# Patient Record
Sex: Female | Born: 1960 | Race: White | Hispanic: No | Marital: Married | Smoking: Never smoker
Health system: Southern US, Community
[De-identification: ages and names within clinical notes are randomized; demographics above are authoritative.]

## PROBLEM LIST (undated history)

## (undated) DIAGNOSIS — K222 Esophageal obstruction: Secondary | ICD-10-CM

## (undated) DIAGNOSIS — C4490 Unspecified malignant neoplasm of skin, unspecified: Secondary | ICD-10-CM

## (undated) DIAGNOSIS — K900 Celiac disease: Secondary | ICD-10-CM

## (undated) DIAGNOSIS — T7840XA Allergy, unspecified, initial encounter: Secondary | ICD-10-CM

## (undated) DIAGNOSIS — K635 Polyp of colon: Secondary | ICD-10-CM

## (undated) DIAGNOSIS — E079 Disorder of thyroid, unspecified: Secondary | ICD-10-CM

## (undated) DIAGNOSIS — K219 Gastro-esophageal reflux disease without esophagitis: Secondary | ICD-10-CM

## (undated) DIAGNOSIS — J45909 Unspecified asthma, uncomplicated: Secondary | ICD-10-CM

## (undated) DIAGNOSIS — K2950 Unspecified chronic gastritis without bleeding: Secondary | ICD-10-CM

## (undated) DIAGNOSIS — F419 Anxiety disorder, unspecified: Secondary | ICD-10-CM

## (undated) DIAGNOSIS — G43909 Migraine, unspecified, not intractable, without status migrainosus: Secondary | ICD-10-CM

## (undated) HISTORY — DX: Allergy, unspecified, initial encounter: T78.40XA

## (undated) HISTORY — DX: Anxiety disorder, unspecified: F41.9

## (undated) HISTORY — DX: Unspecified asthma, uncomplicated: J45.909

## (undated) HISTORY — DX: Migraine, unspecified, not intractable, without status migrainosus: G43.909

## (undated) HISTORY — PX: UPPER GASTROINTESTINAL ENDOSCOPY: SHX188

## (undated) HISTORY — PX: POLYPECTOMY: SHX149

## (undated) HISTORY — DX: Unspecified chronic gastritis without bleeding: K29.50

## (undated) HISTORY — DX: Disorder of thyroid, unspecified: E07.9

## (undated) HISTORY — DX: Celiac disease: K90.0

## (undated) HISTORY — DX: Esophageal obstruction: K22.2

## (undated) HISTORY — DX: Polyp of colon: K63.5

## (undated) HISTORY — DX: Unspecified malignant neoplasm of skin, unspecified: C44.90

## (undated) HISTORY — DX: Gastro-esophageal reflux disease without esophagitis: K21.9

---
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
## 1996-05-28 HISTORY — PX: TUBAL LIGATION: SHX77

## 1999-09-06 ENCOUNTER — Other Ambulatory Visit: Admission: RE | Admit: 1999-09-06 | Discharge: 1999-09-06 | Payer: Self-pay | Admitting: *Deleted

## 2001-08-25 ENCOUNTER — Other Ambulatory Visit: Admission: RE | Admit: 2001-08-25 | Discharge: 2001-08-25 | Payer: Self-pay | Admitting: Obstetrics and Gynecology

## 2004-06-15 ENCOUNTER — Ambulatory Visit: Payer: Self-pay | Admitting: Internal Medicine

## 2009-05-28 HISTORY — PX: COLONOSCOPY: SHX174

## 2010-05-01 LAB — HM COLONOSCOPY: HM Colonoscopy: NEGATIVE

## 2010-05-28 HISTORY — PX: DILATION AND CURETTAGE OF UTERUS: SHX78

## 2010-05-28 HISTORY — PX: ABLATION: SHX5711

## 2010-09-28 LAB — PROTIME-INR

## 2011-03-02 LAB — HM PAP SMEAR: HM Pap smear: NEGATIVE

## 2012-01-31 ENCOUNTER — Ambulatory Visit (INDEPENDENT_AMBULATORY_CARE_PROVIDER_SITE_OTHER): Payer: BC Managed Care – PPO | Admitting: Family Medicine

## 2012-01-31 ENCOUNTER — Encounter: Payer: Self-pay | Admitting: Family Medicine

## 2012-01-31 VITALS — BP 112/62 | HR 72 | Temp 98.0°F | Resp 12 | Ht 65.0 in | Wt 138.0 lb

## 2012-01-31 DIAGNOSIS — K900 Celiac disease: Secondary | ICD-10-CM | POA: Insufficient documentation

## 2012-01-31 DIAGNOSIS — F410 Panic disorder [episodic paroxysmal anxiety] without agoraphobia: Secondary | ICD-10-CM | POA: Insufficient documentation

## 2012-01-31 DIAGNOSIS — J4520 Mild intermittent asthma, uncomplicated: Secondary | ICD-10-CM | POA: Insufficient documentation

## 2012-01-31 DIAGNOSIS — E039 Hypothyroidism, unspecified: Secondary | ICD-10-CM | POA: Insufficient documentation

## 2012-01-31 DIAGNOSIS — J45909 Unspecified asthma, uncomplicated: Secondary | ICD-10-CM

## 2012-01-31 DIAGNOSIS — K219 Gastro-esophageal reflux disease without esophagitis: Secondary | ICD-10-CM

## 2012-01-31 NOTE — Progress Notes (Signed)
  Subjective:    Patient ID: Vicki [name], female    DOB: 02/13/1961, 51 y.o.   MRN: [id number]  HPI Here to establish care. Past history significant for mild intermittent asthma, history of GERD, seasonal allergies, hypothyroidism, and celiac disease. This was diagnosed 2 years ago following endoscopy biopsy. Since going gluten-free she's felt much better. She continues to see gynecologist regularly. Had D&C this past January for heavy bleeding and that is improved. Also his history reported panic attacks and has been able to taper her down her Paxil to only 5 mg and that is currently controlling things well.  Patient nonsmoker. Walks regularly for exercise.  She is married and has 4 children. She has Masters degree in nursing. Has had previous Pneumovax. Tetanus 2004  Family history significant for both parents with alcoholism. Mother with history breast cancer. Father had stroke.     Review of Systems  Constitutional: Negative for fever, activity change, appetite change, fatigue and unexpected weight change.  HENT: Negative for hearing loss, ear pain, sore throat and trouble swallowing.   Eyes: Negative for visual disturbance.  Respiratory: Negative for cough and shortness of breath.   Cardiovascular: Negative for chest pain and palpitations.  Gastrointestinal: Negative for abdominal pain, diarrhea, constipation and blood in stool.  Genitourinary: Negative for dysuria and hematuria.  Musculoskeletal: Negative for myalgias, back pain and arthralgias.  Skin: Negative for rash.  Neurological: Negative for dizziness, syncope and headaches.  Hematological: Negative for adenopathy.  Psychiatric/Behavioral: Negative for confusion and dysphoric mood.       Objective:   Physical Exam  Constitutional: She is oriented to person, place, and time. She appears well-developed and well-nourished.  Neck: Neck supple. No thyromegaly present.  Cardiovascular: Normal rate and regular rhythm.   No  murmur heard. Pulmonary/Chest: Effort normal and breath sounds normal. No respiratory distress. She has no wheezes. She has no rales.  Musculoskeletal: She exhibits no edema.  Neurological: She is alert and oriented to person, place, and time.          Assessment & Plan:  #1 history of mild intermittent asthma. Stable. Continue yearly flu vaccine #2 history of celiac disease. Patient stable on gluten-free diet  #3 history of GERD stable on low-dose Prevacid #4 history of panic disorder stable on low-dose Paxil  #5 hypothyroidism. Recheck TSH

## 2012-02-01 NOTE — Progress Notes (Signed)
Quick Note:  Called and spoke with pt and pt is aware. ______ 

## 2012-03-24 ENCOUNTER — Telehealth: Payer: Self-pay | Admitting: Family Medicine

## 2012-03-24 MED ORDER — PAROXETINE HCL 10 MG PO TABS: 10.0000 mg | ORAL_TABLET | ORAL | Status: DC

## 2012-03-24 MED ORDER — MOMETASONE FUROATE 50 MCG/ACT NA SUSP: 2.0000 | Freq: Every day | NASAL | Status: DC

## 2012-03-24 MED ORDER — TRAMADOL HCL 50 MG PO TABS: 50.0000 mg | ORAL_TABLET | Freq: Four times a day (QID) | ORAL | Status: DC | PRN

## 2012-03-24 MED ORDER — LEVOTHYROXINE SODIUM 25 MCG PO TABS: 25.0000 ug | ORAL_TABLET | Freq: Every day | ORAL | Status: DC

## 2012-03-24 NOTE — Telephone Encounter (Signed)
Pt needs refills on:  mometasone (NASONEX) 50 MCG/ACT nasal spray (BRAND NAME ONLY - has Celiac disease)  traMADol (ULTRAM) 50 MG tablet (BRAND NAME ONLY - has Celiac disease).   Nasonex will probably need PA so let me know on that. Patient is almost out of these 2 meds, so hopes they can be done this week.  Will also need refills on these in next 30 days:  levothyroxine (SYNTHROID, LEVOTHROID) 25 MCG tablet (BRAND NAME ONLY - Celiac disease) PARoxetine (PAXIL) 10 MG tablet (BRAND NAME)  Patient was seen here 01/31/2012 as new pt, so Dr. Elease Hashimoto has not rx'd these before. Pt uses: [facility] in [place].

## 2012-04-02 LAB — HM MAMMOGRAPHY: HM Mammogram: NEGATIVE

## 2012-04-03 ENCOUNTER — Encounter: Payer: Self-pay | Admitting: Family Medicine

## 2012-05-30 ENCOUNTER — Encounter: Payer: Self-pay | Admitting: Family Medicine

## 2012-05-30 ENCOUNTER — Ambulatory Visit (INDEPENDENT_AMBULATORY_CARE_PROVIDER_SITE_OTHER): Payer: BC Managed Care – PPO | Admitting: Family Medicine

## 2012-05-30 VITALS — BP 110/60 | Temp 98.4°F | Wt 138.0 lb

## 2012-05-30 DIAGNOSIS — J019 Acute sinusitis, unspecified: Secondary | ICD-10-CM

## 2012-05-30 MED ORDER — AZITHROMYCIN 250 MG PO TABS: ORAL_TABLET | ORAL | Status: AC

## 2012-05-30 NOTE — Patient Instructions (Addendum)

## 2012-05-30 NOTE — Progress Notes (Signed)
  Subjective:    Patient ID: Vicki [name], female    DOB: 1960-11-10, 52 y.o.   MRN: [id number]  HPI Sinus congestion for over 2 weeks.   patient has intermittent headaches and left maxillary facial pressure. Fleeting vertigo symptoms off and on. She had gastrointestinal bug last week and those symptoms have fully cleared. She's had some fatigue. Intermittent headaches. No cough. Frequent postnasal drip. She's tried over-the-counter things like Mucinex and nasal saline irrigation without much improvement.   Review of Systems As per history of present illness    Objective:   Physical Exam  Constitutional: She appears well-developed and well-nourished.  HENT:  Right Ear: External ear normal.  Left Ear: External ear normal.  Nose: Nose normal.  Mouth/Throat: Oropharynx is clear and moist.  Neck: Neck supple. No thyromegaly present.  Cardiovascular: Normal rate and regular rhythm.   Pulmonary/Chest: Effort normal and breath sounds normal. No respiratory distress. She has no wheezes. She has no rales.          Assessment & Plan:  Acute sinusitis. Zithromax. Continue Mucinex. Followup as needed

## 2012-08-15 ENCOUNTER — Ambulatory Visit (INDEPENDENT_AMBULATORY_CARE_PROVIDER_SITE_OTHER): Payer: BC Managed Care – PPO | Admitting: Family Medicine

## 2012-08-15 ENCOUNTER — Encounter: Payer: Self-pay | Admitting: Family Medicine

## 2012-08-15 VITALS — BP 120/70 | Temp 98.3°F

## 2012-08-15 DIAGNOSIS — J029 Acute pharyngitis, unspecified: Secondary | ICD-10-CM

## 2012-08-15 NOTE — Patient Instructions (Addendum)
Upper Respiratory Infection, Adult An upper respiratory infection (URI) is also known as the common cold. It is often caused by a type of germ (virus). Colds are easily spread (contagious). You can pass it to others by kissing, coughing, sneezing, or drinking out of the same glass. Usually, you get better in 1 or 2 weeks.  HOME CARE   Only take medicine as told by your doctor.  Use a warm mist humidifier or breathe in steam from a hot shower.  Drink enough water and fluids to keep your pee (urine) clear or pale yellow.  Get plenty of rest.  Return to work when your temperature is back to normal or as told by your doctor. You may use a face mask and wash your hands to stop your cold from spreading. GET HELP RIGHT AWAY IF:   After the first few days, you feel you are getting worse.  You have questions about your medicine.  You have chills, shortness of breath, or brown or red spit (mucus).  You have yellow or brown snot (nasal discharge) or pain in the face, especially when you bend forward.  You have a fever, puffy (swollen) neck, pain when you swallow, or white spots in the back of your throat.  You have a bad headache, ear pain, sinus pain, or chest pain.  You have a high-pitched whistling sound when you breathe in and out (wheezing).  You have a lasting cough or cough up blood.  You have sore muscles or a stiff neck. MAKE SURE YOU:   Understand these instructions.  Will watch your condition.  Will get help right away if you are not doing well or get worse. Document Released: 10/31/2007 Document Revised: 08/06/2011 Document Reviewed: 09/18/2010 [facility] Patient Information 2013 [place].

## 2012-08-15 NOTE — Progress Notes (Signed)
  Subjective:    Patient ID: Vicki [name], female    DOB: 02-06-1961, 52 y.o.   MRN: [id number]  HPI Acute visit. Onset of sore throat 1 week ago. Possible referred pain to left ear. She's had some mild nasal congestion but no cough. No wheezing. Increased malaise. No headaches. Denies any skin rash. No nausea or vomiting. No sick contacts. Alleviated with over-the-counter medications somewhat. Sore throat slightly improved compared to earlier in the week.  Past Medical History  Diagnosis Date  . Asthma   . Allergy   . Thyroid disease   . Celiac disease   . Anxiety     hx ?panic disorder   Past Surgical History  Procedure Laterality Date  . Cesarean section      reports that she has never smoked. She does not have any smokeless tobacco history on file. Her alcohol and drug histories are not on file. family history includes Alcohol abuse in her father and mother; Arthritis in her maternal grandmother; Asthma in her paternal aunt; Cancer in her mother; and Stroke in her father.  There is no history of Heart disease. Allergies  Allergen Reactions  . Ceclor (Cefaclor)     hives      Review of Systems  Constitutional: Positive for fatigue. Negative for fever and chills.  HENT: Positive for sore throat. Negative for trouble swallowing.   Respiratory: Negative for cough.   Skin: Negative for rash.  Neurological: Negative for headaches.  Hematological: Negative for adenopathy.       Objective:   Physical Exam  Constitutional: She appears well-developed and well-nourished.  HENT:  Right Ear: External ear normal.  Left Ear: External ear normal.  Mouth/Throat: Oropharynx is clear and moist.  Neck: Neck supple.  Cardiovascular: Normal rate and regular rhythm.   Pulmonary/Chest: Effort normal and breath sounds normal. No respiratory distress. She has no wheezes. She has no rales.  Lymphadenopathy:    She has no cervical adenopathy.  Skin: No rash noted.           Assessment & Plan:  Pharyngitis. Suspect viral. She does not have any fever, exudate, adenopathy to suggest strep. Reassurance and over-the-counter medications as needed.

## 2013-01-09 ENCOUNTER — Ambulatory Visit (INDEPENDENT_AMBULATORY_CARE_PROVIDER_SITE_OTHER): Payer: BC Managed Care – PPO | Admitting: Family Medicine

## 2013-01-09 ENCOUNTER — Encounter: Payer: Self-pay | Admitting: Family Medicine

## 2013-01-09 VITALS — BP 112/60 | HR 81 | Temp 97.8°F | Wt 144.0 lb

## 2013-01-09 DIAGNOSIS — Z8669 Personal history of other diseases of the nervous system and sense organs: Secondary | ICD-10-CM

## 2013-01-09 DIAGNOSIS — E039 Hypothyroidism, unspecified: Secondary | ICD-10-CM

## 2013-01-09 MED ORDER — SUMATRIPTAN SUCCINATE 100 MG PO TABS: 100.0000 mg | ORAL_TABLET | ORAL | Status: DC | PRN

## 2013-01-09 NOTE — Progress Notes (Signed)
  Subjective:    Patient ID: Vicki [name], female    DOB: Mar 26, 1961, 52 y.o.   MRN: [id number]  HPI Patient seen for the following issues:  Reported hypothyroidism, though apparently she was started in her teenage years on low-dose thyroid replacement possibly for obesity. Most recent TSH last year was normal. She takes low dose of 25 mcg daily.  She wants to consider discontinuing.  History of migraine headaches. She had headache last week that lasted about 48 hours. This started right occipital and spread right frontal. Throbbing headache with nausea but no vomiting. She had photophobia. She took Aleve and tramadol and eventually her headache improved. She would like to consider other therapies for future headache. She is not aware of specific trigger.  Past Medical History  Diagnosis Date  . Asthma   . Allergy   . Thyroid disease   . Celiac disease   . Anxiety     hx ?panic disorder   Past Surgical History  Procedure Laterality Date  . Cesarean section      reports that she has never smoked. She does not have any smokeless tobacco history on file. Her alcohol and drug histories are not on file. family history includes Alcohol abuse in her father and mother; Arthritis in her maternal grandmother; Asthma in her paternal aunt; Cancer in her mother; Stroke in her father. There is no history of Heart disease. Allergies  Allergen Reactions  . Ceclor [Cefaclor]     hives      Review of Systems  Constitutional: Negative for fever, chills and appetite change.  Respiratory: Negative for cough and shortness of breath.   Cardiovascular: Negative for chest pain and leg swelling.  Neurological: Negative for dizziness and weakness.       Objective:   Physical Exam  Constitutional: She is oriented to person, place, and time. She appears well-developed and well-nourished.  Neck: Neck supple. No thyromegaly present.  Cardiovascular: Normal rate and regular rhythm.    Pulmonary/Chest: Effort normal and breath sounds normal. No respiratory distress. She has no wheezes. She has no rales.  Musculoskeletal: She exhibits no edema.  Neurological: She is alert and oriented to person, place, and time. No cranial nerve deficit.          Assessment & Plan:  #1 migraine headaches. We discussed possible triggers. Trial of Imitrex 100 mg tablet as needed. Avoid concomitant use with tramadol especially with her being on low-dose Paxil (to avoid Serotonin Syndrome). #2 questionable hypothyroidism. Discontinue Synthroid. Recheck TSH in 3 months and if normal at that point will d/c for good

## 2013-01-09 NOTE — Patient Instructions (Signed)
Migraine Headache A migraine headache is an intense, throbbing pain on one or both sides of your head. A migraine can last for 30 minutes to several hours. CAUSES  The exact cause of a migraine headache is not always known. However, a migraine may be caused when nerves in the brain become irritated and release chemicals that cause inflammation. This causes pain. SYMPTOMS  Pain on one or both sides of your head.  Pulsating or throbbing pain.  Severe pain that prevents daily activities.  Pain that is aggravated by any physical activity.  Nausea, vomiting, or both.  Dizziness.  Pain with exposure to bright lights, loud noises, or activity.  General sensitivity to bright lights, loud noises, or smells. Before you get a migraine, you may get warning signs that a migraine is coming (aura). An aura may include:  Seeing flashing lights.  Seeing bright spots, halos, or zig-zag lines.  Having tunnel vision or blurred vision.  Having feelings of numbness or tingling.  Having trouble talking.  Having muscle weakness. MIGRAINE TRIGGERS  Alcohol.  Smoking.  Stress.  Menstruation.  Aged cheeses.  Foods or drinks that contain nitrates, glutamate, aspartame, or tyramine.  Lack of sleep.  Chocolate.  Caffeine.  Hunger.  Physical exertion.  Fatigue.  Medicines used to treat chest pain (nitroglycerine), birth control pills, estrogen, and some blood pressure medicines. DIAGNOSIS  A migraine headache is often diagnosed based on:  Symptoms.  Physical examination.  A CT scan or MRI of your head. TREATMENT Medicines may be given for pain and nausea. Medicines can also be given to help prevent recurrent migraines.  HOME CARE INSTRUCTIONS  Only take over-the-counter or prescription medicines for pain or discomfort as directed by your caregiver. The use of long-term narcotics is not recommended.  Lie down in a dark, quiet room when you have a migraine.  Keep a journal  to find out what may trigger your migraine headaches. For example, write down:  What you eat and drink.  How much sleep you get.  Any change to your diet or medicines.  Limit alcohol consumption.  Quit smoking if you smoke.  Get 7 to 9 hours of sleep, or as recommended by your caregiver.  Limit stress.  Keep lights dim if bright lights bother you and make your migraines worse. SEEK IMMEDIATE MEDICAL CARE IF:   Your migraine becomes severe.  You have a fever.  You have a stiff neck.  You have vision loss.  You have muscular weakness or loss of muscle control.  You start losing your balance or have trouble walking.  You feel faint or pass out.  You have severe symptoms that are different from your first symptoms. MAKE SURE YOU:   Understand these instructions.  Will watch your condition.  Will get help right away if you are not doing well or get worse. Document Released: 05/14/2005 Document Revised: 08/06/2011 Document Reviewed: 05/04/2011 [facility] Patient Information 2014 [place], [place].  Discontinue levothyroxine and let's plan follow up TSH in 3 months.

## 2013-01-29 ENCOUNTER — Ambulatory Visit (INDEPENDENT_AMBULATORY_CARE_PROVIDER_SITE_OTHER): Payer: BC Managed Care – PPO | Admitting: Family Medicine

## 2013-01-29 ENCOUNTER — Encounter: Payer: Self-pay | Admitting: Family Medicine

## 2013-01-29 VITALS — BP 112/60 | HR 83 | Temp 98.0°F | Wt 142.0 lb

## 2013-01-29 DIAGNOSIS — H6980 Other specified disorders of Eustachian tube, unspecified ear: Secondary | ICD-10-CM

## 2013-01-29 DIAGNOSIS — H6981 Other specified disorders of Eustachian tube, right ear: Secondary | ICD-10-CM

## 2013-01-29 NOTE — Patient Instructions (Addendum)
Get back on Allegra one daily and continue with the Nasonex Follow up immediately for any progressive hearing loss or increased vertigo.

## 2013-01-29 NOTE — Progress Notes (Signed)
  Subjective:    Patient ID: Vicki [name], female    DOB: [birth year], 52 y.o.   MRN: [id number]  HPI Patient seen with right ear pain. Possibly some mild hearing changes that somewhat wax and wane-and she thinks correlate with degree of nasal congestion. No vertigo. Mild tenderness. She's had increased sinus drainage. Denies any ear drainage. No fevers or chills. She thinks the hearing changes right ear may be related to fluctuations in drainage. Has previously used Singulair which did not seem to help her allergies.  Past Medical History  Diagnosis Date  . Asthma   . Allergy   . Thyroid disease   . Celiac disease   . Anxiety     hx ?panic disorder   Past Surgical History  Procedure Laterality Date  . Cesarean section      reports that she has never smoked. She does not have any smokeless tobacco history on file. Her alcohol and drug histories are not on file. family history includes Alcohol abuse in her father and mother; Arthritis in her maternal grandmother; Asthma in her paternal aunt; Cancer in her mother; Stroke in her father. There is no history of Heart disease. Allergies  Allergen Reactions  . Ceclor [Cefaclor]     hives      Review of Systems  Constitutional: Negative for fever and chills.  HENT: Positive for ear pain, congestion, rhinorrhea and sinus pressure.   Neurological: Negative for headaches.       Objective:   Physical Exam  Constitutional: She is oriented to person, place, and time. She appears well-developed and well-nourished.  HENT:  Right Ear: External ear normal.  Left Ear: External ear normal.  Neck: Neck supple. No thyromegaly present.  Cardiovascular: Normal rate and regular rhythm.   Pulmonary/Chest: Effort normal and breath sounds normal. No respiratory distress. She has no wheezes. She has no rales.  Neurological: She is alert and oriented to person, place, and time. No cranial nerve deficit.          Assessment & Plan:   Probable right eustachian tube dysfunction. She does not describe vertigo or sudden hearing changes to suggest acute labyrinthitis. She does not have evidence for cerumen impaction. No evidence for acute infection. No visible effusion. She'll get back on regular Allegra and Nasonex for allergy symptoms. Followup promptly for hearing changes.

## 2013-03-20 ENCOUNTER — Ambulatory Visit (INDEPENDENT_AMBULATORY_CARE_PROVIDER_SITE_OTHER): Payer: BC Managed Care – PPO | Admitting: Family Medicine

## 2013-03-20 ENCOUNTER — Encounter: Payer: Self-pay | Admitting: Family Medicine

## 2013-03-20 VITALS — BP 126/70 | HR 69 | Temp 98.1°F | Wt 146.0 lb

## 2013-03-20 DIAGNOSIS — J019 Acute sinusitis, unspecified: Secondary | ICD-10-CM

## 2013-03-20 MED ORDER — AMOXICILLIN-POT CLAVULANATE 875-125 MG PO TABS: 1.0000 | ORAL_TABLET | Freq: Two times a day (BID) | ORAL | Status: DC

## 2013-03-20 NOTE — Progress Notes (Signed)
  Subjective:    Patient ID: Vicki [name], female    DOB: 1960-08-11, 52 y.o.   MRN: [id number]  HPI Here for 3 days of fever to 99.8 degrees, left frontal HA, sinus pressure, and blowing green mucus from the nose. No cough.    Review of Systems  Constitutional: Positive for fever.  HENT: Positive for congestion, postnasal drip and sinus pressure.   Eyes: Negative.   Respiratory: Negative.        Objective:   Physical Exam  Constitutional: She appears well-developed and well-nourished.  HENT:  Right Ear: External ear normal.  Left Ear: External ear normal.  Nose: Nose normal.  Mouth/Throat: Oropharynx is clear and moist.  Eyes: Conjunctivae are normal.  Pulmonary/Chest: Effort normal and breath sounds normal.  Lymphadenopathy:    She has no cervical adenopathy.          Assessment & Plan:  Add Mucinex .

## 2013-04-01 ENCOUNTER — Other Ambulatory Visit: Payer: Self-pay | Admitting: Family Medicine

## 2013-04-16 ENCOUNTER — Other Ambulatory Visit (INDEPENDENT_AMBULATORY_CARE_PROVIDER_SITE_OTHER): Payer: BC Managed Care – PPO

## 2013-04-16 DIAGNOSIS — E039 Hypothyroidism, unspecified: Secondary | ICD-10-CM

## 2013-09-08 ENCOUNTER — Other Ambulatory Visit: Payer: Self-pay | Admitting: Family Medicine

## 2013-12-07 ENCOUNTER — Other Ambulatory Visit: Payer: Self-pay | Admitting: Family Medicine

## 2013-12-28 ENCOUNTER — Other Ambulatory Visit: Payer: Self-pay | Admitting: Family Medicine

## 2014-02-17 ENCOUNTER — Other Ambulatory Visit: Payer: Self-pay | Admitting: Family Medicine

## 2014-03-06 ENCOUNTER — Other Ambulatory Visit: Payer: Self-pay | Admitting: Family Medicine

## 2014-03-23 ENCOUNTER — Other Ambulatory Visit (INDEPENDENT_AMBULATORY_CARE_PROVIDER_SITE_OTHER): Payer: BC Managed Care – PPO

## 2014-03-23 DIAGNOSIS — Z0000 Encounter for general adult medical examination without abnormal findings: Secondary | ICD-10-CM

## 2014-03-23 LAB — BASIC METABOLIC PANEL WITH GFR
BUN: 10 mg/dL (ref 6–23)
CO2: 29 meq/L (ref 19–32)
Calcium: 9.3 mg/dL (ref 8.4–10.5)
Chloride: 103 meq/L (ref 96–112)
Creatinine, Ser: 0.8 mg/dL (ref 0.4–1.2)
GFR: 81.59 mL/min
Glucose, Bld: 73 mg/dL (ref 70–99)
Potassium: 3.8 meq/L (ref 3.5–5.1)
Sodium: 137 meq/L (ref 135–145)

## 2014-03-23 LAB — CBC WITH DIFFERENTIAL/PLATELET
Basophils Absolute: 0 10*3/uL (ref 0.0–0.1)
Basophils Relative: 0.9 % (ref 0.0–3.0)
EOS ABS: 0.4 10*3/uL (ref 0.0–0.7)
Eosinophils Relative: 7.2 % — ABNORMAL HIGH (ref 0.0–5.0)
HCT: 35.9 % — ABNORMAL LOW (ref 36.0–46.0)
Hemoglobin: 11.8 g/dL — ABNORMAL LOW (ref 12.0–15.0)
LYMPHS ABS: 1.4 10*3/uL (ref 0.7–4.0)
Lymphocytes Relative: 27.8 % (ref 12.0–46.0)
MCHC: 32.9 g/dL (ref 30.0–36.0)
MCV: 93.7 fl (ref 78.0–100.0)
MONO ABS: 0.4 10*3/uL (ref 0.1–1.0)
Monocytes Relative: 7.3 % (ref 3.0–12.0)
Neutro Abs: 2.9 10*3/uL (ref 1.4–7.7)
Neutrophils Relative %: 56.8 % (ref 43.0–77.0)
PLATELETS: 207 10*3/uL (ref 150.0–400.0)
RBC: 3.83 Mil/uL — ABNORMAL LOW (ref 3.87–5.11)
RDW: 13.8 % (ref 11.5–15.5)
WBC: 5.1 10*3/uL (ref 4.0–10.5)

## 2014-03-23 LAB — POCT URINALYSIS DIPSTICK
Bilirubin, UA: NEGATIVE
Blood, UA: NEGATIVE
Glucose, UA: NEGATIVE
Ketones, UA: NEGATIVE
Leukocytes, UA: NEGATIVE
Nitrite, UA: NEGATIVE
Protein, UA: NEGATIVE
Spec Grav, UA: <=1.005
Urobilinogen, UA: 0.2
pH, UA: 7

## 2014-03-23 LAB — LIPID PANEL
Cholesterol: 184 mg/dL (ref 0–200)
HDL: 71.6 mg/dL (ref >39.00)
LDL CALC: 105 mg/dL — AB (ref 0–99)
NonHDL: 112.4
Total CHOL/HDL Ratio: 3
Triglycerides: 39 mg/dL (ref 0.0–149.0)
VLDL: 7.8 mg/dL (ref 0.0–40.0)

## 2014-03-23 LAB — HEPATIC FUNCTION PANEL
ALT: 19 U/L (ref 0–35)
AST: 24 U/L (ref 0–37)
Albumin: 3.6 g/dL (ref 3.5–5.2)
Alkaline Phosphatase: 36 U/L — ABNORMAL LOW (ref 39–117)
Bilirubin, Direct: 0.1 mg/dL (ref 0.0–0.3)
Total Bilirubin: 0.6 mg/dL (ref 0.2–1.2)
Total Protein: 6.7 g/dL (ref 6.0–8.3)

## 2014-03-23 LAB — TSH: TSH: 3.52 u[IU]/mL (ref 0.35–4.50)

## 2014-03-25 ENCOUNTER — Other Ambulatory Visit: Payer: BC Managed Care – PPO

## 2014-03-31 ENCOUNTER — Ambulatory Visit (INDEPENDENT_AMBULATORY_CARE_PROVIDER_SITE_OTHER): Payer: BC Managed Care – PPO | Admitting: Family Medicine

## 2014-03-31 ENCOUNTER — Encounter: Payer: Self-pay | Admitting: Family Medicine

## 2014-03-31 VITALS — BP 118/66 | HR 70 | Temp 98.4°F | Ht 65.0 in | Wt 148.0 lb

## 2014-03-31 DIAGNOSIS — D649 Anemia, unspecified: Secondary | ICD-10-CM

## 2014-03-31 DIAGNOSIS — Z0000 Encounter for general adult medical examination without abnormal findings: Secondary | ICD-10-CM

## 2014-03-31 NOTE — Progress Notes (Signed)
Pre visit review using our clinic review tool, if applicable. No additional management support is needed unless otherwise documented below in the visit note. 

## 2014-03-31 NOTE — Progress Notes (Signed)
   Subjective:    Patient ID: Vicki [name], female    DOB: 07/31/1960, 53 y.o.   MRN: [id number]  HPI Patient is seen for complete physical. She sees gynecologist regularly and gets mammograms and Pap smears through them.  Tetanus less than 10 years ago. Declines flu vaccine. Colonoscopy 2011.  Never smoked. Celiac disease in this is been stable with avoidance of glutens. She has long history of chronic anxiety with panic disorder currently controlled on low-dose Paxil 5 mg daily. She has infrequent migraine headaches. Uses Imitrex.  Past Medical History  Diagnosis Date  . Asthma   . Allergy   . Thyroid disease   . Celiac disease   . Anxiety     hx ?panic disorder   Past Surgical History  Procedure Laterality Date  . Cesarean section      reports that she has never smoked. She has never used smokeless tobacco. She reports that she does not drink alcohol or use illicit drugs. family history includes Alcohol abuse in her father and mother; Arthritis in her maternal grandmother; Asthma in her paternal aunt; Cancer in her mother; Cancer (age of onset: 49) in her sister; Stroke in her father. There is no history of Heart disease. Allergies  Allergen Reactions  . Ceclor [Cefaclor]     hives      Review of Systems  Constitutional: Negative for fever, activity change, appetite change, fatigue and unexpected weight change.  HENT: Negative for ear pain, hearing loss, sore throat and trouble swallowing.   Eyes: Negative for visual disturbance.  Respiratory: Negative for cough and shortness of breath.   Cardiovascular: Negative for chest pain and palpitations.  Gastrointestinal: Negative for abdominal pain, diarrhea, constipation and blood in stool.  Genitourinary: Negative for dysuria and hematuria.  Musculoskeletal: Positive for arthralgias (Mild arthralgias involving handsDIP and PIP joints). Negative for myalgias and back pain.  Skin: Negative for rash.  Neurological: Negative  for dizziness, syncope and headaches.  Hematological: Negative for adenopathy.  Psychiatric/Behavioral: Negative for confusion and dysphoric mood.       Objective:   Physical Exam  Constitutional: She is oriented to person, place, and time. She appears well-developed and well-nourished.  HENT:  Head: Normocephalic and atraumatic.  Eyes: EOM are normal. Pupils are equal, round, and reactive to light.  Neck: Normal range of motion. Neck supple. No thyromegaly present.  Cardiovascular: Normal rate, regular rhythm and normal heart sounds.   No murmur heard. Pulmonary/Chest: Breath sounds normal. No respiratory distress. She has no wheezes. She has no rales.  Abdominal: Soft. Bowel sounds are normal. She exhibits no distension and no mass. There is no tenderness. There is no rebound and no guarding.  Genitourinary:  Per GYN  Musculoskeletal: Normal range of motion. She exhibits no edema.  Lymphadenopathy:    She has no cervical adenopathy.  Neurological: She is alert and oriented to person, place, and time. She displays normal reflexes. No cranial nerve deficit.  Skin: No rash noted.  Psychiatric: She has a normal mood and affect. Her behavior is normal. Judgment and thought content normal.          Assessment & Plan:  Complete physical. Labs reviewed. Nonspecific mild normocytic anemia with hemoglobin 11.8. Recheck in one month. If still low at that point consider additional studies. Discussed weightbearing exercise. She'll continue GYN follow-up and getting mammograms through their office. Confirm date of last tetanus. She declines flu vaccine.

## 2014-04-04 ENCOUNTER — Other Ambulatory Visit: Payer: Self-pay | Admitting: Family Medicine

## 2014-05-03 ENCOUNTER — Other Ambulatory Visit: Payer: BC Managed Care – PPO

## 2014-05-05 ENCOUNTER — Other Ambulatory Visit (INDEPENDENT_AMBULATORY_CARE_PROVIDER_SITE_OTHER): Payer: BC Managed Care – PPO

## 2014-05-05 DIAGNOSIS — D649 Anemia, unspecified: Secondary | ICD-10-CM

## 2014-05-05 LAB — CBC WITH DIFFERENTIAL/PLATELET
BASOS ABS: 0 10*3/uL (ref 0.0–0.1)
Basophils Relative: 0.5 % (ref 0.0–3.0)
Eosinophils Absolute: 0.5 10*3/uL (ref 0.0–0.7)
Eosinophils Relative: 9.2 % — ABNORMAL HIGH (ref 0.0–5.0)
HEMATOCRIT: 36.2 % (ref 36.0–46.0)
HEMOGLOBIN: 11.9 g/dL — AB (ref 12.0–15.0)
Lymphocytes Relative: 28.3 % (ref 12.0–46.0)
Lymphs Abs: 1.5 10*3/uL (ref 0.7–4.0)
MCHC: 33 g/dL (ref 30.0–36.0)
MCV: 94 fl (ref 78.0–100.0)
MONO ABS: 0.4 10*3/uL (ref 0.1–1.0)
MONOS PCT: 6.9 % (ref 3.0–12.0)
Neutro Abs: 3 10*3/uL (ref 1.4–7.7)
Neutrophils Relative %: 55.1 % (ref 43.0–77.0)
PLATELETS: 204 10*3/uL (ref 150.0–400.0)
RBC: 3.85 Mil/uL — ABNORMAL LOW (ref 3.87–5.11)
RDW: 13 % (ref 11.5–15.5)
WBC: 5.4 10*3/uL (ref 4.0–10.5)

## 2014-05-31 ENCOUNTER — Other Ambulatory Visit: Payer: Self-pay | Admitting: Family Medicine

## 2014-06-28 ENCOUNTER — Ambulatory Visit: Payer: Self-pay | Admitting: Family Medicine

## 2014-06-28 ENCOUNTER — Ambulatory Visit (INDEPENDENT_AMBULATORY_CARE_PROVIDER_SITE_OTHER): Payer: BLUE CROSS/BLUE SHIELD | Admitting: Family Medicine

## 2014-06-28 DIAGNOSIS — Z23 Encounter for immunization: Secondary | ICD-10-CM

## 2014-09-29 ENCOUNTER — Encounter: Payer: Self-pay | Admitting: Family Medicine

## 2014-09-29 ENCOUNTER — Ambulatory Visit (INDEPENDENT_AMBULATORY_CARE_PROVIDER_SITE_OTHER): Payer: BLUE CROSS/BLUE SHIELD | Admitting: Family Medicine

## 2014-09-29 VITALS — BP 120/70 | HR 82 | Temp 97.7°F | Wt 147.0 lb

## 2014-09-29 DIAGNOSIS — J0100 Acute maxillary sinusitis, unspecified: Secondary | ICD-10-CM | POA: Diagnosis not present

## 2014-09-29 MED ORDER — AMOXICILLIN-POT CLAVULANATE 875-125 MG PO TABS: 1.0000 | ORAL_TABLET | Freq: Two times a day (BID) | ORAL | Status: DC

## 2014-09-29 NOTE — Progress Notes (Signed)
   Subjective:    Patient ID: Vicki [name], female    DOB: 04-02-1961, 54 y.o.   MRN: [id number]  HPI Acute visit. Patient seen with over 2 week history sinus congestion maxillary region. Headaches and fatigue. She's had some nausea without vomiting. Thick yellowish drainage. She's tried multiple over-the-counter things including saline irrigation, Nasonex, and decongestants without improvement. Allergy to Ceclor but has tolerated Augmentin without difficulties in the past.  Past Medical History  Diagnosis Date  . Asthma   . Allergy   . Thyroid disease   . Celiac disease   . Anxiety     hx ?panic disorder   Past Surgical History  Procedure Laterality Date  . Cesarean section      reports that she has never smoked. She has never used smokeless tobacco. She reports that she does not drink alcohol or use illicit drugs. family history includes Alcohol abuse in her father and mother; Arthritis in her maternal grandmother; Asthma in her paternal aunt; Cancer in her mother; Cancer (age of onset: 69) in her sister; Stroke in her father. There is no history of Heart disease. Allergies  Allergen Reactions  . Ceclor [Cefaclor]     hives      Review of Systems  Constitutional: Positive for fatigue. Negative for fever and chills.  HENT: Positive for congestion and sinus pressure.   Respiratory: Negative for cough.   Neurological: Positive for headaches.       Objective:   Physical Exam  Constitutional: She appears well-developed and well-nourished.  HENT:  Head: Normocephalic and atraumatic.  Right Ear: External ear normal.  Neck: Neck supple.  Cardiovascular: Normal rate and regular rhythm.   Pulmonary/Chest: Effort normal and breath sounds normal. No respiratory distress. She has no wheezes. She has no rales.  Lymphadenopathy:    She has no cervical adenopathy.          Assessment & Plan:  Acute bilateral maxillary sinusitis. Augmentin 875 mg twice daily for 10 days.  Over-the-counter decongestants as tolerated. Follow-up as needed

## 2014-09-29 NOTE — Patient Instructions (Signed)

## 2014-09-29 NOTE — Progress Notes (Signed)
Pre visit review using our clinic review tool, if applicable. No additional management support is needed unless otherwise documented below in the visit note. 

## 2014-10-24 ENCOUNTER — Other Ambulatory Visit: Payer: Self-pay | Admitting: Family Medicine

## 2014-11-02 ENCOUNTER — Telehealth: Payer: Self-pay

## 2014-11-02 NOTE — Telephone Encounter (Signed)
Spoke with pt. She will call [facility] to schedule.

## 2015-02-11 ENCOUNTER — Encounter: Payer: Self-pay | Admitting: Family Medicine

## 2015-02-11 ENCOUNTER — Ambulatory Visit (INDEPENDENT_AMBULATORY_CARE_PROVIDER_SITE_OTHER): Payer: BLUE CROSS/BLUE SHIELD | Admitting: Family Medicine

## 2015-02-11 VITALS — BP 122/62 | HR 70 | Temp 98.5°F | Ht 65.0 in | Wt 156.0 lb

## 2015-02-11 DIAGNOSIS — M545 Low back pain, unspecified: Secondary | ICD-10-CM

## 2015-02-11 MED ORDER — CYCLOBENZAPRINE HCL 5 MG PO TABS: ORAL_TABLET | ORAL | Status: DC

## 2015-02-11 NOTE — Progress Notes (Signed)
   Subjective:    Patient ID: Vicki [name], female    DOB: February 25, 1961, 54 y.o.   MRN: [id number]  HPI Patient seen with low back pain. Onset about 9 days ago. This followed significant amount of yard work. She does not recall any specific injury. Location is left lower lumbar area greater than right. No radiculopathy symptoms. Dull achy pain worse with back flexion. She went for muscle massage and this made things worse. She's tried Aleve, Motrin, Ultram, ice, heat, and topicals all with minimal improvement. She feels that she has some muscle spasm intermittently. No lower extremity numbness or weakness.  Past Medical History  Diagnosis Date  . Asthma   . Allergy   . Thyroid disease   . Celiac disease   . Anxiety     hx ?panic disorder   Past Surgical History  Procedure Laterality Date  . Cesarean section      reports that she has never smoked. She has never used smokeless tobacco. She reports that she does not drink alcohol or use illicit drugs. family history includes Alcohol abuse in her father and mother; Arthritis in her maternal grandmother; Asthma in her paternal aunt; Cancer in her mother; Cancer (age of onset: 56) in her sister; Stroke in her father. There is no history of Heart disease. Allergies  Allergen Reactions  . Ceclor [Cefaclor]     hives      Review of Systems  Constitutional: Negative for appetite change and unexpected weight change.  Gastrointestinal: Negative for abdominal pain.  Musculoskeletal: Positive for back pain.  Neurological: Negative for weakness and numbness.       Objective:   Physical Exam  Constitutional: She appears well-developed and well-nourished.  Cardiovascular: Normal rate and regular rhythm.   Pulmonary/Chest: Effort normal and breath sounds normal. No respiratory distress. She has no wheezes. She has no rales.  Musculoskeletal: She exhibits no edema.  Straight leg raises are negative bilaterally. She has some minimal  tenderness left lower lumbar region. No spinal tenderness  Neurological:  Symmetric reflexes knee and ankle bilaterally. Full-strength.          Assessment & Plan:  Lower lumbar back pain. Mostly left-sided. Nonfocal exam. Stretches given. Try Flexeril 5 mg 1-2 daily at bedtime. Continue over-the-counter anti-inflammatory. Consider physical therapy if no better in 1-2 weeks

## 2015-02-11 NOTE — Progress Notes (Signed)
Pre visit review using our clinic review tool, if applicable. No additional management support is needed unless otherwise documented below in the visit note. 

## 2015-02-11 NOTE — Patient Instructions (Signed)
Low Back Sprain with Rehab  A sprain is an injury in which a ligament is torn. The ligaments of the lower back are vulnerable to sprains. However, they are strong and require great force to be injured. These ligaments are important for stabilizing the spinal column. Sprains are classified into three categories. Grade 1 sprains cause pain, but the tendon is not lengthened. Grade 2 sprains include a lengthened ligament, due to the ligament being stretched or partially ruptured. With grade 2 sprains there is still function, although the function may be decreased. Grade 3 sprains involve a complete tear of the tendon or muscle, and function is usually impaired. SYMPTOMS   Severe pain in the lower back.  Sometimes, a feeling of a "pop," "snap," or tear, at the time of injury.  Tenderness and sometimes swelling at the injury site.  Uncommonly, bruising (contusion) within 48 hours of injury.  Muscle spasms in the back. CAUSES  Low back sprains occur when a force is placed on the ligaments that is greater than they can handle. Common causes of injury include:  Performing a stressful act while off-balance.  Repetitive stressful activities that involve movement of the lower back.  Direct hit (trauma) to the lower back. RISK INCREASES WITH:  Contact sports (football, wrestling).  Collisions (major skiing accidents).  Sports that require throwing or lifting (baseball, weightlifting).  Sports involving twisting of the spine (gymnastics, diving, tennis, golf).  Poor strength and flexibility.  Inadequate protection.  Previous back injury or surgery (especially fusion). PREVENTION  Wear properly fitted and padded protective equipment.  Warm up and stretch properly before activity.  Allow for adequate recovery between workouts.  Maintain physical fitness:  Strength, flexibility, and endurance.  Cardiovascular fitness.  Maintain a healthy body weight. PROGNOSIS  If treated  properly, low back sprains usually heal with non-surgical treatment. The length of time for healing depends on the severity of the injury.  RELATED COMPLICATIONS   Recurring symptoms, resulting in a chronic problem.  Chronic inflammation and pain in the low back.  Delayed healing or resolution of symptoms, especially if activity is resumed too soon.  Prolonged impairment.  Unstable or arthritic joints of the low back. TREATMENT  Treatment first involves the use of ice and medicine, to reduce pain and inflammation. The use of strengthening and stretching exercises may help reduce pain with activity. These exercises may be performed at home or with a therapist. Severe injuries may require referral to a therapist for further evaluation and treatment, such as ultrasound. Your caregiver may advise that you wear a back brace or corset, to help reduce pain and discomfort. Often, prolonged bed rest results in greater harm then benefit. Corticosteroid injections may be recommended. However, these should be reserved for the most serious cases. It is important to avoid using your back when lifting objects. At night, sleep on your back on a firm mattress, with a pillow placed under your knees. If non-surgical treatment is unsuccessful, surgery may be needed.  MEDICATION   If pain medicine is needed, nonsteroidal anti-inflammatory medicines (aspirin and ibuprofen), or other minor pain relievers (acetaminophen), are often advised.  Do not take pain medicine for 7 days before surgery.  Prescription pain relievers may be given, if your caregiver thinks they are needed. Use only as directed and only as much as you need.  Ointments applied to the skin may be helpful.  Corticosteroid injections may be given by your caregiver. These injections should be reserved for the most serious cases,   because they may only be given a certain number of times. HEAT AND COLD  Cold treatment (icing) should be applied for 10  to 15 minutes every 2 to 3 hours for inflammation and pain, and immediately after activity that aggravates your symptoms. Use ice packs or an ice massage.  Heat treatment may be used before performing stretching and strengthening activities prescribed by your caregiver, physical therapist, or athletic trainer. Use a heat pack or a warm water soak. SEEK MEDICAL CARE IF:   Symptoms get worse or do not improve in 2 to 4 weeks, despite treatment.  You develop numbness or weakness in either leg.  You lose bowel or bladder function.  Any of the following occur after surgery: fever, increased pain, swelling, redness, drainage of fluids, or bleeding in the affected area.  New, unexplained symptoms develop. (Drugs used in treatment may produce side effects.) EXERCISES  RANGE OF MOTION (ROM) AND STRETCHING EXERCISES - Low Back Sprain Most people with lower back pain will find that their symptoms get worse with excessive bending forward (flexion) or arching at the lower back (extension). The exercises that will help resolve your symptoms will focus on the opposite motion.  Your physician, physical therapist or athletic trainer will help you determine which exercises will be most helpful to resolve your lower back pain. Do not complete any exercises without first consulting with your caregiver. Discontinue any exercises which make your symptoms worse, until you speak to your caregiver. If you have pain, numbness or tingling which travels down into your buttocks, leg or foot, the goal of the therapy is for these symptoms to move closer to your back and eventually resolve. Sometimes, these leg symptoms will get better, but your lower back pain may worsen. This is often an indication of progress in your rehabilitation. Be very alert to any changes in your symptoms and the activities in which you participated in the 24 hours prior to the change. Sharing this information with your caregiver will allow him or her to  most efficiently treat your condition. These exercises may help you when beginning to rehabilitate your injury. Your symptoms may resolve with or without further involvement from your physician, physical therapist or athletic trainer. While completing these exercises, remember:   Restoring tissue flexibility helps normal motion to return to the joints. This allows healthier, less painful movement and activity.  An effective stretch should be held for at least 30 seconds.  A stretch should never be painful. You should only feel a gentle lengthening or release in the stretched tissue. FLEXION RANGE OF MOTION AND STRETCHING EXERCISES: STRETCH - Flexion, Single Knee to Chest   Lie on a firm bed or floor with both legs extended in front of you.  Keeping one leg in contact with the floor, bring your opposite knee to your chest. Hold your leg in place by either grabbing behind your thigh or at your knee.  Pull until you feel a gentle stretch in your low back. Hold __________ seconds.  Slowly release your grasp and repeat the exercise with the opposite side. Repeat __________ times. Complete this exercise __________ times per day.  STRETCH - Flexion, Double Knee to Chest  Lie on a firm bed or floor with both legs extended in front of you.  Keeping one leg in contact with the floor, bring your opposite knee to your chest.  Tense your stomach muscles to support your back and then lift your other knee to your chest. Hold your legs   in place by either grabbing behind your thighs or at your knees.  Pull both knees toward your chest until you feel a gentle stretch in your low back. Hold __________ seconds.  Tense your stomach muscles and slowly return one leg at a time to the floor. Repeat __________ times. Complete this exercise __________ times per day.  STRETCH - Low Trunk Rotation  Lie on a firm bed or floor. Keeping your legs in front of you, bend your knees so they are both pointed toward the  ceiling and your feet are flat on the floor.  Extend your arms out to the side. This will stabilize your upper body by keeping your shoulders in contact with the floor.  Gently and slowly drop both knees together to one side until you feel a gentle stretch in your low back. Hold for __________ seconds.  Tense your stomach muscles to support your lower back as you bring your knees back to the starting position. Repeat the exercise to the other side. Repeat __________ times. Complete this exercise __________ times per day  EXTENSION RANGE OF MOTION AND FLEXIBILITY EXERCISES: STRETCH - Extension, Prone on Elbows   Lie on your stomach on the floor, a bed will be too soft. Place your palms about shoulder width apart and at the height of your head.  Place your elbows under your shoulders. If this is too painful, stack pillows under your chest.  Allow your body to relax so that your hips drop lower and make contact more completely with the floor.  Hold this position for __________ seconds.  Slowly return to lying flat on the floor. Repeat __________ times. Complete this exercise __________ times per day.  RANGE OF MOTION - Extension, Prone Press Ups  Lie on your stomach on the floor, a bed will be too soft. Place your palms about shoulder width apart and at the height of your head.  Keeping your back as relaxed as possible, slowly straighten your elbows while keeping your hips on the floor. You may adjust the placement of your hands to maximize your comfort. As you gain motion, your hands will come more underneath your shoulders.  Hold this position __________ seconds.  Slowly return to lying flat on the floor. Repeat __________ times. Complete this exercise __________ times per day.  RANGE OF MOTION- Quadruped, Neutral Spine   Assume a hands and knees position on a firm surface. Keep your hands under your shoulders and your knees under your hips. You may place padding under your knees for  comfort.  Drop your head and point your tailbone toward the ground below you. This will round out your lower back like an angry cat. Hold this position for __________ seconds.  Slowly lift your head and release your tail bone so that your back sags into a large arch, like an old horse.  Hold this position for __________ seconds.  Repeat this until you feel limber in your low back.  Now, find your "sweet spot." This will be the most comfortable position somewhere between the two previous positions. This is your neutral spine. Once you have found this position, tense your stomach muscles to support your low back.  Hold this position for __________ seconds. Repeat __________ times. Complete this exercise __________ times per day.  STRENGTHENING EXERCISES - Low Back Sprain These exercises may help you when beginning to rehabilitate your injury. These exercises should be done near your "sweet spot." This is the neutral, low-back arch, somewhere between fully rounded   and fully arched, that is your least painful position. When performed in this safe range of motion, these exercises can be used for people who have either a flexion or extension based injury. These exercises may resolve your symptoms with or without further involvement from your physician, physical therapist or athletic trainer. While completing these exercises, remember:   Muscles can gain both the endurance and the strength needed for everyday activities through controlled exercises.  Complete these exercises as instructed by your physician, physical therapist or athletic trainer. Increase the resistance and repetitions only as guided.  You may experience muscle soreness or fatigue, but the pain or discomfort you are trying to eliminate should never worsen during these exercises. If this pain does worsen, stop and make certain you are following the directions exactly. If the pain is still present after adjustments, discontinue the  exercise until you can discuss the trouble with your caregiver. STRENGTHENING - Deep Abdominals, Pelvic Tilt   Lie on a firm bed or floor. Keeping your legs in front of you, bend your knees so they are both pointed toward the ceiling and your feet are flat on the floor.  Tense your lower abdominal muscles to press your low back into the floor. This motion will rotate your pelvis so that your tail bone is scooping upwards rather than pointing at your feet or into the floor. With a gentle tension and even breathing, hold this position for __________ seconds. Repeat __________ times. Complete this exercise __________ times per day.  STRENGTHENING - Abdominals, Crunches   Lie on a firm bed or floor. Keeping your legs in front of you, bend your knees so they are both pointed toward the ceiling and your feet are flat on the floor. Cross your arms over your chest.  Slightly tip your chin down without bending your neck.  Tense your abdominals and slowly lift your trunk high enough to just clear your shoulder blades. Lifting higher can put excessive stress on the lower back and does not further strengthen your abdominal muscles.  Control your return to the starting position. Repeat __________ times. Complete this exercise __________ times per day.  STRENGTHENING - Quadruped, Opposite UE/LE Lift   Assume a hands and knees position on a firm surface. Keep your hands under your shoulders and your knees under your hips. You may place padding under your knees for comfort.  Find your neutral spine and gently tense your abdominal muscles so that you can maintain this position. Your shoulders and hips should form a rectangle that is parallel with the floor and is not twisted.  Keeping your trunk steady, lift your right hand no higher than your shoulder and then your left leg no higher than your hip. Make sure you are not holding your breath. Hold this position for __________ seconds.  Continuing to keep  your abdominal muscles tense and your back steady, slowly return to your starting position. Repeat with the opposite arm and leg. Repeat __________ times. Complete this exercise __________ times per day.  STRENGTHENING - Abdominals and Quadriceps, Straight Leg Raise   Lie on a firm bed or floor with both legs extended in front of you.  Keeping one leg in contact with the floor, bend the other knee so that your foot can rest flat on the floor.  Find your neutral spine, and tense your abdominal muscles to maintain your spinal position throughout the exercise.  Slowly lift your straight leg off the floor about 6 inches for a count   of 15, making sure to not hold your breath.  Still keeping your neutral spine, slowly lower your leg all the way to the floor. Repeat this exercise with each leg __________ times. Complete this exercise __________ times per day. POSTURE AND BODY MECHANICS CONSIDERATIONS - Low Back Sprain Keeping correct posture when sitting, standing or completing your activities will reduce the stress put on different body tissues, allowing injured tissues a chance to heal and limiting painful experiences. The following are general guidelines for improved posture. Your physician or physical therapist will provide you with any instructions specific to your needs. While reading these guidelines, remember:  The exercises prescribed by your provider will help you have the flexibility and strength to maintain correct postures.  The correct posture provides the best environment for your joints to work. All of your joints have less wear and tear when properly supported by a spine with good posture. This means you will experience a healthier, less painful body.  Correct posture must be practiced with all of your activities, especially prolonged sitting and standing. Correct posture is as important when doing repetitive low-stress activities (typing) as it is when doing a single heavy-load  activity (lifting). RESTING POSITIONS Consider which positions are most painful for you when choosing a resting position. If you have pain with flexion-based activities (sitting, bending, stooping, squatting), choose a position that allows you to rest in a less flexed posture. You would want to avoid curling into a fetal position on your side. If your pain worsens with extension-based activities (prolonged standing, working overhead), avoid resting in an extended position such as sleeping on your stomach. Most people will find more comfort when they rest with their spine in a more neutral position, neither too rounded nor too arched. Lying on a non-sagging bed on your side with a pillow between your knees, or on your back with a pillow under your knees will often provide some relief. Keep in mind, being in any one position for a prolonged period of time, no matter how correct your posture, can still lead to stiffness. PROPER SITTING POSTURE In order to minimize stress and discomfort on your spine, you must sit with correct posture. Sitting with good posture should be effortless for a healthy body. Returning to good posture is a gradual process. Many people can work toward this most comfortably by using various supports until they have the flexibility and strength to maintain this posture on their own. When sitting with proper posture, your ears will fall over your shoulders and your shoulders will fall over your hips. You should use the back of the chair to support your upper back. Your lower back will be in a neutral position, just slightly arched. You may place a small pillow or folded towel at the base of your lower back for  support.  When working at a desk, create an environment that supports good, upright posture. Without extra support, muscles tire, which leads to excessive strain on joints and other tissues. Keep these recommendations in mind: CHAIR:  A chair should be able to slide under your desk  when your back makes contact with the back of the chair. This allows you to work closely.  The chair's height should allow your eyes to be level with the upper part of your monitor and your hands to be slightly lower than your elbows. BODY POSITION  Your feet should make contact with the floor. If this is not possible, use a foot rest.  Keep your   ears over your shoulders. This will reduce stress on your neck and low back. INCORRECT SITTING POSTURES  If you are feeling tired and unable to assume a healthy sitting posture, do not slouch or slump. This puts excessive strain on your back tissues, causing more damage and pain. Healthier options include:  Using more support, like a lumbar pillow.  Switching tasks to something that requires you to be upright or walking.  Talking a brief walk.  Lying down to rest in a neutral-spine position. PROLONGED STANDING WHILE SLIGHTLY LEANING FORWARD  When completing a task that requires you to lean forward while standing in one place for a long time, place either foot up on a stationary 2-4 inch high object to help maintain the best posture. When both feet are on the ground, the lower back tends to lose its slight inward curve. If this curve flattens (or becomes too large), then the back and your other joints will experience too much stress, tire more quickly, and can cause pain. CORRECT STANDING POSTURES Proper standing posture should be assumed with all daily activities, even if they only take a few moments, like when brushing your teeth. As in sitting, your ears should fall over your shoulders and your shoulders should fall over your hips. You should keep a slight tension in your abdominal muscles to brace your spine. Your tailbone should point down to the ground, not behind your body, resulting in an over-extended swayback posture.  INCORRECT STANDING POSTURES  Common incorrect standing postures include a forward head, locked knees and/or an excessive  swayback. WALKING Walk with an upright posture. Your ears, shoulders and hips should all line-up. PROLONGED ACTIVITY IN A FLEXED POSITION When completing a task that requires you to bend forward at your waist or lean over a low surface, try to find a way to stabilize 3 out of 4 of your limbs. You can place a hand or elbow on your thigh or rest a knee on the surface you are reaching across. This will provide you more stability, so that your muscles do not tire as quickly. By keeping your knees relaxed, or slightly bent, you will also reduce stress across your lower back. CORRECT LIFTING TECHNIQUES DO :  Assume a wide stance. This will provide you more stability and the opportunity to get as close as possible to the object which you are lifting.  Tense your abdominals to brace your spine. Bend at the knees and hips. Keeping your back locked in a neutral-spine position, lift using your leg muscles. Lift with your legs, keeping your back straight.  Test the weight of unknown objects before attempting to lift them.  Try to keep your elbows locked down at your sides in order get the best strength from your shoulders when carrying an object.  Always ask for help when lifting heavy or awkward objects. INCORRECT LIFTING TECHNIQUES DO NOT:   Lock your knees when lifting, even if it is a small object.  Bend and twist. Pivot at your feet or move your feet when needing to change directions.  Assume that you can safely pick up even a paperclip without proper posture. Document Released: 05/14/2005 Document Revised: 08/06/2011 Document Reviewed: 08/26/2008 [employer] Patient Information 2015 [employer]. This information is not intended to replace advice given to you by your health care provider. Make sure you discuss any questions you have with your health care provider.  

## 2015-02-21 ENCOUNTER — Telehealth: Payer: Self-pay | Admitting: Family Medicine

## 2015-02-21 NOTE — Telephone Encounter (Signed)
Pt last visit was 02/11/2015 pt doesn't have an upcoming appointment. Please advise

## 2015-02-21 NOTE — Telephone Encounter (Signed)
Refill OK

## 2015-02-21 NOTE — Telephone Encounter (Signed)
Sent Rx to the Pharmacy

## 2015-03-22 ENCOUNTER — Encounter: Payer: Self-pay | Admitting: Family Medicine

## 2015-04-04 ENCOUNTER — Other Ambulatory Visit: Payer: Self-pay | Admitting: Family Medicine

## 2015-06-18 ENCOUNTER — Other Ambulatory Visit: Payer: Self-pay | Admitting: Family Medicine

## 2015-08-12 ENCOUNTER — Ambulatory Visit (INDEPENDENT_AMBULATORY_CARE_PROVIDER_SITE_OTHER): Payer: BLUE CROSS/BLUE SHIELD | Admitting: Family Medicine

## 2015-08-12 VITALS — BP 112/60 | HR 89 | Temp 97.6°F | Ht 65.0 in | Wt 152.0 lb

## 2015-08-12 DIAGNOSIS — Z23 Encounter for immunization: Secondary | ICD-10-CM | POA: Diagnosis not present

## 2015-08-12 DIAGNOSIS — M546 Pain in thoracic spine: Secondary | ICD-10-CM

## 2015-08-12 NOTE — Patient Instructions (Signed)
Continue with ice and/or heat for back pain Continue with night use of muscle relaxer.

## 2015-08-12 NOTE — Progress Notes (Signed)
   Subjective:    Patient ID: Vicki [name], female    DOB: 11-Sep-1960, 55 y.o.   MRN: [id number]  HPI Patient here for the following issues  Needs shingles vaccine. No contraindications. No prior history of shingles.  Right thoracic back pain. Location is mid thoracic area. Onset last week when she was lifting some luggage when flying. She has soreness worse with movement. Tried muscle massage without much relief. Some relief with nonsteroidals and ice. No associated skin rash. No cough. No dyspnea. Pain is moderate  Past Medical History  Diagnosis Date  . Asthma   . Allergy   . Thyroid disease   . Celiac disease   . Anxiety     hx ?panic disorder   Past Surgical History  Procedure Laterality Date  . Cesarean section      reports that she has never smoked. She has never used smokeless tobacco. She reports that she does not drink alcohol or use illicit drugs. family history includes Alcohol abuse in her father and mother; Arthritis in her maternal grandmother; Asthma in her paternal aunt; Cancer in her mother; Cancer (age of onset: 69) in her sister; Stroke in her father. There is no history of Heart disease. Allergies  Allergen Reactions  . Ceclor [Cefaclor]     hives  . Lamisil [Terbinafine Hcl] Hives      Review of Systems  Constitutional: Negative for fever and chills.  Respiratory: Negative for shortness of breath.   Cardiovascular: Negative for chest pain.  Gastrointestinal: Negative for abdominal pain.  Musculoskeletal: Positive for back pain.  Skin: Negative for rash.       Objective:   Physical Exam  Constitutional: She appears well-developed and well-nourished.  Cardiovascular: Normal rate and regular rhythm.   Pulmonary/Chest: Effort normal and breath sounds normal. No respiratory distress. She has no wheezes. She has no rales.  Musculoskeletal:  No spinal tenderness. She has some mild right paraspinous muscle tenderness mid thoracic area.           Assessment & Plan:  #1 request for shingles vaccination. No contraindications. This will be given  #2 right mid thoracic back pain. Suspect muscular. Continue ice. She has muscle relaxer at home which she'll take as needed. Touch base in one week if not improving further

## 2015-08-12 NOTE — Progress Notes (Signed)
Pre visit review using our clinic review tool, if applicable. No additional management support is needed unless otherwise documented below in the visit note. 

## 2015-08-12 NOTE — Addendum Note (Signed)
Addended by: Elio Forget on: 08/12/2015 11:57 AM   Modules accepted: Orders

## 2015-08-14 ENCOUNTER — Other Ambulatory Visit: Payer: Self-pay | Admitting: Family Medicine

## 2015-08-15 ENCOUNTER — Telehealth: Payer: Self-pay | Admitting: Family Medicine

## 2015-08-15 ENCOUNTER — Ambulatory Visit (INDEPENDENT_AMBULATORY_CARE_PROVIDER_SITE_OTHER): Payer: BLUE CROSS/BLUE SHIELD | Admitting: Family Medicine

## 2015-08-15 VITALS — BP 108/70 | HR 76 | Temp 98.6°F | Ht 65.0 in | Wt 154.0 lb

## 2015-08-15 DIAGNOSIS — L03113 Cellulitis of right upper limb: Secondary | ICD-10-CM

## 2015-08-15 MED ORDER — DOXYCYCLINE HYCLATE 100 MG PO CAPS: 100.0000 mg | ORAL_CAPSULE | Freq: Two times a day (BID) | ORAL | Status: DC

## 2015-08-15 NOTE — Progress Notes (Signed)
Pre visit review using our clinic review tool, if applicable. No additional management support is needed unless otherwise documented below in the visit note. 

## 2015-08-15 NOTE — Telephone Encounter (Signed)
Needs to be seen

## 2015-08-15 NOTE — Progress Notes (Signed)
   Subjective:    Patient ID: Vicki [name], female    DOB: 1960-09-10, 55 y.o.   MRN: [id number]  HPI  patient seen with possible cellulitis right arm.  She was here last week for shingles vaccine.  She noticed some mild erythema couple days ago and this has progressed throughout the day.  She does have some mild itching but also some mild pain. No fevers or chills.  No prior history of MRSA.  Allergy to  Ceclor.  Past Medical History  Diagnosis Date  . Asthma   . Allergy   . Thyroid disease   . Celiac disease   . Anxiety     hx ?panic disorder   Past Surgical History  Procedure Laterality Date  . Cesarean section      reports that she has never smoked. She has never used smokeless tobacco. She reports that she does not drink alcohol or use illicit drugs. family history includes Alcohol abuse in her father and mother; Arthritis in her maternal grandmother; Asthma in her paternal aunt; Cancer in her mother; Cancer (age of onset: 33) in her sister; Stroke in her father. There is no history of Heart disease. Allergies  Allergen Reactions  . Ceclor [Cefaclor]     hives  . Lamisil [Terbinafine Hcl] Hives  . Zoster Vaccine Live Rash      Review of Systems  Constitutional: Negative for fever and chills.  Gastrointestinal: Negative for nausea and vomiting.  Neurological: Negative for weakness.       Objective:   Physical Exam  Constitutional: She appears well-developed and well-nourished. No distress.  Cardiovascular: Normal rate and regular rhythm.   Pulmonary/Chest: Effort normal and breath sounds normal. No respiratory distress. She has no wheezes. She has no rales.  Skin:  Right posterior arm 12 x 13 cm area of erythema with slightly darkened reddish center. No pustules. Minimally warm to touch. Minimal tenderness.          Assessment & Plan:   Probable early cellulitis right arm following recent injection. Recommend topical heat several times daily. She has  allergies to Ceclor as above. Doxycycline 100 mg twice a day for 10 days. Follow-up probably for any fever, progressive redness, or other concerns.   We explained that we cannot fully differentiate between possible reaction to the vaccine versus cellulitis but given the fact that she has some soreness cover with antibiotic

## 2015-08-15 NOTE — Telephone Encounter (Signed)
DUPLCATE CHART  ADDED APPT TIME   [facility] - Client TELEPHONE [place] [facility]  Patient Name: Vicki [name]  DOB: November 12, 1960    Initial Comment caller states she recv'd her shingles vaccine on Friday - the injection site is swollen to the size of a tennis ball - it is very red - it looks like it has gone into cellulitis - armpit and arm are swollen also   Nurse Assessment  Nurse: Wynetta Emery, RN, Baker Janus Date/Time (Eastern Time): 08/15/2015 9:08:56 AM  Confirm and document reason for call. If symptomatic, describe symptoms. You must click the next button to save text entered. ---Wealthy had shingle vaccination on Friday developed redness warmth swelling to site arm, and armpit now sore and swollen  Has the patient traveled out of the country within the last 30 days? ---No  Does the patient have any new or worsening symptoms? ---Yes  Will a triage be completed? ---Yes  Related visit to physician within the last 2 weeks? ---Yes  Does the PT have any chronic conditions? (i.e. diabetes, asthma, etc.) ---Unknown  Is this a behavioral health or substance abuse call? ---No     Guidelines    Guideline Title Affirmed Question Affirmed Notes  Immunization Reactions [1] Redness or red streak around the injection site AND [2] begins > 48 hours after shot AND [3] no fever (Exception: red area < 1 inch or 2.5 cm wide)    Final Disposition User   See Physician within 24 Hours Asher, RN, Baker Janus    Comments  NOTE: Duplicate Note did not enter appt time and day doctor 08-15-2015 415pm arrival time 430pm appt time Dr. Elease Hashimoto; r/o cellulitis of arm from shingle vaccination   Referrals  REFERRED TO PCP OFFICE   Disagree/Comply: Comply

## 2015-08-15 NOTE — Telephone Encounter (Signed)
[facility] - Client [facility]  Patient Name: Vicki [name]  DOB: 1960/11/28    Initial Comment caller states she recv'd her shingles vaccine on Friday - the injection site is swollen to the size of a tennis ball - it is very red - it looks like it has gone into cellulitis - armpit and arm are swollen also   Nurse Assessment  Nurse: Wynetta Emery, RN, Baker Janus Date/Time (Eastern Time): 08/15/2015 9:08:56 AM  Confirm and document reason for call. If symptomatic, describe symptoms. You must click the next button to save text entered. ---[name] had shingle vaccination on Friday developed redness warmth swelling to site arm, and armpit now sore and swollen  Has the patient traveled out of the country within the last 30 days? ---No  Does the patient have any new or worsening symptoms? ---Yes  Will a triage be completed? ---Yes  Related visit to physician within the last 2 weeks? ---Yes  Does the PT have any chronic conditions? (i.e. diabetes, asthma, etc.) ---Unknown  Is this a behavioral health or substance abuse call? ---No     Guidelines    Guideline Title Affirmed Question Affirmed Notes  Immunization Reactions [1] Redness or red streak around the injection site AND [2] begins > 48 hours after shot AND [3] no fever (Exception: red area < 1 inch or 2.5 cm wide)    Final Disposition User   See Physician within [place], RN, [facility]    Referrals  REFERRED TO PCP OFFICE   Disagree/Comply: Leta Baptist

## 2015-08-15 NOTE — Telephone Encounter (Signed)
Pending at 430.

## 2015-08-15 NOTE — Patient Instructions (Signed)

## 2015-09-05 ENCOUNTER — Ambulatory Visit (INDEPENDENT_AMBULATORY_CARE_PROVIDER_SITE_OTHER): Payer: BLUE CROSS/BLUE SHIELD | Admitting: Family Medicine

## 2015-09-05 VITALS — BP 110/80 | HR 79 | Temp 98.3°F | Ht 65.0 in | Wt 154.0 lb

## 2015-09-05 DIAGNOSIS — J0100 Acute maxillary sinusitis, unspecified: Secondary | ICD-10-CM | POA: Diagnosis not present

## 2015-09-05 MED ORDER — PREDNISONE 10 MG PO TABS: ORAL_TABLET | ORAL | Status: DC

## 2015-09-05 MED ORDER — AMOXICILLIN-POT CLAVULANATE 875-125 MG PO TABS: 1.0000 | ORAL_TABLET | Freq: Two times a day (BID) | ORAL | Status: DC

## 2015-09-05 NOTE — Progress Notes (Signed)
   Subjective:    Patient ID: Vicki [name], female    DOB: 05-09-1961, 55 y.o.   MRN: [id number]  HPI Acute Visit   Patient seen today with a complaint of nasal congestion, sinus pressure, cough, and sore throat times ten days.  Patient has been caring for her acutely ill son in [place].  Upon returning home she began to experience upper respiratory symptoms including a non-productive cough. She did not initially experience fever until last night she reports a low grade temperature of 99.8 F.  She denies nausea, vomiting, or wheezing. She reports some fatigue and body aches. She has taken Mucinex DM which improved cough only. Patient reports feeling that her symptoms are worsening compared to when they initially developed.   Past Medical History  Diagnosis Date  . Asthma   . Allergy   . Thyroid disease   . Celiac disease   . Anxiety     hx ?panic disorder   Past Surgical History  Procedure Laterality Date  . Cesarean section      reports that she has never smoked. She has never used smokeless tobacco. She reports that she does not drink alcohol or use illicit drugs. family history includes Alcohol abuse in her father and mother; Arthritis in her maternal grandmother; Asthma in her paternal aunt; Cancer in her mother; Cancer (age of onset: 29) in her sister; Stroke in her father. There is no history of Heart disease. Allergies  Allergen Reactions  . Ceclor [Cefaclor]     hives  . Lamisil [Terbinafine Hcl] Hives  . Zoster Vaccine Live Rash  '     Review of Systems  Constitutional: Positive for fever and fatigue.  HENT: Positive for congestion, sinus pressure, sneezing and sore throat.   Respiratory: Positive for cough. Negative for shortness of breath and wheezing.   Cardiovascular: Negative.   Gastrointestinal: Negative for nausea.       Objective:   Physical Exam  Constitutional: She appears well-developed and well-nourished.  HENT:  Head: Normocephalic and  atraumatic.  Right Ear: External ear normal.  Left Ear: External ear normal.  Eyes: Conjunctivae and EOM are normal. Pupils are equal, round, and reactive to light.  Neck: Normal range of motion. Neck supple.  Cardiovascular: Normal rate, regular rhythm, normal heart sounds and intact distal pulses.   Pulmonary/Chest: Effort normal and breath sounds normal.  Lymphadenopathy:    She has no cervical adenopathy.  Skin: Skin is warm and dry.          Assessment & Plan:  Acute bilateral maxillary sinusitis- Bilateral maxillary facial pain times 10 days with accompanying nasal congestion likely sinusitis.  Plan: Augmentin 875-125 mg twice daily for 10 days Prednisone taper 10 mg times 8 days.

## 2015-09-05 NOTE — Progress Notes (Signed)
Pre visit review using our clinic review tool, if applicable. No additional management support is needed unless otherwise documented below in the visit note. 

## 2015-09-20 ENCOUNTER — Other Ambulatory Visit: Payer: Self-pay | Admitting: Family Medicine

## 2015-10-25 ENCOUNTER — Other Ambulatory Visit: Payer: Self-pay | Admitting: Family Medicine

## 2015-11-04 ENCOUNTER — Telehealth: Payer: Self-pay | Admitting: Internal Medicine

## 2015-11-04 ENCOUNTER — Encounter: Payer: Self-pay | Admitting: Internal Medicine

## 2015-11-04 NOTE — Telephone Encounter (Signed)
Pt scheduled to see Alonza Bogus PA 11/09/15@10 :30am, pt aware of appt.

## 2015-11-07 ENCOUNTER — Ambulatory Visit: Payer: BLUE CROSS/BLUE SHIELD | Admitting: Family Medicine

## 2015-11-08 ENCOUNTER — Ambulatory Visit: Payer: BLUE CROSS/BLUE SHIELD | Admitting: Family Medicine

## 2015-11-09 ENCOUNTER — Other Ambulatory Visit (INDEPENDENT_AMBULATORY_CARE_PROVIDER_SITE_OTHER): Payer: BLUE CROSS/BLUE SHIELD

## 2015-11-09 ENCOUNTER — Ambulatory Visit (INDEPENDENT_AMBULATORY_CARE_PROVIDER_SITE_OTHER): Payer: BLUE CROSS/BLUE SHIELD | Admitting: Gastroenterology

## 2015-11-09 ENCOUNTER — Encounter: Payer: Self-pay | Admitting: Gastroenterology

## 2015-11-09 VITALS — BP 118/70 | HR 78 | Ht 65.0 in | Wt 148.0 lb

## 2015-11-09 DIAGNOSIS — R195 Other fecal abnormalities: Secondary | ICD-10-CM

## 2015-11-09 DIAGNOSIS — R142 Eructation: Secondary | ICD-10-CM

## 2015-11-09 DIAGNOSIS — K219 Gastro-esophageal reflux disease without esophagitis: Secondary | ICD-10-CM | POA: Diagnosis not present

## 2015-11-09 DIAGNOSIS — K900 Celiac disease: Secondary | ICD-10-CM

## 2015-11-09 LAB — IGA: IgA: 105 mg/dL (ref 68–378)

## 2015-11-09 LAB — CBC WITH DIFFERENTIAL/PLATELET
BASOS ABS: 0 10*3/uL (ref 0.0–0.1)
BASOS PCT: 0.7 % (ref 0.0–3.0)
EOS ABS: 0.5 10*3/uL (ref 0.0–0.7)
Eosinophils Relative: 7.3 % — ABNORMAL HIGH (ref 0.0–5.0)
HCT: 40.2 % (ref 36.0–46.0)
HEMOGLOBIN: 13.2 g/dL (ref 12.0–15.0)
LYMPHS PCT: 27.1 % (ref 12.0–46.0)
Lymphs Abs: 1.7 10*3/uL (ref 0.7–4.0)
MCHC: 33 g/dL (ref 30.0–36.0)
MCV: 92.6 fl (ref 78.0–100.0)
MONO ABS: 0.5 10*3/uL (ref 0.1–1.0)
Monocytes Relative: 7.4 % (ref 3.0–12.0)
NEUTROS ABS: 3.7 10*3/uL (ref 1.4–7.7)
Neutrophils Relative %: 57.5 % (ref 43.0–77.0)
Platelets: 249 10*3/uL (ref 150.0–400.0)
RBC: 4.33 Mil/uL (ref 3.87–5.11)
RDW: 13.2 % (ref 11.5–15.5)
WBC: 6.4 10*3/uL (ref 4.0–10.5)

## 2015-11-09 LAB — COMPREHENSIVE METABOLIC PANEL
ALBUMIN: 4.7 g/dL (ref 3.5–5.2)
ALK PHOS: 44 U/L (ref 39–117)
ALT: 14 U/L (ref 0–35)
AST: 19 U/L (ref 0–37)
BUN: 9 mg/dL (ref 6–23)
CO2: 30 mEq/L (ref 19–32)
CREATININE: 0.84 mg/dL (ref 0.40–1.20)
Calcium: 10.1 mg/dL (ref 8.4–10.5)
Chloride: 103 mEq/L (ref 96–112)
GFR: 74.46 mL/min (ref >60.00)
Glucose, Bld: 93 mg/dL (ref 70–99)
Potassium: 4.2 mEq/L (ref 3.5–5.1)
SODIUM: 139 meq/L (ref 135–145)
TOTAL PROTEIN: 7.5 g/dL (ref 6.0–8.3)
Total Bilirubin: 0.5 mg/dL (ref 0.2–1.2)

## 2015-11-09 LAB — TSH: TSH: 2.66 u[IU]/mL (ref 0.35–4.50)

## 2015-11-09 MED ORDER — SACCHAROMYCES BOULARDII 250 MG PO CAPS
250.0000 mg | ORAL_CAPSULE | Freq: Two times a day (BID) | ORAL | Status: DC
Start: 2015-11-09 — End: 2016-04-03

## 2015-11-09 MED ORDER — PANTOPRAZOLE SODIUM 40 MG PO TBEC: 40.0000 mg | DELAYED_RELEASE_TABLET | Freq: Every day | ORAL | Status: DC

## 2015-11-09 NOTE — Patient Instructions (Addendum)
Please go to the basement level to have your labs drawn.  We sent prescriptions to [facility] [place] [address]. 1. Pantoprazole sodium 40 mg 2. Florastor - Take 1 tablet twice daily.  We have given you a low fiber diet brochure.  You have been scheduled for an endoscopy. Please follow written instructions given to you at your visit today. If you use inhalers (even only as needed), please bring them with you on the day of your procedure. Your physician has requested that you go to www.startemmi.com and enter the access code given to you at your visit today. This web site gives a general overview about your procedure. However, you should still follow specific instructions given to you by our office regarding your preparation for the procedure.

## 2015-11-09 NOTE — Progress Notes (Addendum)
11/09/2015 Vicki [name] [id number] 10-09-1960   HISTORY OF PRESENT ILLNESS:  This is a 55 year old female who is new to our practice and has GI history with Dr. Algis Greenhouse in [place] 5 or 6 years ago at which time she was diagnosed with celiac disease, however, we do have have those records.  She apparently had colonoscopy and endoscopy at that time. Her husband is a Immunologist and works for [employer] in our endoscopy center periodically. She is requesting Dr. Hilarie Fredrickson for her new physician.  She is here today with several GI complaints. She says that since being diagnosed in 2011 with celiac disease she has been very compliant with her diet and had not had any GI symptoms until recently. For the past several weeks she's been having a lot of reflux, belching, and nausea along with some epigastric burning that goes up into her chest. She says that initially she took Nexium OTC for 14 days and that seemed to help. Then she was put on a course of prednisone for some poison ivy and that seemed to kick her symptoms back up again. She took another course of the Nexium without improvement and then tried OTC Prilosec as well with no improvement. She has also been taking Gaviscon with no relief. She also reports that for the past two weeks she has had some soft to loose stools with some mild lower abdominal cramping. She had one episode of some mucus from her rectum with a small amount of blood. That occurred last week and she has not had any further bleeding since that time. She has tried to eliminate lactose from her diet and has also tried a [employer] without any improvement in her symptoms.  No new/recent labs.  No recent travel. She was on a course of Augmentin for sinusitis back in April around the time that her upper GI symptoms began.   Past Medical History  Diagnosis Date  . Asthma   . Allergy   . Thyroid disease   . Celiac disease   . Anxiety     hx ?panic disorder  . Skin cancer     chin area    . Migraines     chronic  . GERD (gastroesophageal reflux disease)    Past Surgical History  Procedure Laterality Date  . Cesarean section  1984  . Tonsillectomy and adenoidectomy  1964  . Tubal ligation  1998  . Dilation and curettage of uterus  2012  . Ablation  2012    reports that she has never smoked. She has never used smokeless tobacco. She reports that she does not drink alcohol or use illicit drugs. family history includes Alcohol abuse in her father and mother; Arthritis in her maternal grandmother; Asthma in her paternal aunt; Cancer in her mother; Cancer (age of onset: 42) in her sister; Colon cancer in her paternal grandfather; Irritable bowel syndrome in her sister; Lupus in her sister; Stroke in her father. There is no history of Heart disease. Allergies  Allergen Reactions  . Ceclor [Cefaclor]     hives  . Lamisil [Terbinafine Hcl] Hives  . Zoster Vaccine Live Rash      Outpatient Encounter Prescriptions as of 11/09/2015  Medication Sig  . baclofen (LIORESAL) 10 MG tablet Take 10 mg by mouth every 8 (eight) hours as needed (for migraines).  Marland Kitchen estradiol (ESTRACE) 0.1 MG/GM vaginal cream Place 2 g vaginally daily.  . Multiple Vitamins-Minerals (WOMENS MULTIVITAMIN PLUS PO) Take by mouth  daily.  Marland Kitchen NASONEX 50 MCG/ACT nasal spray USE 2 SPRAYS NASALLY EVERY DAY  . PARoxetine (PAXIL) 10 MG tablet Take 5 mg by mouth at bedtime.  . SUMAtriptan (IMITREX) 100 MG tablet TAKE 1 TABLET BY MOUTH EVERY 2 HOURS AS NEEDED FOR MIGRAINE  . pantoprazole (PROTONIX) 40 MG tablet Take 1 tablet (40 mg total) by mouth daily.  Marland Kitchen saccharomyces boulardii (FLORASTOR) 250 MG capsule Take 1 capsule (250 mg total) by mouth 2 (two) times daily.  . [DISCONTINUED] amoxicillin-clavulanate (AUGMENTIN) 875-125 MG tablet Take 1 tablet by mouth 2 (two) times daily.  . [DISCONTINUED] PAXIL 10 MG tablet TAKE 1 TABLET BY MOUTH EVERY MORNING  . [DISCONTINUED] PAXIL 10 MG tablet TAKE 1 TABLET BY MOUTH EVERY  MORNING  . [DISCONTINUED] predniSONE (DELTASONE) 10 MG tablet Taper as follows: 4-4-3-3-2-2-1-1  . [DISCONTINUED] ULTRAM 50 MG tablet TAKE 1 TABLET BY MOUTH EVERY 6 HOURS AS NEEDED   No facility-administered encounter medications on file as of 11/09/2015.     REVIEW OF SYSTEMS  : All other systems reviewed and negative except where noted in the History of Present Illness.   PHYSICAL EXAM: BP 118/70 mmHg  Pulse 78  Ht 5' 5"  (1.651 m)  Wt 148 lb (67.132 kg)  BMI 24.63 kg/m2 General: Well developed white female in no acute distress Head: Normocephalic and atraumatic Eyes:  Sclerae anicteric, conjunctiva pink. Ears: Normal auditory acuity Lungs: Clear throughout to auscultation Heart: Regular rate and rhythm Abdomen: Soft, non-distended.  Normal bowel sounds.  Non-tender. Musculoskeletal: Symmetrical with no gross deformities  Skin: No lesions on visible extremities Extremities: No edema  Neurological: Alert oriented x 4, grossly non-focal Psychological:  Alert and cooperative. Normal mood and affect  ASSESSMENT AND PLAN: -55 year old female with celiac disease, compliant with her diet.  Has been experiencing issues with reflux, nausea, and belching despite trials of some OTC PPI's.  Will schedule EGD with Dr. Hilarie Fredrickson per patient's request.  The risks, benefits, and alternatives to EGD were discussed with the patient and she consents to proceed.  I will prescribe pantoprazole 40 mg daily.  Discussed carafate as well but will see how she does with the PPI alone first. -Loose/soft stools:  Only present for 2 weeks.  She was on Augmentin back in April, which may have disrupted her normal gut flora.  Does not sound extreme at this point.  Will start with adding probiotic, Florastor, twice daily.We'll have her follow low fiber/low residue diet. Since we do not have any recent labs we will check a CBC, CMP, TSH as well as her celiac studies.  *We are awaiting her records from Dr. Algis Greenhouse, GI  in [place].  CC:  Eulas Post, MD   Addendum: Reviewed and agree with initial management. Jerene Bears, MD

## 2015-11-10 LAB — TISSUE TRANSGLUTAMINASE, IGG: TISSUE TRANSGLUT AB: 1 U/mL (ref <6)

## 2015-11-14 ENCOUNTER — Encounter: Payer: Self-pay | Admitting: Internal Medicine

## 2015-11-22 ENCOUNTER — Ambulatory Visit (AMBULATORY_SURGERY_CENTER): Payer: BLUE CROSS/BLUE SHIELD | Admitting: Internal Medicine

## 2015-11-22 ENCOUNTER — Encounter: Payer: Self-pay | Admitting: Internal Medicine

## 2015-11-22 ENCOUNTER — Telehealth: Payer: Self-pay | Admitting: Internal Medicine

## 2015-11-22 VITALS — BP 133/69 | HR 89 | Temp 95.9°F | Resp 16 | Ht 65.0 in | Wt 148.0 lb

## 2015-11-22 DIAGNOSIS — K219 Gastro-esophageal reflux disease without esophagitis: Secondary | ICD-10-CM | POA: Diagnosis present

## 2015-11-22 DIAGNOSIS — K2950 Unspecified chronic gastritis without bleeding: Secondary | ICD-10-CM | POA: Diagnosis not present

## 2015-11-22 DIAGNOSIS — K2980 Duodenitis without bleeding: Secondary | ICD-10-CM | POA: Diagnosis not present

## 2015-11-22 MED ORDER — SODIUM CHLORIDE 0.9 % IV SOLN: 500.0000 mL | INTRAVENOUS | Status: DC

## 2015-11-22 MED ORDER — PANTOPRAZOLE SODIUM 40 MG PO TBEC: 40.0000 mg | DELAYED_RELEASE_TABLET | Freq: Two times a day (BID) | ORAL | Status: DC

## 2015-11-22 MED ORDER — SUCRALFATE 1 GM/10ML PO SUSP: 1.0000 g | Freq: Three times a day (TID) | ORAL | Status: DC

## 2015-11-22 NOTE — Op Note (Signed)
[facility] Patient Name: Vicki [name] Procedure Date: 11/22/2015 9:36 AM MRN: [id number] Endoscopist: Jerene Bears , MD Age: 55 Referring MD:  Date of Birth: [birth year] Gender: Female Account #: [id number] Procedure:                Upper GI endoscopy Indications:              Epigastric abdominal pain, Heartburn, Celiac                            disease, Eructation, Nausea Medicines:                Monitored Anesthesia Care Procedure:                Pre-Anesthesia Assessment:                           - Prior to the procedure, a History and Physical                            was performed, and patient medications and                            allergies were reviewed. The patient's tolerance of                            previous anesthesia was also reviewed. The risks                            and benefits of the procedure and the sedation                            options and risks were discussed with the patient.                            All questions were answered, and informed consent                            was obtained. Prior Anticoagulants: The patient has                            taken no previous anticoagulant or antiplatelet                            agents. ASA Grade Assessment: II - A patient with                            mild systemic disease. After reviewing the risks                            and benefits, the patient was deemed in                            satisfactory condition to undergo the procedure.  After obtaining informed consent, the endoscope was                            passed under direct vision. Throughout the                            procedure, the patient's blood pressure, pulse, and                            oxygen saturations were monitored continuously. The                            Model GIF-HQ190 [id number]) scope was introduced                            through the mouth, and advanced  to the second part                            of duodenum. The upper GI endoscopy was                            accomplished without difficulty. The patient                            tolerated the procedure well. Scope In: Scope Out: Findings:                 In the proximal esophagus there was a small inlet                            patch without nodularity or visible abnormality.                           Normal mucosa was found in the entire esophagus.                           A widely patent Schatzki ring (acquired) was found                            at the gastroesophageal junction.                           The entire examined stomach was normal. Biopsies                            were taken with a cold forceps for histology and                            Helicobacter pylori testing.                           The cardia and gastric fundus were normal on                            retroflexion.  The examined duodenum was normal. Eight biopsies                            were obtained with cold forceps for histology and                            evaluation of celiac disease randomly in the                            duodenal bulb and in the second portion of the                            duodenum. Complications:            No immediate complications. Estimated Blood Loss:     Estimated blood loss was minimal. Impression:               - Normal mucosa was found in the entire esophagus.                           - Widely patent Schatzki ring.                           - Normal stomach. Biopsied.                           - Normal examined duodenum.                           - Eight biopsies were obtained in the duodenal bulb                            and in the second portion of the duodenum. Recommendation:           - Patient has a contact number available for                            emergencies. The signs and symptoms of potential                             delayed complications were discussed with the                            patient. Return to normal activities tomorrow.                            Written discharge instructions were provided to the                            patient.                           - Gluten free diet.                           - Continue present medications.                           -  Await pathology results.                           - Use sucralfate suspension 1 gram PO before meals                            and at bedtime.                           - Increase pantoprazole to 40 mg twice daily before                            meals (given ongoing symptoms).                           - Perform a colonoscopy at appointment to be                            scheduled. Jerene Bears, MD 11/22/2015 10:11:19 AM This report has been signed electronically.

## 2015-11-22 NOTE — Telephone Encounter (Signed)
Colonoscopy scheduled.

## 2015-11-22 NOTE — Patient Instructions (Signed)
YOU HAD AN ENDOSCOPIC PROCEDURE TODAY AT [facility]:   Refer to the procedure report that was given to you for any specific questions about what was found during the examination.  If the procedure report does not answer your questions, please call your gastroenterologist to clarify.  If you requested that your care partner not be given the details of your procedure findings, then the procedure report has been included in a sealed envelope for you to review at your convenience later.  YOU SHOULD EXPECT: Some feelings of bloating in the abdomen. Passage of more gas than usual.  Walking can help get rid of the air that was put into your GI tract during the procedure and reduce the bloating. If you had a lower endoscopy (such as a colonoscopy or flexible sigmoidoscopy) you may notice spotting of blood in your stool or on the toilet paper. If you underwent a bowel prep for your procedure, you may not have a normal bowel movement for a few days.  Please Note:  You might notice some irritation and congestion in your nose or some drainage.  This is from the oxygen used during your procedure.  There is no need for concern and it should clear up in a day or so.  SYMPTOMS TO REPORT IMMEDIATELY:   Following upper endoscopy (EGD)  Vomiting of blood or coffee ground material  New chest pain or pain under the shoulder blades  Painful or persistently difficult swallowing  New shortness of breath  Fever of 100F or higher  Black, tarry-looking stools  For urgent or emergent issues, a gastroenterologist can be reached at any hour by calling [phone].   DIET: Your first meal following the procedure should be a small meal and then it is ok to progress to your normal diet. Heavy or fried foods are harder to digest and may make you feel nauseous or bloated.  Likewise, meals heavy in dairy and vegetables can increase bloating.  Drink plenty of fluids but you should avoid alcoholic beverages for  24 hours.  ACTIVITY:  You should plan to take it easy for the rest of today and you should NOT DRIVE or use heavy machinery until tomorrow (because of the sedation medicines used during the test).    FOLLOW UP: Our staff will call the number listed on your records the next business day following your procedure to check on you and address any questions or concerns that you may have regarding the information given to you following your procedure. If we do not reach you, we will leave a message.  However, if you are feeling well and you are not experiencing any problems, there is no need to return our call.  We will assume that you have returned to your regular daily activities without incident.  If any biopsies were taken you will be contacted by phone or by letter within the next 1-3 weeks.  Please call us at [phone] if you have not heard about the biopsies in 3 weeks.    SIGNATURES/CONFIDENTIALITY: You and/or your care partner have signed paperwork which will be entered into your electronic medical record.  These signatures attest to the fact that that the information above on your After Visit Summary has been reviewed and is understood.  Full responsibility of the confidentiality of this discharge information lies with you and/or your care-partner.   Gluten free diet. Await pathology results.

## 2015-11-22 NOTE — Telephone Encounter (Signed)
That is fine 

## 2015-11-22 NOTE — Progress Notes (Signed)
Called to room to assist during endoscopic procedure.  Patient ID and intended procedure confirmed with present staff. Received instructions for my participation in the procedure from the performing physician.  

## 2015-11-22 NOTE — Progress Notes (Signed)
Report to PACU, RN, vss, BBS= Clear.  

## 2015-11-23 ENCOUNTER — Telehealth: Payer: Self-pay

## 2015-11-23 ENCOUNTER — Telehealth: Payer: Self-pay | Admitting: Internal Medicine

## 2015-11-23 NOTE — Telephone Encounter (Signed)
Rec'd from [facility] forward 15 pages to Dr. Hilarie Fredrickson

## 2015-11-23 NOTE — Telephone Encounter (Signed)
  Follow up Call-  Call back number 11/22/2015  Post procedure Call Back phone  # [phone]  Permission to leave phone message Yes    Patient expressed her appreciation for the care that she received in the [facility]. Patient states the staff was wonderful!  Patient questions:  Do you have a fever, pain , or abdominal swelling? No. Pain Score  0 *  Have you tolerated food without any problems? Yes.    Have you been able to return to your normal activities? Yes.    Do you have any questions about your discharge instructions: Diet   No. Medications  No. Follow up visit  No.  Do you have questions or concerns about your Care? No.  Actions: * If pain score is 4 or above: No action needed, pain <4.

## 2015-11-24 ENCOUNTER — Telehealth: Payer: Self-pay | Admitting: *Deleted

## 2015-11-24 MED ORDER — SUCRALFATE 1 G PO TABS: 1.0000 g | ORAL_TABLET | Freq: Three times a day (TID) | ORAL | Status: DC

## 2015-11-24 NOTE — Telephone Encounter (Signed)
[employer] has denied patient's carafate suspension. Patient must instead have tablets. I have called a prescription for sucralfate 1 gram tablets, 1 tablet before meals and at bedtime #120 with 1 refill. I have asked that pharmacy advise patient how to make into slurry form.

## 2015-12-13 ENCOUNTER — Ambulatory Visit (AMBULATORY_SURGERY_CENTER): Payer: Self-pay

## 2015-12-13 ENCOUNTER — Encounter: Payer: Self-pay | Admitting: Internal Medicine

## 2015-12-13 VITALS — Ht 65.0 in | Wt 150.4 lb

## 2015-12-13 DIAGNOSIS — Z860100 Personal history of colon polyps, unspecified: Secondary | ICD-10-CM

## 2015-12-13 MED ORDER — SUPREP BOWEL PREP KIT 17.5-3.13-1.6 GM/177ML PO SOLN: 1.0000 | Freq: Once | ORAL | Status: DC

## 2015-12-13 NOTE — Progress Notes (Signed)
No allergies to eggs or soy No diet meds No home oxygen No past problems with anesthesia  Has email and internet; declined emmi

## 2015-12-20 ENCOUNTER — Other Ambulatory Visit: Payer: Self-pay

## 2015-12-20 MED ORDER — MOMETASONE FUROATE 50 MCG/ACT NA SUSP: NASAL | 2 refills | Status: DC

## 2015-12-27 ENCOUNTER — Encounter: Payer: Self-pay | Admitting: Internal Medicine

## 2015-12-27 ENCOUNTER — Ambulatory Visit (AMBULATORY_SURGERY_CENTER): Payer: BLUE CROSS/BLUE SHIELD | Admitting: Internal Medicine

## 2015-12-27 VITALS — BP 124/77 | HR 88 | Temp 97.8°F | Resp 12 | Ht 65.0 in | Wt 150.0 lb

## 2015-12-27 DIAGNOSIS — Z86010 Personal history of colonic polyps: Secondary | ICD-10-CM | POA: Diagnosis not present

## 2015-12-27 DIAGNOSIS — D124 Benign neoplasm of descending colon: Secondary | ICD-10-CM

## 2015-12-27 DIAGNOSIS — K635 Polyp of colon: Secondary | ICD-10-CM | POA: Diagnosis not present

## 2015-12-27 MED ORDER — SODIUM CHLORIDE 0.9 % IV SOLN: 500.0000 mL | INTRAVENOUS | Status: DC

## 2015-12-27 NOTE — Progress Notes (Signed)
Called to room to assist during endoscopic procedure.  Patient ID and intended procedure confirmed with present staff. Received instructions for my participation in the procedure from the performing physician.  

## 2015-12-27 NOTE — Patient Instructions (Signed)
YOU HAD AN ENDOSCOPIC PROCEDURE TODAY AT [facility]:   Refer to the procedure report that was given to you for any specific questions about what was found during the examination.  If the procedure report does not answer your questions, please call your gastroenterologist to clarify.  If you requested that your care partner not be given the details of your procedure findings, then the procedure report has been included in a sealed envelope for you to review at your convenience later.  YOU SHOULD EXPECT: Some feelings of bloating in the abdomen. Passage of more gas than usual.  Walking can help get rid of the air that was put into your GI tract during the procedure and reduce the bloating. If you had a lower endoscopy (such as a colonoscopy or flexible sigmoidoscopy) you may notice spotting of blood in your stool or on the toilet paper. If you underwent a bowel prep for your procedure, you may not have a normal bowel movement for a few days.  Please Note:  You might notice some irritation and congestion in your nose or some drainage.  This is from the oxygen used during your procedure.  There is no need for concern and it should clear up in a day or so.  SYMPTOMS TO REPORT IMMEDIATELY:   Following lower endoscopy (colonoscopy or flexible sigmoidoscopy):  Excessive amounts of blood in the stool  Significant tenderness or worsening of abdominal pains  Swelling of the abdomen that is new, acute  Fever of 100F or higher   For urgent or emergent issues, a gastroenterologist can be reached at any hour by calling [phone].   DIET: Your first meal following the procedure should be a small meal and then it is ok to progress to your normal diet. Heavy or fried foods are harder to digest and may make you feel nauseous or bloated.  Likewise, meals heavy in dairy and vegetables can increase bloating.  Drink plenty of fluids but you should avoid alcoholic beverages for 24  hours.  ACTIVITY:  You should plan to take it easy for the rest of today and you should NOT DRIVE or use heavy machinery until tomorrow (because of the sedation medicines used during the test).    FOLLOW UP: Our staff will call the number listed on your records the next business day following your procedure to check on you and address any questions or concerns that you may have regarding the information given to you following your procedure. If we do not reach you, we will leave a message.  However, if you are feeling well and you are not experiencing any problems, there is no need to return our call.  We will assume that you have returned to your regular daily activities without incident.  If any biopsies were taken you will be contacted by phone or by letter within the next 1-3 weeks.  Please call us at [phone] if you have not heard about the biopsies in 3 weeks.    SIGNATURES/CONFIDENTIALITY: You and/or your care partner have signed paperwork which will be entered into your electronic medical record.  These signatures attest to the fact that that the information above on your After Visit Summary has been reviewed and is understood.  Full responsibility of the confidentiality of this discharge information lies with you and/or your care-partner.  Polyp-handout given  Repeat colonoscopy will be determined by pathology.

## 2015-12-27 NOTE — Progress Notes (Signed)
Report to PACU, RN, vss, BBS= Clear.  

## 2015-12-27 NOTE — Op Note (Signed)
[facility] Patient Name: Vicki [name] Procedure Date: 12/27/2015 1:20 PM MRN: [id number] Endoscopist: Jerene Bears , MD Age: 55 Referring MD:  Date of Birth: 1960/07/29 Gender: Female Account #: [id number] Procedure:                Colonoscopy Indications:              Surveillance: Personal history of adenomatous                            polyps on last colonoscopy 5 years ago (Dr.                            Algis Greenhouse December 2011) Medicines:                Monitored Anesthesia Care Procedure:                Pre-Anesthesia Assessment:                           - Prior to the procedure, a History and Physical                            was performed, and patient medications and                            allergies were reviewed. The patient's tolerance of                            previous anesthesia was also reviewed. The risks                            and benefits of the procedure and the sedation                            options and risks were discussed with the patient.                            All questions were answered, and informed consent                            was obtained. Prior Anticoagulants: The patient has                            taken no previous anticoagulant or antiplatelet                            agents. ASA Grade Assessment: II - A patient with                            mild systemic disease. After reviewing the risks                            and benefits, the patient was deemed in  satisfactory condition to undergo the procedure.                           After obtaining informed consent, the colonoscope                            was passed under direct vision. Throughout the                            procedure, the patient's blood pressure, pulse, and                            oxygen saturations were monitored continuously. The                            Model PCF-H190L [id number]) scope was  introduced                            through the anus and advanced to the the terminal                            ileum. The colonoscopy was performed without                            difficulty. The patient tolerated the procedure                            well. The quality of the bowel preparation was                            good. The terminal ileum, ileocecal valve,                            appendiceal orifice, and rectum were photographed. Scope In: 1:46:55 PM Scope Out: 2:03:55 PM Scope Withdrawal Time: 0 hours 11 minutes 26 seconds  Total Procedure Duration: 0 hours 17 minutes 0 seconds  Findings:                 The digital rectal exam was normal.                           The terminal ileum appeared normal.                           A 5 mm polyp was found in the descending colon. The                            polyp was sessile. The polyp was removed with a                            cold snare. Resection and retrieval were complete.                           The exam was otherwise without abnormality on  direct and retroflexion views. Complications:            No immediate complications. Estimated Blood Loss:     Estimated blood loss: none. Impression:               - The examined portion of the ileum was normal.                           - One 5 mm polyp in the descending colon, removed                            with a cold snare. Resected and retrieved.                           - The examination was otherwise normal on direct                            and retroflexion views. Recommendation:           - Patient has a contact number available for                            emergencies. The signs and symptoms of potential                            delayed complications were discussed with the                            patient. Return to normal activities tomorrow.                            Written discharge instructions were provided to  the                            patient.                           - Resume previous diet.                           - Continue present medications.                           - Await pathology results.                           - Repeat colonoscopy in 5 years for surveillance. Jerene Bears, MD 12/27/2015 2:42:35 PM This report has been signed electronically.

## 2015-12-28 ENCOUNTER — Telehealth: Payer: Self-pay

## 2015-12-28 NOTE — Telephone Encounter (Signed)
  Follow up Call-  Call back number 12/27/2015 11/22/2015  Post procedure Call Back phone  # [phone]  Permission to leave phone message Yes Yes  Some recent data might be hidden     Patient questions:  Do you have a fever, pain , or abdominal swelling? No. Pain Score  0 *  Have you tolerated food without any problems? Yes.    Have you been able to return to your normal activities? Yes.    Do you have any questions about your discharge instructions: Diet   No. Medications  No. Follow up visit  No.  Do you have questions or concerns about your Care? No.  Actions: * If pain score is 4 or above: No action needed, pain <4.

## 2016-01-01 ENCOUNTER — Encounter: Payer: Self-pay | Admitting: Internal Medicine

## 2016-01-11 ENCOUNTER — Ambulatory Visit: Payer: BLUE CROSS/BLUE SHIELD | Admitting: Internal Medicine

## 2016-03-17 ENCOUNTER — Other Ambulatory Visit: Payer: Self-pay | Admitting: Family Medicine

## 2016-04-03 ENCOUNTER — Other Ambulatory Visit: Payer: Self-pay | Admitting: Gastroenterology

## 2016-04-17 ENCOUNTER — Telehealth: Payer: Self-pay | Admitting: Internal Medicine

## 2016-04-17 NOTE — Telephone Encounter (Signed)
Pt states she has been taking protonix and florastor for several weeks and she has noticed that after she has a BM she is having itching around her anus. She wanted to know if this is a common side effect of the meds. Let pt know that this is not a common side effect of protonix and she may want to contact her PCP regarding this issue. Pt verbalized understanding.

## 2016-05-03 ENCOUNTER — Encounter: Payer: Self-pay | Admitting: *Deleted

## 2016-05-15 ENCOUNTER — Other Ambulatory Visit: Payer: Self-pay | Admitting: Family Medicine

## 2016-06-09 ENCOUNTER — Other Ambulatory Visit: Payer: Self-pay | Admitting: Family Medicine

## 2016-06-11 ENCOUNTER — Ambulatory Visit (INDEPENDENT_AMBULATORY_CARE_PROVIDER_SITE_OTHER): Payer: BLUE CROSS/BLUE SHIELD | Admitting: Internal Medicine

## 2016-06-11 ENCOUNTER — Other Ambulatory Visit: Payer: BLUE CROSS/BLUE SHIELD

## 2016-06-11 ENCOUNTER — Encounter: Payer: Self-pay | Admitting: Internal Medicine

## 2016-06-11 VITALS — BP 128/72 | HR 66 | Ht 65.0 in | Wt 139.0 lb

## 2016-06-11 DIAGNOSIS — R140 Abdominal distension (gaseous): Secondary | ICD-10-CM

## 2016-06-11 DIAGNOSIS — R198 Other specified symptoms and signs involving the digestive system and abdomen: Secondary | ICD-10-CM | POA: Diagnosis not present

## 2016-06-11 DIAGNOSIS — R195 Other fecal abnormalities: Secondary | ICD-10-CM

## 2016-06-11 DIAGNOSIS — R194 Change in bowel habit: Secondary | ICD-10-CM

## 2016-06-11 DIAGNOSIS — K900 Celiac disease: Secondary | ICD-10-CM

## 2016-06-11 MED ORDER — RIFAXIMIN 550 MG PO TABS: 550.0000 mg | ORAL_TABLET | Freq: Three times a day (TID) | ORAL | 0 refills | Status: DC

## 2016-06-11 NOTE — Progress Notes (Signed)
Subjective:    Patient ID: Vicki [name], female    DOB: 1961-05-01, 56 y.o.   MRN: [id number]  HPI Vicki [name] is a 56 year old female with a past medical history of celiac disease, intermittent issues with reflux disease, hypothyroidism, migraines who is here for follow-up. She came for upper endoscopy and colonoscopy on 12/27/2015.   EGD revealed a normal esophagus, a widely patent Schatzki's ring, normal stomach and normal duodenum. The stomach and duodenum were biopsied. There was mild chronic gastritis negative for Helicobacter pylori. The duodenum showed mild peptic duodenitis with no villous blunting or increase in intraepithelial lymphocytes. There was no dysplasia.  Colonoscopy revealed a 5 mm polyp in the descending colon removed with cold snare and otherwise normal exam. This polyp was hyperplastic without adenomatous change.  She has continued gluten-free diet.  Since her endoscopic procedures she has had looser stools with gas, bloating, borborygmi and some fecal seepage with passing gas. This is been a change for her. She tried Florastor twice daily for several months and it didn't seem to help much. She noticed some anal itching associated with taking this medication. She tried Lactaid products without much benefit and then has been dairy free which has resulted in less belching and less gas but still some bloating and looser stools. She stresses that this is not diarrhea but just simply looser stools. There is been some lower abdominal crampy discomfort. No blood in her stool or melena. No fever or chills. Strict gluten-free diet has continued. She uses pantoprazole intermittently for indigestion type symptoms without true heartburn. She is not using pantoprazole recently.  Review of Systems As per history of present illness, otherwise negative  Current Medications, Allergies, Past Medical History, Past Surgical History, Family History and Social History were reviewed in  [employer] record.     Objective:   Physical Exam BP 128/72   Pulse 66   Ht 5' 5"  (1.651 m)   Wt 139 lb (63 kg)   BMI 23.13 kg/m  Constitutional: Well-developed and well-nourished. No distress. HEENT: Normocephalic and atraumatic. Marland Kitchen Conjunctivae are normal.  No scleral icterus. Neck: Neck supple. Trachea midline. Cardiovascular: Normal rate, regular rhythm and intact distal pulses. No M/R/G Pulmonary/chest: Effort normal and breath sounds normal. No wheezing, rales or rhonchi. Abdominal: Soft, nontender, nondistended. Bowel sounds active throughout. There are no masses palpable. No hepatosplenomegaly. Extremities: no clubbing, cyanosis, or edema Neurological: Alert and oriented to person place and time. Skin: Skin is warm and dry.  Psychiatric: Normal mood and affect. Behavior is normal.  CBC    Component Value Date/Time   WBC 6.4 11/09/2015 1203   RBC 4.33 11/09/2015 1203   HGB 13.2 11/09/2015 1203   HCT 40.2 11/09/2015 1203   PLT 249.0 11/09/2015 1203   MCV 92.6 11/09/2015 1203   MCHC 33.0 11/09/2015 1203   RDW 13.2 11/09/2015 1203   LYMPHSABS 1.7 11/09/2015 1203   MONOABS 0.5 11/09/2015 1203   EOSABS 0.5 11/09/2015 1203   BASOSABS 0.0 11/09/2015 1203   CMP     Component Value Date/Time   NA 139 11/09/2015 1203   K 4.2 11/09/2015 1203   CL 103 11/09/2015 1203   CO2 30 11/09/2015 1203   GLUCOSE 93 11/09/2015 1203   BUN 9 11/09/2015 1203   CREATININE 0.84 11/09/2015 1203   CALCIUM 10.1 11/09/2015 1203   PROT 7.5 11/09/2015 1203   ALBUMIN 4.7 11/09/2015 1203   AST 19 11/09/2015 1203   ALT 14 11/09/2015  4961   TEIHDTP 12 11/09/2015 1203   BILITOT 0.5 11/09/2015 1203   TTG normal     Assessment & Plan:   56 year old female with a past medical history of celiac disease, intermittent issues with reflux disease, hypothyroidism, migraines who is here for follow-up.   1. 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 I cannot completely exclude that this wasn't related to colonoscopy prep and procedure. We discussed this today. Her celiac disease is in good control and she continue strict gluten-free diet. I recommended testing stool for parasite, specifically Giardia and if negative then rifaximin 550 mg 3 times a day 14 days. I asked that she notify me a few weeks after completing rifaximin to see if her symptoms have improved.  2. Celiac disease -- continue gluten-free diet  25 minutes spent with the patient today. Greater than 50% was spent in counseling and coordination of care with the patient

## 2016-06-11 NOTE — Patient Instructions (Signed)
We have sent your demographic information and a prescription for Xifaxan to [facility]. This pharmacy is able to get medication approved through insurance and get you the lowest copay possible. If you have not heard from them within 1 week, please call our office at [phone] to let us know.  Your physician has requested that you go to the basement for the following lab work before leaving today: Ova and parasite  If you are age 57 or older, your body mass index should be between 23-30. Your Body mass index is 23.13 kg/m. If this is out of the aforementioned range listed, please consider follow up with your Primary Care Provider.  If you are age 50 or younger, your body mass index should be between 19-25. Your Body mass index is 23.13 kg/m. If this is out of the aformentioned range listed, please consider follow up with your Primary Care Provider.

## 2016-06-12 ENCOUNTER — Other Ambulatory Visit: Payer: BLUE CROSS/BLUE SHIELD

## 2016-06-12 ENCOUNTER — Telehealth: Payer: Self-pay | Admitting: Internal Medicine

## 2016-06-12 DIAGNOSIS — R194 Change in bowel habit: Secondary | ICD-10-CM

## 2016-06-12 DIAGNOSIS — R140 Abdominal distension (gaseous): Secondary | ICD-10-CM

## 2016-06-12 DIAGNOSIS — R195 Other fecal abnormalities: Secondary | ICD-10-CM

## 2016-06-12 NOTE — Telephone Encounter (Signed)
Chartnotes sent

## 2016-06-13 LAB — OVA AND PARASITE EXAMINATION: OP: NONE SEEN

## 2016-06-18 ENCOUNTER — Other Ambulatory Visit: Payer: Self-pay

## 2016-06-19 NOTE — Telephone Encounter (Signed)
Per [employer], Xifaxan has been approved from 06/19/16-06/18/17. Reference [id number].

## 2016-07-02 DIAGNOSIS — J301 Allergic rhinitis due to pollen: Secondary | ICD-10-CM | POA: Diagnosis not present

## 2016-07-02 DIAGNOSIS — J3081 Allergic rhinitis due to animal (cat) (dog) hair and dander: Secondary | ICD-10-CM | POA: Diagnosis not present

## 2016-07-02 DIAGNOSIS — J3089 Other allergic rhinitis: Secondary | ICD-10-CM | POA: Diagnosis not present

## 2016-07-05 DIAGNOSIS — J301 Allergic rhinitis due to pollen: Secondary | ICD-10-CM | POA: Diagnosis not present

## 2016-07-05 DIAGNOSIS — J3081 Allergic rhinitis due to animal (cat) (dog) hair and dander: Secondary | ICD-10-CM | POA: Diagnosis not present

## 2016-07-05 DIAGNOSIS — J3089 Other allergic rhinitis: Secondary | ICD-10-CM | POA: Diagnosis not present

## 2016-07-08 ENCOUNTER — Other Ambulatory Visit: Payer: Self-pay | Admitting: Family Medicine

## 2016-07-10 DIAGNOSIS — J3081 Allergic rhinitis due to animal (cat) (dog) hair and dander: Secondary | ICD-10-CM | POA: Diagnosis not present

## 2016-07-10 DIAGNOSIS — J301 Allergic rhinitis due to pollen: Secondary | ICD-10-CM | POA: Diagnosis not present

## 2016-07-13 DIAGNOSIS — J301 Allergic rhinitis due to pollen: Secondary | ICD-10-CM | POA: Diagnosis not present

## 2016-07-13 DIAGNOSIS — J3081 Allergic rhinitis due to animal (cat) (dog) hair and dander: Secondary | ICD-10-CM | POA: Diagnosis not present

## 2016-07-13 DIAGNOSIS — J3089 Other allergic rhinitis: Secondary | ICD-10-CM | POA: Diagnosis not present

## 2016-07-16 DIAGNOSIS — J3081 Allergic rhinitis due to animal (cat) (dog) hair and dander: Secondary | ICD-10-CM | POA: Diagnosis not present

## 2016-07-16 DIAGNOSIS — J301 Allergic rhinitis due to pollen: Secondary | ICD-10-CM | POA: Diagnosis not present

## 2016-07-16 DIAGNOSIS — J3089 Other allergic rhinitis: Secondary | ICD-10-CM | POA: Diagnosis not present

## 2016-07-18 DIAGNOSIS — J3081 Allergic rhinitis due to animal (cat) (dog) hair and dander: Secondary | ICD-10-CM | POA: Diagnosis not present

## 2016-07-18 DIAGNOSIS — J3089 Other allergic rhinitis: Secondary | ICD-10-CM | POA: Diagnosis not present

## 2016-07-18 DIAGNOSIS — J301 Allergic rhinitis due to pollen: Secondary | ICD-10-CM | POA: Diagnosis not present

## 2016-07-24 ENCOUNTER — Other Ambulatory Visit: Payer: Self-pay | Admitting: Family Medicine

## 2016-07-24 DIAGNOSIS — J3081 Allergic rhinitis due to animal (cat) (dog) hair and dander: Secondary | ICD-10-CM | POA: Diagnosis not present

## 2016-07-24 DIAGNOSIS — J301 Allergic rhinitis due to pollen: Secondary | ICD-10-CM | POA: Diagnosis not present

## 2016-07-24 DIAGNOSIS — J3089 Other allergic rhinitis: Secondary | ICD-10-CM | POA: Diagnosis not present

## 2016-07-31 DIAGNOSIS — J3089 Other allergic rhinitis: Secondary | ICD-10-CM | POA: Diagnosis not present

## 2016-07-31 DIAGNOSIS — J3081 Allergic rhinitis due to animal (cat) (dog) hair and dander: Secondary | ICD-10-CM | POA: Diagnosis not present

## 2016-07-31 DIAGNOSIS — J301 Allergic rhinitis due to pollen: Secondary | ICD-10-CM | POA: Diagnosis not present

## 2016-08-07 DIAGNOSIS — J3089 Other allergic rhinitis: Secondary | ICD-10-CM | POA: Diagnosis not present

## 2016-08-07 DIAGNOSIS — J3081 Allergic rhinitis due to animal (cat) (dog) hair and dander: Secondary | ICD-10-CM | POA: Diagnosis not present

## 2016-08-07 DIAGNOSIS — J301 Allergic rhinitis due to pollen: Secondary | ICD-10-CM | POA: Diagnosis not present

## 2016-08-14 DIAGNOSIS — J301 Allergic rhinitis due to pollen: Secondary | ICD-10-CM | POA: Diagnosis not present

## 2016-08-14 DIAGNOSIS — J3089 Other allergic rhinitis: Secondary | ICD-10-CM | POA: Diagnosis not present

## 2016-08-14 DIAGNOSIS — J3081 Allergic rhinitis due to animal (cat) (dog) hair and dander: Secondary | ICD-10-CM | POA: Diagnosis not present

## 2016-08-21 DIAGNOSIS — J3081 Allergic rhinitis due to animal (cat) (dog) hair and dander: Secondary | ICD-10-CM | POA: Diagnosis not present

## 2016-08-21 DIAGNOSIS — J3089 Other allergic rhinitis: Secondary | ICD-10-CM | POA: Diagnosis not present

## 2016-08-21 DIAGNOSIS — J301 Allergic rhinitis due to pollen: Secondary | ICD-10-CM | POA: Diagnosis not present

## 2016-08-28 DIAGNOSIS — J3089 Other allergic rhinitis: Secondary | ICD-10-CM | POA: Diagnosis not present

## 2016-08-28 DIAGNOSIS — J301 Allergic rhinitis due to pollen: Secondary | ICD-10-CM | POA: Diagnosis not present

## 2016-08-28 DIAGNOSIS — J3081 Allergic rhinitis due to animal (cat) (dog) hair and dander: Secondary | ICD-10-CM | POA: Diagnosis not present

## 2016-09-04 DIAGNOSIS — J3089 Other allergic rhinitis: Secondary | ICD-10-CM | POA: Diagnosis not present

## 2016-09-04 DIAGNOSIS — J301 Allergic rhinitis due to pollen: Secondary | ICD-10-CM | POA: Diagnosis not present

## 2016-09-04 DIAGNOSIS — J3081 Allergic rhinitis due to animal (cat) (dog) hair and dander: Secondary | ICD-10-CM | POA: Diagnosis not present

## 2016-09-07 ENCOUNTER — Encounter: Payer: Self-pay | Admitting: Family Medicine

## 2016-09-07 ENCOUNTER — Ambulatory Visit (INDEPENDENT_AMBULATORY_CARE_PROVIDER_SITE_OTHER): Payer: BLUE CROSS/BLUE SHIELD | Admitting: Family Medicine

## 2016-09-07 VITALS — BP 108/62 | HR 68 | Temp 98.6°F | Wt 134.2 lb

## 2016-09-07 DIAGNOSIS — R634 Abnormal weight loss: Secondary | ICD-10-CM | POA: Diagnosis not present

## 2016-09-07 DIAGNOSIS — R0609 Other forms of dyspnea: Secondary | ICD-10-CM

## 2016-09-07 DIAGNOSIS — R5383 Other fatigue: Secondary | ICD-10-CM

## 2016-09-07 LAB — TSH: TSH: 1.87 u[IU]/mL (ref 0.35–4.50)

## 2016-09-07 LAB — HEPATIC FUNCTION PANEL
ALBUMIN: 4.5 g/dL (ref 3.5–5.2)
ALK PHOS: 47 U/L (ref 39–117)
ALT: 12 U/L (ref 0–35)
AST: 18 U/L (ref 0–37)
Bilirubin, Direct: 0.1 mg/dL (ref 0.0–0.3)
TOTAL PROTEIN: 6.7 g/dL (ref 6.0–8.3)
Total Bilirubin: 0.5 mg/dL (ref 0.2–1.2)

## 2016-09-07 LAB — CBC WITH DIFFERENTIAL/PLATELET
BASOS PCT: 1.3 % (ref 0.0–3.0)
Basophils Absolute: 0.1 10*3/uL (ref 0.0–0.1)
Eosinophils Absolute: 0.4 10*3/uL (ref 0.0–0.7)
Eosinophils Relative: 8.4 % — ABNORMAL HIGH (ref 0.0–5.0)
HCT: 38.1 % (ref 36.0–46.0)
Hemoglobin: 12.7 g/dL (ref 12.0–15.0)
LYMPHS ABS: 1.7 10*3/uL (ref 0.7–4.0)
Lymphocytes Relative: 32.9 % (ref 12.0–46.0)
MCHC: 33.3 g/dL (ref 30.0–36.0)
MCV: 93.4 fl (ref 78.0–100.0)
MONOS PCT: 6.7 % (ref 3.0–12.0)
Monocytes Absolute: 0.3 10*3/uL (ref 0.1–1.0)
NEUTROS ABS: 2.6 10*3/uL (ref 1.4–7.7)
NEUTROS PCT: 50.7 % (ref 43.0–77.0)
PLATELETS: 202 10*3/uL (ref 150.0–400.0)
RBC: 4.08 Mil/uL (ref 3.87–5.11)
RDW: 12.9 % (ref 11.5–15.5)
WBC: 5 10*3/uL (ref 4.0–10.5)

## 2016-09-07 LAB — BASIC METABOLIC PANEL
BUN: 11 mg/dL (ref 6–23)
CHLORIDE: 102 meq/L (ref 96–112)
CO2: 32 meq/L (ref 19–32)
Calcium: 9.6 mg/dL (ref 8.4–10.5)
Creatinine, Ser: 0.91 mg/dL (ref 0.40–1.20)
GFR: 67.69 mL/min (ref >60.00)
GLUCOSE: 101 mg/dL — AB (ref 70–99)
POTASSIUM: 4.1 meq/L (ref 3.5–5.1)
SODIUM: 140 meq/L (ref 135–145)

## 2016-09-07 LAB — SEDIMENTATION RATE: SED RATE: 6 mm/h (ref 0–30)

## 2016-09-07 NOTE — Progress Notes (Signed)
Pre visit review using our clinic review tool, if applicable. No additional management support is needed unless otherwise documented below in the visit note. 

## 2016-09-07 NOTE — Progress Notes (Signed)
Subjective:     Patient ID: Vicki [name], female   DOB: [birth year], 56 y.o.   MRN: [id number]  HPI Patient seen with chief complaint of increasing fatigue especially the past couple months. She has also noted that she sometimes has increased shortness of breath with things like going up incline. She's had no chest pain whatsoever. She has had some gradual weight loss. Her weight was 154 pounds last April, 148 pounds last June, 139 pounds in January and currently 134 pounds. She states she has good appetite. No abdominal pain. She does have history of celiac disease and is cautious to avoid glutens. Denies any recent headaches, cough, arthralgias, body aches, skin rash. Denies depression symptoms. Still walking about 1 mile per day. Sleeping okay.  She had TSH last June which was normal. She has had loose stools in the past and within the past year have been treated on one occasion with Xifaxan which seemed to help. This was prescribed per GI. She does have migraine headaches but these are somewhat infrequent  Wt Readings from Last 3 Encounters:  09/07/16 134 lb 3.2 oz (60.9 kg)  06/11/16 139 lb (63 kg)  12/27/15 150 lb (68 kg)     Past Medical History:  Diagnosis Date  . Allergy   . Anxiety    hx ?panic disorder  . Asthma   . Celiac disease   . Chronic gastritis   . GERD (gastroesophageal reflux disease)   . Hyperplastic colon polyp   . Migraines    chronic  . Schatzki's ring   . Skin cancer    chin area  . Thyroid disease    Past Surgical History:  Procedure Laterality Date  . ABLATION  2012  . CESAREAN SECTION  1984  . COLONOSCOPY  2011  . DILATION AND CURETTAGE OF UTERUS  2012  . POLYPECTOMY    . TONSILLECTOMY AND ADENOIDECTOMY  1964  . TUBAL LIGATION  1998  . UPPER GASTROINTESTINAL ENDOSCOPY  2011,1998    reports that she has never smoked. She has never used smokeless tobacco. She reports that she does not drink alcohol or use drugs. family history includes Alcohol  abuse in her father and mother; Arthritis in her maternal grandmother; Asthma in her paternal aunt; Cancer in her mother; Cancer (age of onset: 62) in her sister; Colon cancer in her paternal aunt and paternal grandfather; Irritable bowel syndrome in her sister; Lupus in her sister; Stroke in her father. Allergies  Allergen Reactions  . Ceclor [Cefaclor]     hives  . Lamisil [Terbinafine Hcl] Hives  . Zoster Vaccine Live Rash     Review of Systems  Constitutional: Positive for fatigue and unexpected weight change. Negative for activity change, appetite change and fever.  HENT: Negative for ear pain, hearing loss, sore throat, trouble swallowing and voice change.   Eyes: Negative for visual disturbance.  Respiratory: Negative for cough and shortness of breath.   Cardiovascular: Negative for chest pain and palpitations.  Gastrointestinal: Negative for abdominal pain, blood in stool, constipation and vomiting.  Genitourinary: Negative for dysuria and hematuria.  Musculoskeletal: Negative for arthralgias, back pain and myalgias.  Skin: Negative for rash.  Neurological: Negative for dizziness, syncope and headaches.  Hematological: Negative for adenopathy.  Psychiatric/Behavioral: Negative for confusion and dysphoric mood.       Objective:   Physical Exam  Constitutional: She is oriented to person, place, and time. She appears well-developed and well-nourished.  HENT:  Mouth/Throat: Oropharynx is clear and  moist.  Neck: Neck supple. No thyromegaly present.  Cardiovascular: Normal rate and regular rhythm.   Pulmonary/Chest: Effort normal and breath sounds normal. No respiratory distress. She has no wheezes. She has no rales.  Abdominal: Soft. She exhibits no mass. There is no tenderness. There is no rebound and no guarding.  Musculoskeletal: She exhibits no edema.  Lymphadenopathy:    She has no cervical adenopathy.  Neurological: She is alert and oriented to person, place, and time.  No cranial nerve deficit.  Skin: No rash noted.  Psychiatric: She has a normal mood and affect. Her behavior is normal. Judgment and thought content normal.       Assessment:     Patient presents with nonspecific symptoms of fatigue and gradual weight loss over several months though with normal appetite. She has nonfocal exam. She does not clinically apppear to be anemic. She does not have any body aches to suggest likely polymyalgia or other rheumatologic disorder. PHQ-9 score of 1.    Plan:     -Start with screening labs with CBC, comprehensive metabolic panel, TSH, sedimentation rate -Consider echocardiogram, especially if she has any progressive dyspnea.  Eulas Post MD [facility] at [facility]

## 2016-09-10 DIAGNOSIS — J301 Allergic rhinitis due to pollen: Secondary | ICD-10-CM | POA: Diagnosis not present

## 2016-09-10 DIAGNOSIS — J3081 Allergic rhinitis due to animal (cat) (dog) hair and dander: Secondary | ICD-10-CM | POA: Diagnosis not present

## 2016-09-10 DIAGNOSIS — J3089 Other allergic rhinitis: Secondary | ICD-10-CM | POA: Diagnosis not present

## 2016-09-10 NOTE — Addendum Note (Signed)
Addended by: Eulas Post on: 09/10/2016 08:20 AM   Modules accepted: Orders

## 2016-09-19 DIAGNOSIS — J3089 Other allergic rhinitis: Secondary | ICD-10-CM | POA: Diagnosis not present

## 2016-09-19 DIAGNOSIS — J301 Allergic rhinitis due to pollen: Secondary | ICD-10-CM | POA: Diagnosis not present

## 2016-09-19 DIAGNOSIS — J3081 Allergic rhinitis due to animal (cat) (dog) hair and dander: Secondary | ICD-10-CM | POA: Diagnosis not present

## 2016-09-24 ENCOUNTER — Other Ambulatory Visit (HOSPITAL_COMMUNITY): Payer: BLUE CROSS/BLUE SHIELD

## 2016-09-25 DIAGNOSIS — J3081 Allergic rhinitis due to animal (cat) (dog) hair and dander: Secondary | ICD-10-CM | POA: Diagnosis not present

## 2016-09-25 DIAGNOSIS — J301 Allergic rhinitis due to pollen: Secondary | ICD-10-CM | POA: Diagnosis not present

## 2016-09-25 DIAGNOSIS — J3089 Other allergic rhinitis: Secondary | ICD-10-CM | POA: Diagnosis not present

## 2016-10-05 DIAGNOSIS — J3089 Other allergic rhinitis: Secondary | ICD-10-CM | POA: Diagnosis not present

## 2016-10-05 DIAGNOSIS — J301 Allergic rhinitis due to pollen: Secondary | ICD-10-CM | POA: Diagnosis not present

## 2016-10-05 DIAGNOSIS — J3081 Allergic rhinitis due to animal (cat) (dog) hair and dander: Secondary | ICD-10-CM | POA: Diagnosis not present

## 2016-10-10 DIAGNOSIS — J3089 Other allergic rhinitis: Secondary | ICD-10-CM | POA: Diagnosis not present

## 2016-10-10 DIAGNOSIS — J3081 Allergic rhinitis due to animal (cat) (dog) hair and dander: Secondary | ICD-10-CM | POA: Diagnosis not present

## 2016-10-10 DIAGNOSIS — J301 Allergic rhinitis due to pollen: Secondary | ICD-10-CM | POA: Diagnosis not present

## 2016-10-11 ENCOUNTER — Encounter: Payer: Self-pay | Admitting: Internal Medicine

## 2016-10-11 ENCOUNTER — Other Ambulatory Visit: Payer: Self-pay

## 2016-10-11 ENCOUNTER — Other Ambulatory Visit: Payer: Self-pay | Admitting: Internal Medicine

## 2016-10-11 ENCOUNTER — Telehealth: Payer: Self-pay | Admitting: Internal Medicine

## 2016-10-11 MED ORDER — RIFAXIMIN 550 MG PO TABS: 550.0000 mg | ORAL_TABLET | Freq: Three times a day (TID) | ORAL | 0 refills | Status: DC

## 2016-10-11 NOTE — Telephone Encounter (Signed)
[facility] for xifaxan.

## 2016-10-11 NOTE — Telephone Encounter (Signed)
Please let patient know I read her email I would recommend that we retreat with rifaximin which is usually approved up to 3 times per year Would treat IBS-D with rifaximin 550 mg 3 times a day 14 days Have her let me know if symptoms fail to improve after repeat rifaximin

## 2016-10-12 ENCOUNTER — Telehealth: Payer: Self-pay | Admitting: Internal Medicine

## 2016-10-12 NOTE — Telephone Encounter (Signed)
Noted  

## 2016-10-16 DIAGNOSIS — J301 Allergic rhinitis due to pollen: Secondary | ICD-10-CM | POA: Diagnosis not present

## 2016-10-16 DIAGNOSIS — J3081 Allergic rhinitis due to animal (cat) (dog) hair and dander: Secondary | ICD-10-CM | POA: Diagnosis not present

## 2016-10-16 DIAGNOSIS — J3089 Other allergic rhinitis: Secondary | ICD-10-CM | POA: Diagnosis not present

## 2016-10-21 ENCOUNTER — Other Ambulatory Visit: Payer: Self-pay | Admitting: Internal Medicine

## 2016-10-21 DIAGNOSIS — K219 Gastro-esophageal reflux disease without esophagitis: Secondary | ICD-10-CM

## 2016-10-22 ENCOUNTER — Other Ambulatory Visit: Payer: Self-pay | Admitting: Family Medicine

## 2016-10-23 ENCOUNTER — Ambulatory Visit (INDEPENDENT_AMBULATORY_CARE_PROVIDER_SITE_OTHER): Payer: BLUE CROSS/BLUE SHIELD | Admitting: Family Medicine

## 2016-10-23 ENCOUNTER — Encounter: Payer: Self-pay | Admitting: Family Medicine

## 2016-10-23 VITALS — BP 100/56 | HR 74 | Temp 98.5°F | Wt 133.6 lb

## 2016-10-23 DIAGNOSIS — T63441A Toxic effect of venom of bees, accidental (unintentional), initial encounter: Secondary | ICD-10-CM | POA: Diagnosis not present

## 2016-10-23 MED ORDER — PREDNISONE 10 MG PO TABS: ORAL_TABLET | ORAL | 0 refills | Status: DC

## 2016-10-23 NOTE — Patient Instructions (Addendum)
WE NOW OFFER   [name] FAST TRACK!!!  SAME DAY Appointments for ACUTE CARE  Such as: Sprains, Injuries, cuts, abrasions, rashes, muscle pain, joint pain, back pain Colds, flu, sore throats, headache, allergies, cough, fever  Ear pain, sinus and eye infections Abdominal pain, nausea, vomiting, diarrhea, upset stomach Animal/insect bites  3 Easy Ways to Schedule: Walk-In Scheduling Call in scheduling Mychart Sign-up: [email or web address]  Continue with cool compresses and oral anti-histamine as needed. Follow up for any fever or other concerns.

## 2016-10-23 NOTE — Progress Notes (Signed)
Subjective:     Patient ID: Vicki [name], female   DOB: [birth year], 56 y.o.   MRN: [id number]  HPI   Patient seen with redness itching and swelling left lateral hip region following WASP sting Saturday. She had some initial swelling and took some oral Benadryl along with Benadryl cream and icing Saturday night. By Sunday she had increased redness and itching. She has noted some superficial blistering today. Mostly pruritic but slightly tender. No fevers or chills.  Past Medical History:  Diagnosis Date  . Allergy   . Anxiety    hx ?panic disorder  . Asthma   . Celiac disease   . Chronic gastritis   . GERD (gastroesophageal reflux disease)   . Hyperplastic colon polyp   . Migraines    chronic  . Schatzki's ring   . Skin cancer    chin area  . Thyroid disease    Past Surgical History:  Procedure Laterality Date  . ABLATION  2012  . CESAREAN SECTION  1984  . COLONOSCOPY  2011  . DILATION AND CURETTAGE OF UTERUS  2012  . POLYPECTOMY    . TONSILLECTOMY AND ADENOIDECTOMY  1964  . TUBAL LIGATION  1998  . UPPER GASTROINTESTINAL ENDOSCOPY  2011,1998    reports that she has never smoked. She has never used smokeless tobacco. She reports that she does not drink alcohol or use drugs. family history includes Alcohol abuse in her father and mother; Arthritis in her maternal grandmother; Asthma in her paternal aunt; Cancer in her mother; Cancer (age of onset: 80) in her sister; Colon cancer in her paternal aunt and paternal grandfather; Irritable bowel syndrome in her sister; Lupus in her sister; Stroke in her father. Allergies  Allergen Reactions  . Ceclor [Cefaclor]     hives  . Lamisil [Terbinafine Hcl] Hives  . Zoster Vaccine Live Rash     Review of Systems  Constitutional: Negative for chills and fever.       Objective:   Physical Exam  Constitutional: She appears well-developed and well-nourished.  Cardiovascular: Normal rate and regular rhythm.   Pulmonary/Chest:  Effort normal and breath sounds normal. No respiratory distress. She has no wheezes. She has no rales.  Skin:  Patient has 6 x 3 cm area of erythema. Minimal warmth. Nontender. Blanching. She has punctate center at the site of sting       Assessment:     WASP sting with local allergic response most likely. Doubt secondary infection. Gave option of prednisone taper for severe itching.    Plan:     -continue with cool compresses or icing for swelling -continue with benadryl as needed for itching  Eulas Post MD [facility]

## 2016-10-24 ENCOUNTER — Telehealth: Payer: Self-pay

## 2016-10-24 NOTE — Telephone Encounter (Signed)
Received PA request for Paxil. Pa submitted & pending. Key: [facility]

## 2016-10-25 ENCOUNTER — Telehealth: Payer: Self-pay | Admitting: Family Medicine

## 2016-10-25 NOTE — Telephone Encounter (Signed)
Dr Elease Hashimoto,  Patient informed our office that she didn't feel she needed the Echo and that her insurance would not pay for it.  I have removed the order from the system and at this time we will not reach out to her for appt.   Thank you for the Referral [facility]

## 2016-10-29 DIAGNOSIS — T781XXA Other adverse food reactions, not elsewhere classified, initial encounter: Secondary | ICD-10-CM | POA: Diagnosis not present

## 2016-10-29 DIAGNOSIS — J3081 Allergic rhinitis due to animal (cat) (dog) hair and dander: Secondary | ICD-10-CM | POA: Diagnosis not present

## 2016-10-29 DIAGNOSIS — J301 Allergic rhinitis due to pollen: Secondary | ICD-10-CM | POA: Diagnosis not present

## 2016-10-29 DIAGNOSIS — J3089 Other allergic rhinitis: Secondary | ICD-10-CM | POA: Diagnosis not present

## 2016-10-29 NOTE — Telephone Encounter (Signed)
Thanks

## 2016-11-03 ENCOUNTER — Other Ambulatory Visit: Payer: Self-pay | Admitting: Family Medicine

## 2016-11-05 DIAGNOSIS — J3089 Other allergic rhinitis: Secondary | ICD-10-CM | POA: Diagnosis not present

## 2016-11-05 DIAGNOSIS — J301 Allergic rhinitis due to pollen: Secondary | ICD-10-CM | POA: Diagnosis not present

## 2016-11-05 DIAGNOSIS — J3081 Allergic rhinitis due to animal (cat) (dog) hair and dander: Secondary | ICD-10-CM | POA: Diagnosis not present

## 2016-11-12 ENCOUNTER — Ambulatory Visit (HOSPITAL_COMMUNITY): Payer: BLUE CROSS/BLUE SHIELD | Attending: Cardiology

## 2016-11-12 ENCOUNTER — Other Ambulatory Visit: Payer: Self-pay

## 2016-11-12 DIAGNOSIS — R0609 Other forms of dyspnea: Secondary | ICD-10-CM | POA: Diagnosis not present

## 2016-11-12 DIAGNOSIS — J3081 Allergic rhinitis due to animal (cat) (dog) hair and dander: Secondary | ICD-10-CM | POA: Diagnosis not present

## 2016-11-12 DIAGNOSIS — J3089 Other allergic rhinitis: Secondary | ICD-10-CM | POA: Diagnosis not present

## 2016-11-12 DIAGNOSIS — J301 Allergic rhinitis due to pollen: Secondary | ICD-10-CM | POA: Diagnosis not present

## 2016-11-19 DIAGNOSIS — J3089 Other allergic rhinitis: Secondary | ICD-10-CM | POA: Diagnosis not present

## 2016-11-19 DIAGNOSIS — J301 Allergic rhinitis due to pollen: Secondary | ICD-10-CM | POA: Diagnosis not present

## 2016-11-19 DIAGNOSIS — J3081 Allergic rhinitis due to animal (cat) (dog) hair and dander: Secondary | ICD-10-CM | POA: Diagnosis not present

## 2016-11-26 DIAGNOSIS — J3089 Other allergic rhinitis: Secondary | ICD-10-CM | POA: Diagnosis not present

## 2016-11-26 DIAGNOSIS — J301 Allergic rhinitis due to pollen: Secondary | ICD-10-CM | POA: Diagnosis not present

## 2016-11-26 DIAGNOSIS — J3081 Allergic rhinitis due to animal (cat) (dog) hair and dander: Secondary | ICD-10-CM | POA: Diagnosis not present

## 2016-12-03 DIAGNOSIS — J3081 Allergic rhinitis due to animal (cat) (dog) hair and dander: Secondary | ICD-10-CM | POA: Diagnosis not present

## 2016-12-03 DIAGNOSIS — J301 Allergic rhinitis due to pollen: Secondary | ICD-10-CM | POA: Diagnosis not present

## 2016-12-03 DIAGNOSIS — J3089 Other allergic rhinitis: Secondary | ICD-10-CM | POA: Diagnosis not present

## 2016-12-10 DIAGNOSIS — J3089 Other allergic rhinitis: Secondary | ICD-10-CM | POA: Diagnosis not present

## 2016-12-10 DIAGNOSIS — J3081 Allergic rhinitis due to animal (cat) (dog) hair and dander: Secondary | ICD-10-CM | POA: Diagnosis not present

## 2016-12-10 DIAGNOSIS — J301 Allergic rhinitis due to pollen: Secondary | ICD-10-CM | POA: Diagnosis not present

## 2016-12-17 DIAGNOSIS — J3081 Allergic rhinitis due to animal (cat) (dog) hair and dander: Secondary | ICD-10-CM | POA: Diagnosis not present

## 2016-12-17 DIAGNOSIS — J301 Allergic rhinitis due to pollen: Secondary | ICD-10-CM | POA: Diagnosis not present

## 2016-12-17 DIAGNOSIS — J3089 Other allergic rhinitis: Secondary | ICD-10-CM | POA: Diagnosis not present

## 2016-12-25 DIAGNOSIS — J301 Allergic rhinitis due to pollen: Secondary | ICD-10-CM | POA: Diagnosis not present

## 2016-12-25 DIAGNOSIS — J3089 Other allergic rhinitis: Secondary | ICD-10-CM | POA: Diagnosis not present

## 2016-12-25 DIAGNOSIS — J3081 Allergic rhinitis due to animal (cat) (dog) hair and dander: Secondary | ICD-10-CM | POA: Diagnosis not present

## 2017-01-01 DIAGNOSIS — J3089 Other allergic rhinitis: Secondary | ICD-10-CM | POA: Diagnosis not present

## 2017-01-01 DIAGNOSIS — J3081 Allergic rhinitis due to animal (cat) (dog) hair and dander: Secondary | ICD-10-CM | POA: Diagnosis not present

## 2017-01-01 DIAGNOSIS — J301 Allergic rhinitis due to pollen: Secondary | ICD-10-CM | POA: Diagnosis not present

## 2017-01-08 DIAGNOSIS — J301 Allergic rhinitis due to pollen: Secondary | ICD-10-CM | POA: Diagnosis not present

## 2017-01-08 DIAGNOSIS — J3089 Other allergic rhinitis: Secondary | ICD-10-CM | POA: Diagnosis not present

## 2017-01-08 DIAGNOSIS — J3081 Allergic rhinitis due to animal (cat) (dog) hair and dander: Secondary | ICD-10-CM | POA: Diagnosis not present

## 2017-01-15 DIAGNOSIS — J301 Allergic rhinitis due to pollen: Secondary | ICD-10-CM | POA: Diagnosis not present

## 2017-01-15 DIAGNOSIS — J3089 Other allergic rhinitis: Secondary | ICD-10-CM | POA: Diagnosis not present

## 2017-01-15 DIAGNOSIS — J3081 Allergic rhinitis due to animal (cat) (dog) hair and dander: Secondary | ICD-10-CM | POA: Diagnosis not present

## 2017-01-22 DIAGNOSIS — J3089 Other allergic rhinitis: Secondary | ICD-10-CM | POA: Diagnosis not present

## 2017-01-22 DIAGNOSIS — J301 Allergic rhinitis due to pollen: Secondary | ICD-10-CM | POA: Diagnosis not present

## 2017-01-22 DIAGNOSIS — J3081 Allergic rhinitis due to animal (cat) (dog) hair and dander: Secondary | ICD-10-CM | POA: Diagnosis not present

## 2017-01-29 DIAGNOSIS — J3081 Allergic rhinitis due to animal (cat) (dog) hair and dander: Secondary | ICD-10-CM | POA: Diagnosis not present

## 2017-01-29 DIAGNOSIS — J301 Allergic rhinitis due to pollen: Secondary | ICD-10-CM | POA: Diagnosis not present

## 2017-01-29 DIAGNOSIS — J3089 Other allergic rhinitis: Secondary | ICD-10-CM | POA: Diagnosis not present

## 2017-02-01 DIAGNOSIS — G43109 Migraine with aura, not intractable, without status migrainosus: Secondary | ICD-10-CM | POA: Diagnosis not present

## 2017-02-01 DIAGNOSIS — G43709 Chronic migraine without aura, not intractable, without status migrainosus: Secondary | ICD-10-CM | POA: Diagnosis not present

## 2017-02-05 DIAGNOSIS — J3081 Allergic rhinitis due to animal (cat) (dog) hair and dander: Secondary | ICD-10-CM | POA: Diagnosis not present

## 2017-02-05 DIAGNOSIS — J301 Allergic rhinitis due to pollen: Secondary | ICD-10-CM | POA: Diagnosis not present

## 2017-02-05 DIAGNOSIS — J3089 Other allergic rhinitis: Secondary | ICD-10-CM | POA: Diagnosis not present

## 2017-02-08 DIAGNOSIS — J3089 Other allergic rhinitis: Secondary | ICD-10-CM | POA: Diagnosis not present

## 2017-02-12 DIAGNOSIS — J3089 Other allergic rhinitis: Secondary | ICD-10-CM | POA: Diagnosis not present

## 2017-02-12 DIAGNOSIS — J3081 Allergic rhinitis due to animal (cat) (dog) hair and dander: Secondary | ICD-10-CM | POA: Diagnosis not present

## 2017-02-12 DIAGNOSIS — J301 Allergic rhinitis due to pollen: Secondary | ICD-10-CM | POA: Diagnosis not present

## 2017-02-14 ENCOUNTER — Encounter: Payer: Self-pay | Admitting: Family Medicine

## 2017-02-19 DIAGNOSIS — J3081 Allergic rhinitis due to animal (cat) (dog) hair and dander: Secondary | ICD-10-CM | POA: Diagnosis not present

## 2017-02-19 DIAGNOSIS — J3089 Other allergic rhinitis: Secondary | ICD-10-CM | POA: Diagnosis not present

## 2017-02-19 DIAGNOSIS — J301 Allergic rhinitis due to pollen: Secondary | ICD-10-CM | POA: Diagnosis not present

## 2017-02-26 DIAGNOSIS — J3081 Allergic rhinitis due to animal (cat) (dog) hair and dander: Secondary | ICD-10-CM | POA: Diagnosis not present

## 2017-02-26 DIAGNOSIS — J301 Allergic rhinitis due to pollen: Secondary | ICD-10-CM | POA: Diagnosis not present

## 2017-02-26 DIAGNOSIS — J3089 Other allergic rhinitis: Secondary | ICD-10-CM | POA: Diagnosis not present

## 2017-03-05 DIAGNOSIS — J301 Allergic rhinitis due to pollen: Secondary | ICD-10-CM | POA: Diagnosis not present

## 2017-03-05 DIAGNOSIS — J3081 Allergic rhinitis due to animal (cat) (dog) hair and dander: Secondary | ICD-10-CM | POA: Diagnosis not present

## 2017-03-05 DIAGNOSIS — J3089 Other allergic rhinitis: Secondary | ICD-10-CM | POA: Diagnosis not present

## 2017-03-07 DIAGNOSIS — J301 Allergic rhinitis due to pollen: Secondary | ICD-10-CM | POA: Diagnosis not present

## 2017-03-07 DIAGNOSIS — J3081 Allergic rhinitis due to animal (cat) (dog) hair and dander: Secondary | ICD-10-CM | POA: Diagnosis not present

## 2017-03-12 DIAGNOSIS — J3089 Other allergic rhinitis: Secondary | ICD-10-CM | POA: Diagnosis not present

## 2017-03-12 DIAGNOSIS — J3081 Allergic rhinitis due to animal (cat) (dog) hair and dander: Secondary | ICD-10-CM | POA: Diagnosis not present

## 2017-03-12 DIAGNOSIS — J301 Allergic rhinitis due to pollen: Secondary | ICD-10-CM | POA: Diagnosis not present

## 2017-03-19 DIAGNOSIS — J301 Allergic rhinitis due to pollen: Secondary | ICD-10-CM | POA: Diagnosis not present

## 2017-03-19 DIAGNOSIS — J3081 Allergic rhinitis due to animal (cat) (dog) hair and dander: Secondary | ICD-10-CM | POA: Diagnosis not present

## 2017-03-19 DIAGNOSIS — J3089 Other allergic rhinitis: Secondary | ICD-10-CM | POA: Diagnosis not present

## 2017-03-21 ENCOUNTER — Encounter: Payer: Self-pay | Admitting: Podiatry

## 2017-03-21 ENCOUNTER — Ambulatory Visit: Payer: BLUE CROSS/BLUE SHIELD

## 2017-03-21 ENCOUNTER — Ambulatory Visit (INDEPENDENT_AMBULATORY_CARE_PROVIDER_SITE_OTHER): Payer: BLUE CROSS/BLUE SHIELD | Admitting: Podiatry

## 2017-03-21 VITALS — BP 106/57 | HR 72 | Resp 16

## 2017-03-21 DIAGNOSIS — M7662 Achilles tendinitis, left leg: Secondary | ICD-10-CM | POA: Diagnosis not present

## 2017-03-21 DIAGNOSIS — M7661 Achilles tendinitis, right leg: Secondary | ICD-10-CM

## 2017-03-21 MED ORDER — MELOXICAM 15 MG PO TABS: 15.0000 mg | ORAL_TABLET | Freq: Every day | ORAL | 3 refills | Status: DC

## 2017-03-21 MED ORDER — METHYLPREDNISOLONE 4 MG PO TBPK: ORAL_TABLET | ORAL | 0 refills | Status: DC

## 2017-03-21 NOTE — Patient Instructions (Signed)

## 2017-03-21 NOTE — Progress Notes (Signed)
Subjective:  Patient ID: Vicki [name], female    DOB: 04-23-1961,  MRN: [id number] HPI Chief Complaint  Patient presents with  . Foot Pain    Posterior/medial heel bilateral (R>L) - aching and sometimes burning for few months, no injury, red and swollen area on right    56 y.o. female presents with the above complaint.     Past Medical History:  Diagnosis Date  . Allergy   . Anxiety    hx ?panic disorder  . Asthma   . Celiac disease   . Chronic gastritis   . GERD (gastroesophageal reflux disease)   . Hyperplastic colon polyp   . Migraines    chronic  . Schatzki's ring   . Skin cancer    chin area  . Thyroid disease    Past Surgical History:  Procedure Laterality Date  . ABLATION  2012  . CESAREAN SECTION  1984  . COLONOSCOPY  2011  . DILATION AND CURETTAGE OF UTERUS  2012  . POLYPECTOMY    . TONSILLECTOMY AND ADENOIDECTOMY  1964  . TUBAL LIGATION  1998  . UPPER GASTROINTESTINAL ENDOSCOPY  4098,1191    Current Outpatient Prescriptions:  .  baclofen (LIORESAL) 10 MG tablet, Take 10 mg by mouth every 8 (eight) hours as needed (for migraines)., Disp: , Rfl:  .  mometasone (NASONEX) 50 MCG/ACT nasal spray, USE 2 SPRAYS NASALLY EVERY DAY, Disp: 17 g, Rfl: 5 .  Multiple Vitamins-Minerals (WOMENS MULTIVITAMIN PLUS PO), Take by mouth daily., Disp: , Rfl:  .  PARoxetine (PAXIL) 10 MG tablet, Take 5 mg by mouth at bedtime., Disp: , Rfl:   Current Facility-Administered Medications:  .  0.9 %  sodium chloride infusion, 500 mL, Intravenous, Continuous, Pyrtle, Lajuan Lines, MD  Allergies  Allergen Reactions  . Lamisil [Terbinafine Hcl] Hives  . Ceclor [Cefaclor]     hives  . Cephalosporins   . Latex   . Zoster Vaccine Live Rash   Review of Systems  All other systems reviewed and are negative.  Objective:   Vitals:   03/21/17 1359  BP: (!) 106/57  Pulse: 72  Resp: 16    General: Well developed, nourished, in no acute distress, alert and oriented x3    Dermatological: Skin is warm, dry and supple bilateral. Nails x 10 are well maintained; remaining integument appears unremarkable at this time. There are no open sores, no preulcerative lesions, no rash or signs of infection present.  Vascular: Dorsalis Pedis artery and Posterior Tibial artery pedal pulses are 2/4 bilateral with immedate capillary fill time. Pedal hair growth present. No varicosities and no lower extremity edema present bilateral.   Neruologic: Grossly intact via light touch bilateral. Vibratory intact via tuning fork bilateral. Protective threshold with Semmes Wienstein monofilament intact to all pedal sites bilateral. Patellar and Achilles deep tendon reflexes 2+ bilateral. No Babinski or clonus noted bilateral.   Musculoskeletal: No gross boney pedal deformities bilateral. No pain, crepitus, or limitation noted with foot and ankle range of motion bilateral. Muscular strength 5/5 in all groups tested bilateral. She has pain on palpation of the Achilles tendon distal lateral/medial with overlying mild erythema. There is warmth on palpation. Right appears to be worse than the left.  Gait: Unassisted, Nonantalgic.    Radiographs:  Radiographs taken reviewed today demonstrated thickening of the Achilles tendon at its insertion site. Very mild spurring no fractures are identified.  Assessment & Plan:   Assessment: Achilles tendinitis insertional bilateral.  Plan: Injected the areas today  with dexamethasone and local anesthetic place her in a night splint. Discussed appropriate shoe gear stretching sizes and ice therapy. Placed her in a Medrol Dosepak as well as a prescription for meloxicam. Follow-up with me in 1 month      T. [place], [place]

## 2017-04-02 DIAGNOSIS — J3089 Other allergic rhinitis: Secondary | ICD-10-CM | POA: Diagnosis not present

## 2017-04-02 DIAGNOSIS — J3081 Allergic rhinitis due to animal (cat) (dog) hair and dander: Secondary | ICD-10-CM | POA: Diagnosis not present

## 2017-04-02 DIAGNOSIS — J301 Allergic rhinitis due to pollen: Secondary | ICD-10-CM | POA: Diagnosis not present

## 2017-04-08 ENCOUNTER — Other Ambulatory Visit: Payer: Self-pay | Admitting: Family Medicine

## 2017-04-08 NOTE — Telephone Encounter (Signed)
Refill for 6 months. 

## 2017-04-08 NOTE — Telephone Encounter (Signed)
Sent to PCP for approval.  

## 2017-04-09 DIAGNOSIS — J3081 Allergic rhinitis due to animal (cat) (dog) hair and dander: Secondary | ICD-10-CM | POA: Diagnosis not present

## 2017-04-09 DIAGNOSIS — J3089 Other allergic rhinitis: Secondary | ICD-10-CM | POA: Diagnosis not present

## 2017-04-09 DIAGNOSIS — J301 Allergic rhinitis due to pollen: Secondary | ICD-10-CM | POA: Diagnosis not present

## 2017-04-16 DIAGNOSIS — J3081 Allergic rhinitis due to animal (cat) (dog) hair and dander: Secondary | ICD-10-CM | POA: Diagnosis not present

## 2017-04-16 DIAGNOSIS — J301 Allergic rhinitis due to pollen: Secondary | ICD-10-CM | POA: Diagnosis not present

## 2017-04-16 DIAGNOSIS — J3089 Other allergic rhinitis: Secondary | ICD-10-CM | POA: Diagnosis not present

## 2017-04-17 DIAGNOSIS — Z1231 Encounter for screening mammogram for malignant neoplasm of breast: Secondary | ICD-10-CM | POA: Diagnosis not present

## 2017-04-17 DIAGNOSIS — Z803 Family history of malignant neoplasm of breast: Secondary | ICD-10-CM | POA: Diagnosis not present

## 2017-04-17 LAB — HM MAMMOGRAPHY

## 2017-04-23 ENCOUNTER — Encounter: Payer: Self-pay | Admitting: Family Medicine

## 2017-04-23 DIAGNOSIS — J3081 Allergic rhinitis due to animal (cat) (dog) hair and dander: Secondary | ICD-10-CM | POA: Diagnosis not present

## 2017-04-23 DIAGNOSIS — J3089 Other allergic rhinitis: Secondary | ICD-10-CM | POA: Diagnosis not present

## 2017-04-23 DIAGNOSIS — J301 Allergic rhinitis due to pollen: Secondary | ICD-10-CM | POA: Diagnosis not present

## 2017-04-25 ENCOUNTER — Ambulatory Visit: Payer: BLUE CROSS/BLUE SHIELD | Admitting: Podiatry

## 2017-04-30 DIAGNOSIS — J3081 Allergic rhinitis due to animal (cat) (dog) hair and dander: Secondary | ICD-10-CM | POA: Diagnosis not present

## 2017-04-30 DIAGNOSIS — J3089 Other allergic rhinitis: Secondary | ICD-10-CM | POA: Diagnosis not present

## 2017-04-30 DIAGNOSIS — J301 Allergic rhinitis due to pollen: Secondary | ICD-10-CM | POA: Diagnosis not present

## 2017-05-07 DIAGNOSIS — J301 Allergic rhinitis due to pollen: Secondary | ICD-10-CM | POA: Diagnosis not present

## 2017-05-07 DIAGNOSIS — J3089 Other allergic rhinitis: Secondary | ICD-10-CM | POA: Diagnosis not present

## 2017-05-07 DIAGNOSIS — J3081 Allergic rhinitis due to animal (cat) (dog) hair and dander: Secondary | ICD-10-CM | POA: Diagnosis not present

## 2017-05-13 ENCOUNTER — Ambulatory Visit: Payer: BLUE CROSS/BLUE SHIELD | Admitting: Adult Health

## 2017-05-13 ENCOUNTER — Other Ambulatory Visit: Payer: Self-pay | Admitting: Family Medicine

## 2017-05-13 ENCOUNTER — Encounter: Payer: Self-pay | Admitting: Adult Health

## 2017-05-13 VITALS — BP 104/54 | Temp 98.3°F | Wt 128.0 lb

## 2017-05-13 DIAGNOSIS — R3915 Urgency of urination: Secondary | ICD-10-CM | POA: Diagnosis not present

## 2017-05-13 LAB — POC URINALSYSI DIPSTICK (AUTOMATED)
Bilirubin, UA: NEGATIVE
Glucose, UA: NEGATIVE
KETONES UA: NEGATIVE
LEUKOCYTES UA: NEGATIVE
Nitrite, UA: NEGATIVE
PH UA: 6.5 (ref 5.0–8.0)
PROTEIN UA: NEGATIVE
RBC UA: NEGATIVE
SPEC GRAV UA: 1.01 (ref 1.010–1.025)
Urobilinogen, UA: 0.2 E.U./dL

## 2017-05-13 MED ORDER — SUMATRIPTAN SUCCINATE 100 MG PO TABS: ORAL_TABLET | ORAL | 0 refills | Status: DC

## 2017-05-13 NOTE — Progress Notes (Signed)
Subjective:    Patient ID: Vicki [name], female    DOB: Apr 06, 1961, 56 y.o.   MRN: [id number]  HPI  56 year old female who  has a past medical history of Allergy, Anxiety, Asthma, Celiac disease, Chronic gastritis, GERD (gastroesophageal reflux disease), Hyperplastic colon polyp, Migraines, Schatzki's ring, Skin cancer, and Thyroid disease. She is a patient of Dr. Elease Hashimoto, who I am seeing today for an acute issue of possible UTI. She reports that her symptoms include that of urgency and " dribbling" . Her symptoms presented yesterday.She denies any back pain, stomach pain, fevers, hematuria, or dysuria.    She also reports that prior to this she had a migraine and took two imitrex and baclofen.   She does not drink coffee but does drink a lot of tea throughout the day   Review of Systems See HPI   Past Medical History:  Diagnosis Date  . Allergy   . Anxiety    hx ?panic disorder  . Asthma   . Celiac disease   . Chronic gastritis   . GERD (gastroesophageal reflux disease)   . Hyperplastic colon polyp   . Migraines    chronic  . Schatzki's ring   . Skin cancer    chin area  . Thyroid disease     Social History   Socioeconomic History  . Marital status: Married    Spouse name: Carlis Abbott  . Number of children: 4  . Years of education: Not on file  . Highest education level: Not on file  Social Needs  . Financial resource strain: Not on file  . Food insecurity - worry: Not on file  . Food insecurity - inability: Not on file  . Transportation needs - medical: Not on file  . Transportation needs - non-medical: Not on file  Occupational History  . Occupation: retired Therapist, sports  Tobacco Use  . Smoking status: Never Smoker  . Smokeless tobacco: Never Used  Substance and Sexual Activity  . Alcohol use: No  . Drug use: No  . Sexual activity: Not on file  Other Topics Concern  . Not on file  Social History Narrative  . Not on file    Past Surgical History:  Procedure  Laterality Date  . ABLATION  2012  . CESAREAN SECTION  1984  . COLONOSCOPY  2011  . DILATION AND CURETTAGE OF UTERUS  2012  . POLYPECTOMY    . TONSILLECTOMY AND ADENOIDECTOMY  1964  . TUBAL LIGATION  1998  . UPPER GASTROINTESTINAL ENDOSCOPY  2011,1998    Family History  Problem Relation Age of Onset  . Alcohol abuse Mother   . Cancer Mother        breast  . Alcohol abuse Father   . Stroke Father   . Asthma Paternal Aunt   . Colon cancer Paternal Aunt   . Arthritis Maternal Grandmother   . Cancer Sister 59       breast cancer  . Lupus Sister   . Irritable bowel syndrome Sister   . Colon cancer Paternal Grandfather   . Heart disease Neg Hx   . Esophageal cancer Neg Hx   . Stomach cancer Neg Hx   . Rectal cancer Neg Hx     Allergies  Allergen Reactions  . Lamisil [Terbinafine] Hives  . Ceclor [Cefaclor]     hives  . Cephalosporins   . Latex   . Zoster Vaccine Live Rash    Current Outpatient Medications on File Prior to  Visit  Medication Sig Dispense Refill  . baclofen (LIORESAL) 10 MG tablet Take 10 mg by mouth every 8 (eight) hours as needed (for migraines).    . mometasone (NASONEX) 50 MCG/ACT nasal spray USE 2 SPRAYS NASALLY EVERY DAY 17 g 5  . Multiple Vitamins-Minerals (WOMENS MULTIVITAMIN PLUS PO) Take by mouth daily.    Marland Kitchen PAXIL 10 MG tablet TAKE 1 TABLET BY MOUTH EVERY MORNING 90 tablet 1  . meloxicam (MOBIC) 15 MG tablet Take 1 tablet (15 mg total) by mouth daily. (Patient not taking: Reported on 05/13/2017) 30 tablet 3   Current Facility-Administered Medications on File Prior to Visit  Medication Dose Route Frequency Provider Last Rate Last Dose  . 0.9 %  sodium chloride infusion  500 mL Intravenous Continuous Pyrtle, Lajuan Lines, MD        BP (!) 104/54 (BP Location: Left Arm)   Temp 98.3 F (36.8 C) (Oral)   Wt 128 lb (58.1 kg)   BMI 21.30 kg/m       Objective:   Physical Exam  Constitutional: She is oriented to person, place, and time. She appears  well-developed and well-nourished. No distress.  Cardiovascular: Normal rate, regular rhythm, normal heart sounds and intact distal pulses. Exam reveals no gallop and no friction rub.  No murmur heard. Pulmonary/Chest: Effort normal and breath sounds normal. No respiratory distress. She has no wheezes. She has no rales. She exhibits no tenderness.  Abdominal: There is no hepatosplenomegaly, splenomegaly or hepatomegaly. There is no CVA tenderness.  Neurological: She is alert and oriented to person, place, and time.  Skin: Skin is warm and dry. No rash noted. She is not diaphoretic. No erythema. No pallor.  Psychiatric: She has a normal mood and affect. Her behavior is normal. Judgment and thought content normal.  Nursing note and vitals reviewed.     Assessment & Plan:  1. Urinary urgency - POCT Urinalysis Dipstick (Automated)- negative  - Advised to stop baclofen as this can cause her symptoms  - Also advised to cut back on tea for the next 2-3 days  - Follow up if symptoms do not resolve   Dorothyann Peng, NP

## 2017-05-14 DIAGNOSIS — J301 Allergic rhinitis due to pollen: Secondary | ICD-10-CM | POA: Diagnosis not present

## 2017-05-14 DIAGNOSIS — J3089 Other allergic rhinitis: Secondary | ICD-10-CM | POA: Diagnosis not present

## 2017-05-14 DIAGNOSIS — J3081 Allergic rhinitis due to animal (cat) (dog) hair and dander: Secondary | ICD-10-CM | POA: Diagnosis not present

## 2017-05-16 ENCOUNTER — Other Ambulatory Visit: Payer: Self-pay | Admitting: Family Medicine

## 2017-05-20 ENCOUNTER — Ambulatory Visit: Payer: BLUE CROSS/BLUE SHIELD | Admitting: Family Medicine

## 2017-05-24 DIAGNOSIS — J301 Allergic rhinitis due to pollen: Secondary | ICD-10-CM | POA: Diagnosis not present

## 2017-05-24 DIAGNOSIS — J3089 Other allergic rhinitis: Secondary | ICD-10-CM | POA: Diagnosis not present

## 2017-05-24 DIAGNOSIS — J3081 Allergic rhinitis due to animal (cat) (dog) hair and dander: Secondary | ICD-10-CM | POA: Diagnosis not present

## 2017-07-15 ENCOUNTER — Telehealth: Payer: Self-pay | Admitting: Internal Medicine

## 2017-07-15 MED ORDER — FLUCONAZOLE 100 MG PO TABS: 100.0000 mg | ORAL_TABLET | Freq: Every day | ORAL | 0 refills | Status: DC

## 2017-07-15 NOTE — Telephone Encounter (Signed)
Ok for fluconazole 100 mg daily x 10 days Call if not improving or returning to baseline/normal

## 2017-07-15 NOTE — Telephone Encounter (Signed)
Patient states she would like to speak with Dr.Pyrtle's nurse about gi symptoms she has been having for a few weeks. Pt wanting to see about getting a medication or seeing the PA [place].

## 2017-07-15 NOTE — Telephone Encounter (Signed)
Spoke with pt and she is aware, script sent to pharmacy.

## 2017-07-15 NOTE — Telephone Encounter (Signed)
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 Reports she has been having terrible bloating and gas and "soft serve stool." Husband told her the gas she  Has smells terrible. She states it smells like something has "fermented in her gi tract." She has a red rash that is around her rectum and it looks like a yeast infection. Reports she had a diflucan pill left over from a vaginal yeast infection, she took this and states it has helped. She is also using some topical cream that she used for a yeast infection. Pt states she felt better Saturday when she took the diflucan pill but started having the symptoms Sunday. Pt thinks she may have yeast throughout her GI tract and feels she may need Diflucan for several weeks. Please advise.

## 2017-08-30 ENCOUNTER — Other Ambulatory Visit: Payer: Self-pay | Admitting: Internal Medicine

## 2017-11-14 ENCOUNTER — Other Ambulatory Visit: Payer: Self-pay | Admitting: Internal Medicine

## 2017-11-14 NOTE — Telephone Encounter (Signed)
Dr Hilarie Fredrickson, patient wants refills on xifaxan. It appears she has already been treated with this multiple times... Please advise.

## 2017-11-14 NOTE — Telephone Encounter (Signed)
[facility] for refill TID x 14 days

## 2017-11-15 MED ORDER — RIFAXIMIN 550 MG PO TABS: 550.0000 mg | ORAL_TABLET | Freq: Three times a day (TID) | ORAL | 0 refills | Status: DC

## 2017-11-15 NOTE — Telephone Encounter (Signed)
Rx sent to [facility]. Left a v/m to inform the patient.

## 2017-11-21 ENCOUNTER — Telehealth: Payer: Self-pay | Admitting: Internal Medicine

## 2017-11-21 NOTE — Telephone Encounter (Signed)
Ok to try 7 days of: Metronidazole 500 mg three times per day; plus Trimethoprim-sulfamethoxazole 1 double-strength tablet twice per day

## 2017-11-21 NOTE — Telephone Encounter (Signed)
Dr Archer Asa advise.Marland KitchenMarland Kitchen

## 2017-11-22 MED ORDER — SULFAMETHOXAZOLE-TRIMETHOPRIM 800-160 MG PO TABS: 1.0000 | ORAL_TABLET | Freq: Two times a day (BID) | ORAL | 0 refills | Status: DC

## 2017-11-22 MED ORDER — METRONIDAZOLE 500 MG PO TABS: 500.0000 mg | ORAL_TABLET | Freq: Three times a day (TID) | ORAL | 0 refills | Status: DC

## 2017-11-22 NOTE — Telephone Encounter (Signed)
Rx has been sent for Flagyl and Bactrim. Patient is advised and verbalizes understanding.

## 2018-01-12 ENCOUNTER — Other Ambulatory Visit: Payer: Self-pay | Admitting: Family Medicine

## 2018-01-14 ENCOUNTER — Ambulatory Visit: Payer: BLUE CROSS/BLUE SHIELD | Admitting: Internal Medicine

## 2018-01-14 ENCOUNTER — Other Ambulatory Visit (INDEPENDENT_AMBULATORY_CARE_PROVIDER_SITE_OTHER): Payer: Self-pay

## 2018-01-14 ENCOUNTER — Encounter: Payer: Self-pay | Admitting: Internal Medicine

## 2018-01-14 VITALS — BP 110/58 | HR 60 | Ht 65.0 in | Wt 119.0 lb

## 2018-01-14 DIAGNOSIS — R634 Abnormal weight loss: Secondary | ICD-10-CM

## 2018-01-14 DIAGNOSIS — K589 Irritable bowel syndrome without diarrhea: Secondary | ICD-10-CM | POA: Insufficient documentation

## 2018-01-14 DIAGNOSIS — R195 Other fecal abnormalities: Secondary | ICD-10-CM

## 2018-01-14 DIAGNOSIS — K900 Celiac disease: Secondary | ICD-10-CM

## 2018-01-14 DIAGNOSIS — R140 Abdominal distension (gaseous): Secondary | ICD-10-CM

## 2018-01-14 LAB — COMPREHENSIVE METABOLIC PANEL
ALT: 14 U/L (ref 0–35)
AST: 20 U/L (ref 0–37)
Albumin: 4.5 g/dL (ref 3.5–5.2)
Alkaline Phosphatase: 44 U/L (ref 39–117)
BILIRUBIN TOTAL: 0.6 mg/dL (ref 0.2–1.2)
BUN: 8 mg/dL (ref 6–23)
CALCIUM: 10 mg/dL (ref 8.4–10.5)
CHLORIDE: 104 meq/L (ref 96–112)
CO2: 32 mEq/L (ref 19–32)
CREATININE: 1 mg/dL (ref 0.40–1.20)
GFR: 60.42 mL/min (ref >60.00)
Glucose, Bld: 89 mg/dL (ref 70–99)
Potassium: 4.2 mEq/L (ref 3.5–5.1)
Sodium: 142 mEq/L (ref 135–145)
Total Protein: 7.1 g/dL (ref 6.0–8.3)

## 2018-01-14 LAB — IBC PANEL
IRON: 110 ug/dL (ref 42–145)
SATURATION RATIOS: 37.1 % (ref 20.0–50.0)
TRANSFERRIN: 212 mg/dL (ref 212.0–360.0)

## 2018-01-14 LAB — CBC WITH DIFFERENTIAL/PLATELET
BASOS PCT: 2.5 % (ref 0.0–3.0)
Basophils Absolute: 0.1 10*3/uL (ref 0.0–0.1)
EOS ABS: 0.4 10*3/uL (ref 0.0–0.7)
Eosinophils Relative: 10 % — ABNORMAL HIGH (ref 0.0–5.0)
HEMATOCRIT: 36.5 % (ref 36.0–46.0)
Hemoglobin: 12.2 g/dL (ref 12.0–15.0)
LYMPHS PCT: 36 % (ref 12.0–46.0)
Lymphs Abs: 1.4 10*3/uL (ref 0.7–4.0)
MCHC: 33.4 g/dL (ref 30.0–36.0)
MCV: 92.1 fl (ref 78.0–100.0)
MONOS PCT: 6.2 % (ref 3.0–12.0)
Monocytes Absolute: 0.2 10*3/uL (ref 0.1–1.0)
Neutro Abs: 1.8 10*3/uL (ref 1.4–7.7)
Neutrophils Relative %: 45.3 % (ref 43.0–77.0)
Platelets: 196 10*3/uL (ref 150.0–400.0)
RBC: 3.96 Mil/uL (ref 3.87–5.11)
RDW: 12.9 % (ref 11.5–15.5)
WBC: 4 10*3/uL (ref 4.0–10.5)

## 2018-01-14 LAB — TSH: TSH: 2.51 u[IU]/mL (ref 0.35–4.50)

## 2018-01-14 LAB — FERRITIN: Ferritin: 70.7 ng/mL (ref 10.0–291.0)

## 2018-01-14 MED ORDER — DIPHENOXYLATE-ATROPINE 2.5-0.025 MG PO TABS: 1.0000 | ORAL_TABLET | Freq: Four times a day (QID) | ORAL | 1 refills | Status: DC | PRN

## 2018-01-14 MED ORDER — PANCRELIPASE (LIP-PROT-AMYL) 40000-126000 UNITS PO CPEP: 2.0000 | ORAL_CAPSULE | Freq: Three times a day (TID) | ORAL | 2 refills | Status: DC

## 2018-01-14 NOTE — Progress Notes (Signed)
Subjective:    Patient ID: Vicki [name], female    DOB: 10-07-1960, 57 y.o.   MRN: [id number]  HPI Vicki [name] is a 57 year old female with a past medical history of celiac disease, past issues with reflux, hypothyroidism and migraines who is here for follow-up.  She is here today with her husband.  She was last seen on 06/11/2016.  She has been struggling with abdominal bloating, gas, occasional loose stools with fecal smearing.  She also has occasional constipation.  Borborygmi is also an issue.  Some lower abdominal cramping.  Symptoms are not daily but often enough that they are impacting her life.  She has lost about 10 pounds in the last 8 months.  She is also been struggling with fatigue.  She is following a strict gluten-free diet and for the most part eats a dairy free diet.  She does eat very small amounts of butter.  She has avoided fried and spicy foods.  She states that she simply does not know what to eat anymore and this is quite stressful.  She is taking Paxil 10 mg daily, Imitrex as needed, mometasone nasal spray.  Baclofen as needed for migraine.  In the past I treated her with rifaximin initially after being seen in January 2018 for similar symptoms.  She states the first course seem to help her symptoms significantly.  Again another course of months later in 2018 also helped but symptoms returned over time.  We attempted to treat again recently but the medication was cost prohibitive as not covered by insurance.  I did treat her with fluconazole for a perianal yeast/fungal infection and this improved.  She tried for 5 different probiotics which if anything worsen symptoms.  Chinese herbs also worsen symptoms.  She is using occasional Imodium, 1 mg but this often makes her feel constipated but does help with the loose stools.  Her last procedures which I performed were done on 12/27/2015.  Upper endoscopy and colonoscopy. EGD revealed a normal esophagus, a widely patent Schatzki's  ring, normal stomach and normal duodenum. The stomach and duodenum were biopsied. There was mild chronic gastritis negative for Helicobacter pylori. The duodenum showed mild peptic duodenitis with no villous blunting or increase in intraepithelial lymphocytes. There was no dysplasia.  Colonoscopy revealed a 5 mm polyp in the descending colon removed with cold snare and otherwise normal exam. This polyp was hyperplastic without adenomatous change.   Review of Systems As per HPI, otherwise negative  Current Medications, Allergies, Past Medical History, Past Surgical History, Family History and Social History were reviewed in [employer] record.     Objective:   Physical Exam BP (!) 110/58   Pulse 60   Ht 5' 5"  (1.651 m)   Wt 119 lb (54 kg)   BMI 19.80 kg/m  Constitutional: Well-developed and well-nourished. No distress. HEENT: Normocephalic and atraumatic.   No scleral icterus. Neck: Neck supple. Trachea midline. Cardiovascular: Normal rate, regular rhythm and intact distal pulses. No M/R/G Pulmonary/chest: Effort normal and breath sounds normal. No wheezing, rales or rhonchi. Abdominal: Soft, nontender, nondistended. Bowel sounds active throughout. There are no masses palpable. No hepatosplenomegaly. Extremities: no clubbing, cyanosis, or edema Neurological: Alert and oriented to person place and time. Skin: Skin is warm and dry. Psychiatric: Normal mood and affect. Behavior is normal.      Assessment & Plan:  57 year old female with a past medical history of celiac disease, past issues with reflux, hypothyroidism and migraines who is  here for follow-up.  1.  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  Some of her symptoms are very much irritable bowel type but the weight loss concerns me.  Weight loss  would not be expected with IBS alone.  Her previous favorable response to rifaximin argues for IBS but also would have treated SIBO.  I think it is possible that she may have maldigestion and could have pancreatic insufficiency as there is some association with celiac disease.  I am going to check TTG, CBC, CMP, iron studies and TSH today.  Trial of Zenpep 40,000 units, 2 capsules with meals one with snacks.  If no benefit would consider CT enterography.  We discussed possible capsule but CT would likely provide more information/diagnostic yield.  We also could consider repeating endoscopy versus enteroscopy with small bowel biopsy.  I asked her to let me know her response to Zenpep.  Also provided prescription for Lomotil to be used up to 4 times a day as needed for loose stools.  25 minutes spent with the patient today. Greater than 50% was spent in counseling and coordination of care with the patient

## 2018-01-14 NOTE — Patient Instructions (Addendum)
Your provider has requested that you go to the basement level for lab work before leaving today. Press "B" on the elevator. The lab is located at the first door on the left as you exit the elevator.  We have sent the following medications to your pharmacy for you to pick up at your convenience: Zenpep 40,000 IU-Take 2 capsules with meals, 1 capsule with snacks Lomotil 4 times daily as needed for diarrhea  Please follow a gluten free, lactose free diet.  Please follow up with Dr Hilarie Fredrickson on 03/05/18 at 145 pm.  If you are age 58 or older, your body mass index should be between 23-30. Your Body mass index is 19.8 kg/m. If this is out of the aforementioned range listed, please consider follow up with your Primary Care Provider.  If you are age 42 or younger, your body mass index should be between 19-25. Your Body mass index is 19.8 kg/m. If this is out of the aformentioned range listed, please consider follow up with your Primary Care Provider.    Gluten-Free Diet for Celiac Disease, Adult The gluten-free diet includes all foods that do not contain gluten. Gluten is a protein that is found in wheat, rye, barley, and some other grains. Following the gluten-free diet is the only treatment for people with celiac disease. It helps to prevent damage to the intestines and improves or eliminates the symptoms of celiac disease. Following the gluten-free diet requires some planning. It can be challenging at first, but it gets easier with time and practice. There are more gluten-free options available today than ever before. If you need help finding gluten-free foods or if you have questions, talk with your diet and nutrition specialist (registered dietitian) or your health care provider. What do I need to know about a gluten-free diet?  All fruits, vegetables, and meats are safe to eat and do not contain gluten.  When grocery shopping, start by shopping in the produce, meat, and dairy sections. These  sections are more likely to contain gluten-free foods. Then move to the aisles that contain packaged foods if you need to.  Read all food labels. Gluten is often added to foods. Always check the ingredient list and look for warnings, such as "may contain gluten."  Talk with your dietitian or health care provider before taking a gluten-free multivitamin or mineral supplement.  Be aware of gluten-free foods having contact with foods that contain gluten (cross-contamination). This can happen at home and with any processed foods. ? Talk with your health care provider or dietitian about how to reduce the risk of cross-contamination in your home. ? If you have questions about how a food is processed, ask the manufacturer. What key words help to identify gluten? Foods that list any of these key words on the label usually contain gluten:  Wheat, flour, enriched flour, bromated flour, white flour, durum flour, graham flour, phosphated flour, self-rising flour, semolina, farina, barley (malt), rye, and oats.  Starch, dextrin, modified food starch, or cereal.  Thickening, fillers, or emulsifiers.  Malt flavoring, malt extract, or malt syrup.  Hydrolyzed vegetable protein.  In the [place] packaged foods that are gluten-free are required to be labeled "GF." These foods should be easy to identify and are safe to eat. In the [place] food companies are also required to list common food allergens, including wheat, on their labels. Recommended foods Grains  Amaranth, bean flours, 100% buckwheat flour, corn, millet, nut flours or nut meals, GF oats, quinoa, rice, sorghum, teff,  rice wafers, pure cornmeal tortillas, popcorn, and hot cereals made from cornmeal. Hominy, rice, wild rice. Some Asian rice noodles or bean noodles. Arrowroot starch, corn bran, corn flour, corn germ, cornmeal, corn starch, potato flour, potato starch flour, and rice bran. Plain, brown, and sweet rice flours. Rice polish, soy flour, and  tapioca starch. Vegetables  All plain fresh, frozen, and canned vegetables. Fruits  All plain fresh, frozen, canned, and dried fruits, and 100% fruit juices. Meats and other protein foods  All fresh beef, pork, poultry, fish, seafood, and eggs. Fish canned in water, oil, brine, or vegetable broth. Plain nuts and seeds, peanut butter. Some lunch meat and some frankfurters. Dried beans, dried peas, and lentils. Dairy  Fresh plain, dry, evaporated, or condensed milk. Cream, butter, sour cream, whipping cream, and most yogurts. Unprocessed cheese, most processed cheeses, some cottage cheese, some cream cheeses. Beverages  Coffee, tea, most herbal teas. Carbonated beverages and some root beers. Wine, sake, and distilled spirits, such as gin, vodka, and whiskey. Most hard ciders. Fats and oils  Butter, margarine, vegetable oil, hydrogenated butter, olive oil, shortening, lard, cream, and some mayonnaise. Some commercial salad dressings. Olives. Sweets and desserts  Sugar, honey, some syrups, molasses, jelly, and jam. Plain hard candy, marshmallows, and gumdrops. Pure cocoa powder. Plain chocolate. Custard and some pudding mixes. Gelatin desserts, sorbets, frozen ice pops, and sherbet. Cake, cookies, and other desserts prepared with allowed flours. Some commercial ice creams. Cornstarch, tapioca, and rice puddings. Seasoning and other foods  Some canned or frozen soups. Monosodium glutamate (MSG). Cider, rice, and wine vinegar. Baking soda and baking powder. Cream of tartar. Baking and nutritional yeast. Certain soy sauces made without wheat (ask your dietitian about specific brands that are allowed). Nuts, coconut, and chocolate. Salt, pepper, herbs, spices, flavoring extracts, imitation or artificial flavorings, natural flavorings, and food colorings. Some medicines and supplements. Some lip glosses and other cosmetics. Rice syrups. The items listed may not be a complete list. Talk with your  dietitian about what dietary choices are best for you. Foods to avoid Grains  Barley, bran, bulgur, couscous, cracked wheat, [place], farro, graham, malt, matzo, semolina, wheat germ, and all wheat and rye cereals including spelt and kamut. Cereals containing malt as a flavoring, such as rice cereal. Noodles, spaghetti, macaroni, most packaged rice mixes, and all mixes containing wheat, rye, barley, or triticale. Vegetables  Most creamed vegetables and most vegetables canned in sauces. Some commercially prepared vegetables and salads. Fruits  Thickened or prepared fruits and some pie fillings. Some fruit snacks and fruit roll-ups. Meats and other protein foods  Any meat or meat alternative containing wheat, rye, barley, or gluten stabilizers. These are often marinated or packaged meats and lunch meats. Bread-containing products, such as Swiss steak, croquettes, meatballs, and meatloaf. Most tuna canned in vegetable broth and [place] with hydrolyzed vegetable protein (HVP) injected as part of the basting. [place]. Imitation fish. Eggs in sauces made from ingredients to avoid. Dairy  Commercial chocolate milk drinks and malted milk. Some non-dairy creamers. Any cheese product containing ingredients to avoid. Beverages  Certain cereal beverages. Beer, ale, malted milk, and some root beers. Some hard ciders. Some instant flavored coffees. Some herbal teas made with barley or with barley malt added. Fats and oils  Some commercial salad dressings. Sour cream containing modified food starch. Sweets and desserts  Some toffees. Chocolate-coated nuts (may be rolled in wheat flour) and some commercial candies and candy bars. Most cakes, cookies, donuts, pastries, and other  baked goods. Some commercial ice cream. Ice cream cones. Commercially prepared mixes for cakes, cookies, and other desserts. Bread pudding and other puddings thickened with flour. Products containing brown rice syrup made with barley  malt enzyme. Desserts and sweets made with malt flavoring. Seasoning and other foods  Some curry powders, some dry seasoning mixes, some gravy extracts, some meat sauces, some ketchups, some prepared mustards, and horseradish. Certain soy sauces. Malt vinegar. Bouillon and bouillon cubes that contain HVP. Some chip dips, and some chewing gum. Yeast extract. Brewer's yeast. Caramel color. Some medicines and supplements. Some lip glosses and other cosmetics. The items listed may not be a complete list. Talk with your dietitian about what dietary choices are best for you. Summary  Gluten is a protein that is found in wheat, rye, barley, and some other grains. The gluten-free diet includes all foods that do not contain gluten.  If you need help finding gluten-free foods or if you have questions, talk with your diet and nutrition specialist (registered dietitian) or your health care provider.  Read all food labels. Gluten is often added to foods. Always check the ingredient list and look for warnings, such as "may contain gluten." This information is not intended to replace advice given to you by your health care provider. Make sure you discuss any questions you have with your health care provider. Document Released: 05/14/2005 Document Revised: 02/27/2016 Document Reviewed: 02/27/2016 Elsevier Interactive Patient Education  2018 [place].  Lactose-Free Diet, Adult If you have lactose intolerance, you are not able to digest lactose. Lactose is a natural sugar found mainly in milk and milk products. You may need to avoid all foods and beverages that contain lactose. A lactose-free diet can help you do this. What do I need to know about this diet?  Do not consume foods, beverages, vitamins, minerals, or medicines with lactose. Read ingredients lists carefully.  Look for the words "lactose-free" on labels.  Use lactase enzyme drops or tablets as directed by your health care provider.  Use  lactose-free milk or a milk alternative, such as soy milk, for drinking and cooking.  Make sure you get enough calcium and vitamin D in your diet. A lactose-free eating plan can be lacking in these important nutrients.  Take calcium and vitamin D supplements as directed by your health care provider. Talk to your provider about supplements if you are not able to get enough calcium and vitamin D from food. Which foods have lactose? Lactose is found in:  Milk and foods made from milk.  Yogurt.  Cheese.  Butter.  Margarine.  Sour cream.  Cream.  Whipped toppings and nondairy creamers.  Ice cream and other milk-based desserts.  Lactose is also found in foods or products made with milk or milk ingredients. To find out whether a food contains milk or a milk ingredient, look at the ingredients list. Avoid foods with the statement "May contain milk" and foods that contain:  Butter.  Cream.  Milk.  Milk solids.  Milk powder.  Whey.  Curd.  Caseinate.  Lactose.  Lactalbumin.  Lactoglobulin.  What are some alternatives to milk and foods made with milk products?  Lactose-free milk.  Soy milk with added calcium and vitamin D.  Almond, coconut, or rice milk with added calcium and vitamin D. Note that these are low in protein.  Soy products, such as soy yogurt, soy cheese, soy ice cream, and soy-based sour cream. Which foods can I eat? Grains Breads and rolls made  without milk, such as [place], [place], or [place] bread, bagels, pita, and [employer]. Corn tortillas, corn meal, grits, and polenta. Crackers without lactose or milk solids, such as soda crackers and graham crackers. Cooked or dry cereals without lactose or milk solids. Pasta, quinoa, couscous, barley, oats, bulgur, farro, rice, wild rice, or other grains prepared without milk or lactose. Plain popcorn. Vegetables Fresh, frozen, and canned vegetables without cheese, cream, or butter sauces. Fruits All fresh,  canned, frozen, or dried fruits that are not processed with lactose. Meats and Other Protein Sources Plain beef, chicken, fish, [place], lamb, veal, pork, wild game, or ham. Kosher-prepared meat products. Strained or junior meats that do not contain milk. Eggs. Soy meat substitutes. Beans, lentils, and hummus. Tofu. Nuts and seeds. Peanut or other nut butters without lactose. Soups, casseroles, and mixed dishes without cheese, cream, or milk. Dairy Lactose-free milk. Soy, rice, or almond milk with added calcium and vitamin D. Soy cheese and yogurt. Beverages Carbonated drinks. Tea. Coffee, freeze-dried coffee, and some instant coffees. Fruit and vegetable juices. Condiments Soy sauce. Carob powder. Olives. Gravy made with water. Baker's cocoa. Angie Fava. Pure seasonings and spices. Ketchup. Mustard. Bouillon. Broth. Sweets and Desserts Water and fruit ices. Gelatin. Cookies, pies, or cakes made from allowed ingredients, such as angel food cake. Pudding made with water or a milk substitute. Lactose-free tofu desserts. Soy, coconut milk, or rice-milk-based frozen desserts. Sugar. Honey. Jam, jelly, and marmalade. Molasses. Pure sugar candy. Dark chocolate without milk. Marshmallows. Fats and Oils Margarines and salad dressings that do not contain milk. Berniece Salines. Vegetable oils. Shortening. Mayonnaise. Soy or coconut-based cream. The items listed above may not be a complete list of recommended foods or beverages. Contact your dietitian for more options. Which foods are not recommended? Grains Breads and rolls that contain milk. Toaster pastries. Muffins, biscuits, waffles, cornbread, and pancakes. These can be prepared at home, commercial, or from mixes. Sweet rolls, donuts, English muffins, fry bread, lefse, flour tortillas with lactose, or [place] toast made with milk or milk ingredients. Crackers that contain lactose. Corn curls. Cooked or dry cereals with lactose. Vegetables Creamed or breaded  vegetables. Vegetables in a cheese or butter sauce or with lactose-containing margarines. Instant potatoes. [place] fries. Scalloped or au gratin potatoes. Fruits None. Meats and Other Protein Sources Scrambled eggs, omelets, and souffles that contain milk. Creamed or breaded meat, fish, chicken, or [place]. Sausage products, such as wieners and liver sausage. Cold cuts that contain milk solids. Cheese, cottage cheese, ricotta cheese, and cheese spreads. Lasagna and macaroni and cheese. Pizza. Peanut or other nut butters with added milk solids. Casseroles or mixed dishes containing milk or cheese. Dairy All dairy products, including milk, goat's milk, buttermilk, kefir, acidophilus milk, flavored milk, evaporated milk, condensed milk, dulce de [place], eggnog, yogurt, cheese, and cheese spreads. Beverages Hot chocolate. Cocoa with lactose. Instant iced teas. Powdered fruit drinks. Smoothies made with milk or yogurt. Condiments Chewing gum that has lactose. Cocoa that has lactose. Spice blends if they contain milk products. Artificial sweeteners that contain lactose. Nondairy creamers. Sweets and Desserts Ice cream, ice milk, gelato, sherbet, and frozen yogurt. Custard, pudding, and mousse. Cake, cream pies, cookies, and other desserts containing milk, cream, cream cheese, or milk chocolate. Pie crust made with milk-containing margarine or butter. Reduced-calorie desserts made with a sugar substitute that contains lactose. Toffee and butterscotch. Milk, white, or dark chocolate that contains milk. Fudge. Caramel. Fats and Oils Margarines and salad dressings that contain milk or cheese. Cream. Half and  half. Cream cheese. Sour cream. Chip dips made with sour cream or yogurt. The items listed above may not be a complete list of foods and beverages to avoid. Contact your dietitian for more information. Am I getting enough calcium? Calcium is found in many foods that contain lactose and is important for bone  health. The amount of calcium you need depends on your age:  Adults younger than 50 years: 1000 mg of calcium a day.  Adults older than 50 years: 1200 mg of calcium a day.  If you are not getting enough calcium, other calcium sources include:  Orange juice with calcium added. There are 300-350 mg of calcium in 1 cup of orange juice.  Sardines with edible bones. There are 325 mg of calcium in 3 oz of sardines.  Calcium-fortified soy milk. There are 300-400 mg of calcium in 1 cup of calcium-fortified soy milk.  Calcium-fortified rice or almond milk. There are 300 mg of calcium in 1 cup of calcium-fortified rice or almond milk.  Canned salmon with edible bones. There are 180 mg of calcium in 3 oz of canned salmon with edible bones.  Calcium-fortified breakfast cereals. There are [phone] mg of calcium in calcium-fortified breakfast cereals.  Tofu set with calcium sulfate. There are 250 mg of calcium in  cup of tofu set with calcium sulfate.  Spinach, cooked. There are 145 mg of calcium in  cup of cooked spinach.  Edamame, cooked. There are 130 mg of calcium in  cup of cooked edamame.  Collard greens, cooked. There are 125 mg of calcium in  cup of cooked collard greens.  Kale, frozen or cooked. There are 90 mg of calcium in  cup of cooked or frozen kale.  Almonds. There are 95 mg of calcium in  cup of almonds.  Broccoli, cooked. There are 60 mg of calcium in 1 cup of cooked broccoli.  This information is not intended to replace advice given to you by your health care provider. Make sure you discuss any questions you have with your health care provider. Document Released: 11/03/2001 Document Revised: 10/20/2015 Document Reviewed: 08/14/2013 Elsevier Interactive Patient Education  2018 [employer].

## 2018-01-15 LAB — TISSUE TRANSGLUTAMINASE, IGA: (tTG) Ab, IgA: 1 U/mL

## 2018-01-21 ENCOUNTER — Ambulatory Visit: Payer: BLUE CROSS/BLUE SHIELD | Admitting: Gastroenterology

## 2018-01-21 ENCOUNTER — Encounter

## 2018-03-03 ENCOUNTER — Telehealth: Payer: Self-pay | Admitting: Internal Medicine

## 2018-03-03 NOTE — Telephone Encounter (Signed)
Patient requesting to speak with the nurse. Patient states she has a couple questions for the nurse before her appt on Wednesday 10.9.19. Patient did not go into further detail.

## 2018-03-03 NOTE — Telephone Encounter (Signed)
Pt states her husband is a Immunologist and that Dr. Hilarie Fredrickson had said he would do a "telephone call" with her to discuss her symptoms so that she would not have to come in for the [place] scheduled on Wednesday as this would free up a visit. Please call patient.

## 2018-03-04 ENCOUNTER — Ambulatory Visit: Payer: BLUE CROSS/BLUE SHIELD | Admitting: Internal Medicine

## 2018-03-05 ENCOUNTER — Ambulatory Visit: Payer: Self-pay | Admitting: Internal Medicine

## 2018-03-05 ENCOUNTER — Other Ambulatory Visit: Payer: Self-pay

## 2018-03-05 ENCOUNTER — Ambulatory Visit (INDEPENDENT_AMBULATORY_CARE_PROVIDER_SITE_OTHER): Payer: Self-pay | Admitting: Family Medicine

## 2018-03-05 ENCOUNTER — Encounter: Payer: Self-pay | Admitting: Family Medicine

## 2018-03-05 VITALS — BP 118/66 | HR 72 | Temp 98.0°F | Ht 65.0 in | Wt 120.9 lb

## 2018-03-05 DIAGNOSIS — Z8669 Personal history of other diseases of the nervous system and sense organs: Secondary | ICD-10-CM

## 2018-03-05 DIAGNOSIS — B009 Herpesviral infection, unspecified: Secondary | ICD-10-CM

## 2018-03-05 DIAGNOSIS — J4520 Mild intermittent asthma, uncomplicated: Secondary | ICD-10-CM

## 2018-03-05 MED ORDER — ALBUTEROL SULFATE HFA 108 (90 BASE) MCG/ACT IN AERS: 2.0000 | INHALATION_SPRAY | RESPIRATORY_TRACT | 0 refills | Status: DC | PRN

## 2018-03-05 MED ORDER — SUMATRIPTAN SUCCINATE 100 MG PO TABS: ORAL_TABLET | ORAL | 1 refills | Status: AC

## 2018-03-05 MED ORDER — VALACYCLOVIR HCL 1 G PO TABS: ORAL_TABLET | ORAL | 1 refills | Status: DC

## 2018-03-05 NOTE — Progress Notes (Signed)
Subjective:     Patient ID: Vicki [name], female   DOB: 1960/11/26, 57 y.o.   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  Her previous primary care physician had cultured this years ago and this came back positive for herpes.  She does not have any history of aphthous ulcers.  Never has had any involvement of her lips.  She has taken Valtrex in the past which helps.  Current outbreak started last night.  She had cold symptoms few weeks ago and thinks that may have lowered her her immunity.  No fever.  She has history of mild intermittent asthma.  Requesting refill of albuterol which she uses infrequently.  Also so requesting refill of Imitrex which she takes infrequently for migraine headaches  Past Medical History:  Diagnosis Date  . Allergy   . Anxiety    hx ?panic disorder  . Asthma   . Celiac disease   . Chronic gastritis   . GERD (gastroesophageal reflux disease)   . Hyperplastic colon polyp   . Migraines    chronic  . Schatzki's ring   . Skin cancer    chin area  . Thyroid disease    Past Surgical History:  Procedure Laterality Date  . ABLATION  2012  . CESAREAN SECTION  1984  . COLONOSCOPY  2011  . DILATION AND CURETTAGE OF UTERUS  2012  . POLYPECTOMY    . TONSILLECTOMY AND ADENOIDECTOMY  1964  . TUBAL LIGATION  1998  . UPPER GASTROINTESTINAL ENDOSCOPY  2011,1998    reports that she has never smoked. She has never used smokeless tobacco. She reports that she does not drink alcohol or use drugs. family history includes Alcohol abuse in her father and mother; Arthritis in her maternal grandmother; Asthma in her paternal aunt; Cancer in her mother; Cancer (age of onset: 51) in her sister; Colon cancer in her paternal aunt and paternal grandfather; Irritable bowel syndrome in her sister; Lupus in her sister; Stroke in her father. Allergies  Allergen Reactions   . Lamisil [Terbinafine] Hives  . Ceclor [Cefaclor]     hives  . Cephalosporins   . Latex   . Zoster Vaccine Live Rash     Review of Systems  Constitutional: Negative for chills and fever.  HENT: Negative for trouble swallowing.   Respiratory: Negative for shortness of breath.   Neurological: Negative for dizziness, weakness and headaches.  Hematological: Positive for adenopathy.       Objective:   Physical Exam  Constitutional: She appears well-developed and well-nourished.  HENT:  Mouth/Throat: No oropharyngeal exudate.  She has small vesicular lesion right side of tongue near the base.  No other oropharyngeal lesions.  No posterior pharynx exudate.  Neck: Neck supple.  Cardiovascular: Normal rate and regular rhythm.  Pulmonary/Chest: Effort normal and breath sounds normal.  Lymphadenopathy:    She has no cervical adenopathy.       Assessment:     #1 history of herpes simplex type I with somewhat unusual involving the tongue which is typical location she has had in the past and this has been cultured and positive for herpes previously as above  #2 history of mild intermittent asthma  #3 history of migraine headaches    Plan:     -Refilled albuterol for as needed use -Refill Imitrex for as needed use -Valtrex 1 g take  2 at onset of herpes lesion and 2 in 12 hours.  Eulas Post MD [facility]

## 2018-03-06 NOTE — Telephone Encounter (Signed)
I spoke to patient by phone after her recent email She really is doing well with the addition of pancreatic enzymes which seals the diagnosis of pancreatic insufficiency in the setting of known celiac disease.  Her celiac disease is under control She can continue Zenpep 40,000 units capsules and knows to take a dose dependent on the amount of food she is eating I would like to see her in about a years time, sooner if needed She thanked me for the call

## 2018-03-23 ENCOUNTER — Other Ambulatory Visit: Payer: Self-pay | Admitting: Family Medicine

## 2018-03-28 ENCOUNTER — Other Ambulatory Visit: Payer: Self-pay | Admitting: Family Medicine

## 2018-06-16 ENCOUNTER — Other Ambulatory Visit: Payer: Self-pay | Admitting: Family Medicine

## 2018-07-23 ENCOUNTER — Encounter: Payer: Self-pay | Admitting: Family Medicine

## 2018-07-23 ENCOUNTER — Ambulatory Visit (INDEPENDENT_AMBULATORY_CARE_PROVIDER_SITE_OTHER): Payer: BLUE CROSS/BLUE SHIELD | Admitting: Family Medicine

## 2018-07-23 ENCOUNTER — Other Ambulatory Visit: Payer: Self-pay

## 2018-07-23 VITALS — BP 120/76 | HR 66 | Temp 97.7°F | Ht 65.0 in | Wt 120.3 lb

## 2018-07-23 DIAGNOSIS — M25511 Pain in right shoulder: Secondary | ICD-10-CM

## 2018-07-23 NOTE — Progress Notes (Signed)
Subjective:     Patient ID: Vicki [name], female   DOB: 08/04/1960, 58 y.o.   MRN: [id number]  HPI Patient is seen with right shoulder pain.  She states back in September she was shaking a sheet and when she went up in the air with the sheets she felt a "pop" sensation in her right shoulder.  She has had some pain since then.  She saw orthopedist back in the fall and had plain x-rays which were unremarkable.  At that time, she did not have any medical insurance and she declined MRI.  She did get steroid injections also mild improvement but no resolution.  Her pain is now been progressive especially over recent weeks.  She enjoys paddle boarding and has had worsening symptoms with things like that.  She is having increased night pain.  She has pain with abduction as well as internal rotation.  Pain with dressing.  No relief with nonsteroidals such as Advil and Aleve.  No neck pain.  Question of some progressive weakness.  No upper extremity numbness.  Past Medical History:  Diagnosis Date  . Allergy   . Anxiety    hx ?panic disorder  . Asthma   . Celiac disease   . Chronic gastritis   . GERD (gastroesophageal reflux disease)   . Hyperplastic colon polyp   . Migraines    chronic  . Schatzki's ring   . Skin cancer    chin area  . Thyroid disease    Past Surgical History:  Procedure Laterality Date  . ABLATION  2012  . CESAREAN SECTION  1984  . COLONOSCOPY  2011  . DILATION AND CURETTAGE OF UTERUS  2012  . POLYPECTOMY    . TONSILLECTOMY AND ADENOIDECTOMY  1964  . TUBAL LIGATION  1998  . UPPER GASTROINTESTINAL ENDOSCOPY  2011,1998    reports that she has never smoked. She has never used smokeless tobacco. She reports that she does not drink alcohol or use drugs. family history includes Alcohol abuse in her father and mother; Arthritis in her maternal grandmother; Asthma in her paternal aunt; Cancer in her mother; Cancer (age of onset: 67) in her sister; Colon cancer in her  paternal aunt and paternal grandfather; Irritable bowel syndrome in her sister; Lupus in her sister; Stroke in her father. Allergies  Allergen Reactions  . Lamisil [Terbinafine] Hives  . Ceclor [Cefaclor]     hives  . Cephalosporins   . Latex   . Zoster Vaccine Live Rash     Review of Systems  Respiratory: Negative for cough and shortness of breath.   Cardiovascular: Negative for chest pain.  Musculoskeletal: Negative for neck pain.  Hematological: Negative for adenopathy.       Objective:   Physical Exam Constitutional:      Appearance: Normal appearance.  Cardiovascular:     Rate and Rhythm: Normal rate and regular rhythm.  Pulmonary:     Effort: Pulmonary effort is normal.     Breath sounds: Normal breath sounds.  Musculoskeletal:     Comments: Patient has pain with abduction greater than about 80 degrees.  She has pain with internal rotation.  No localized tenderness to palpation.  Neurological:     Mental Status: She is alert.     Comments: Symmetric upper extremity reflexes.  She has good grip strength bilaterally.  She does seem to have some rotator cuff weakness comparing the right to the left        Assessment:  Several month history of progressive right shoulder pain.  She had steroid injection as above per orthopedics several months ago without much improvement.  Concern is whether she is developing some early adhesive capsulitis and also concerns whether she has rotator cuff tear    Plan:     -Set up sports medicine referral for further evaluation.  She agrees with this plan.  Eulas Post MD [facility] at [facility]

## 2018-07-23 NOTE — Patient Instructions (Signed)

## 2018-07-25 ENCOUNTER — Ambulatory Visit (INDEPENDENT_AMBULATORY_CARE_PROVIDER_SITE_OTHER): Payer: BLUE CROSS/BLUE SHIELD | Admitting: Sports Medicine

## 2018-07-25 ENCOUNTER — Encounter: Payer: Self-pay | Admitting: Sports Medicine

## 2018-07-25 ENCOUNTER — Ambulatory Visit: Payer: Self-pay

## 2018-07-25 VITALS — BP 110/62 | HR 73 | Ht 65.0 in | Wt 119.4 lb

## 2018-07-25 DIAGNOSIS — M25511 Pain in right shoulder: Secondary | ICD-10-CM

## 2018-07-25 DIAGNOSIS — G2589 Other specified extrapyramidal and movement disorders: Secondary | ICD-10-CM | POA: Diagnosis not present

## 2018-07-25 DIAGNOSIS — M75101 Unspecified rotator cuff tear or rupture of right shoulder, not specified as traumatic: Secondary | ICD-10-CM

## 2018-07-25 NOTE — Progress Notes (Signed)
Juanda Bond. , [place] at [facility] [phone]  MASIAH LEWING - 58 y.o. female MRN [id number]  Date of birth: Sep 28, 1960  Visit Date: 07/25/2018  PCP: Eulas Post, MD   Referred by: Eulas Post, MD  SUBJECTIVE:   Chief Complaint  Patient presents with  . Right Shoulder - Initial Assessment    HPI: Right-hand-dominant female presenting after an acute injury in September 2019.  She reports shaking out her son's comforter and having an acute onset of pain.  She underwent subacromial injection Weston Anna with some moderate improvement she is actually had return of her symptoms up to an 8 out of 10 that is occasionally described as sharp.  She has occasional popping.  Symptoms do seem to be worsening.  REVIEW OF SYSTEMS: Reports night time disturbances when laying on this side Denies fevers, chills, or night sweats. Denies unexplained weight loss. Reports, personal history of skin cancer. Denies changes in bowel or bladder habits. 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 Denies recent unreported falls. Denies new or worsening dyspnea or wheezing. Reports, chronic headaches or dizziness.  Reports weakness In the extremities without numbness or tingling. Denies dizziness or presyncopal episodes Denies lower extremity edema   HISTORY:  Prior history reviewed and updated per electronic medical record.  Patient Active Problem List   Diagnosis Date Noted  . Gastroesophageal reflux disease without esophagitis 11/09/2015  . Belching 11/09/2015  . Loose stools 11/09/2015  . History of migraine headaches 01/09/2013  . Asthma, mild intermittent 01/31/2012  . GERD (gastroesophageal reflux disease) 01/31/2012  . Celiac disease 01/31/2012  . Hypothyroid 01/31/2012  . Panic disorder 01/31/2012   Social History   Occupational History  . Occupation: retired Therapist, sports  Tobacco Use  .  Smoking status: Never Smoker  . Smokeless tobacco: Never Used  Substance and Sexual Activity  . Alcohol use: No  . Drug use: No  . Sexual activity: Not on file   Social History   Social History Narrative  . Not on file   Past Medical History:  Diagnosis Date  . Allergy   . Anxiety    hx ?panic disorder  . Asthma   . Celiac disease   . Chronic gastritis   . GERD (gastroesophageal reflux disease)   . Hyperplastic colon polyp   . Migraines    chronic  . Schatzki's ring   . Skin cancer    chin area  . Thyroid disease    Past Surgical History:  Procedure Laterality Date  . ABLATION  2012  . CESAREAN SECTION  1984  . COLONOSCOPY  2011  . DILATION AND CURETTAGE OF UTERUS  2012  . POLYPECTOMY    . TONSILLECTOMY AND ADENOIDECTOMY  1964  . TUBAL LIGATION  1998  . UPPER GASTROINTESTINAL ENDOSCOPY  2011,1998   family history includes Alcohol abuse in her father and mother; Arthritis in her maternal grandmother; Asthma in her paternal aunt; Cancer in her mother; Cancer (age of onset: 42) in her sister; Colon cancer in her paternal aunt and paternal grandfather; Irritable bowel syndrome in her sister; Lupus in her sister; Stroke in her father. There is no history of Heart disease, Esophageal cancer, Stomach cancer, or Rectal cancer.  OBJECTIVE:  VS:  HT:5' 5"  (165.1 cm)   WT:119 lb 6.4 oz (54.2 kg)  BMI:19.87    BP:110/62  HR:73bpm  TEMP: ( )  RESP:98 %   PHYSICAL EXAM: CONSTITUTIONAL: Well-developed,  Well-nourished and In no acute distress EYES: Pupils are equal., EOM intact without nystagmus. and No scleral icterus. Psychiatric: Alert & appropriately interactive. and Not depressed or anxious appearing. EXTREMITY EXAM: Warm and well perfused  Right shoulder is overall well aligned.  She has pain with Luan Pulling as well as Neer's.  Pain with end-stage external rotation while held at 30 degrees of abduction with well-maintained range of motion.  Her intrinsic rotator cuff  strength with internal rotation, external rotation, empty can, liftoff test are all normal although slightly painful with empty can testing.  She does have a marked anterior chain dominance of her shoulders.  Full overhead range of motion with minimal endrange pain.   ASSESSMENT:   1. Right shoulder pain, unspecified chronicity   2. Rotator cuff syndrome of right shoulder   3. Scapular dyskinesis     PROCEDURES:  US Guided Injection per procedure note   PROCEDURE NOTE: THERAPEUTIC EXERCISES (99357)  Discussed the foundation of treatment for this condition is physical therapy and/or daily (5-6 days/week) therapeutic exercises, focusing on core strengthening, coordination, neuromuscular control/reeducation. 15 minutes spent for Therapeutic exercises as below and as referenced in the AVS. This included exercises focusing on stretching, strengthening, with significant focus on eccentric aspects.  Proper technique shown and discussed handout in great detail with ATC. All questions were discussed and answered.  Long term goals include an improvement in range of motion, strength, endurance as well as avoiding reinjury. Frequency of visits is one time as determined during today's office visit. Frequency of exercises to be performed is as per handout. EXERCISES REVIEWED: Codman's Exercise Intrinsic Rotator Cuff Exercises Scapular Stabilization     PLAN:  Pertinent additional documentation may be included in corresponding procedure notes, imaging studies, problem based documentation and patient instructions.  No problem-specific Assessment & Plan notes found for this encounter.   Symptoms are consistent with a bursal sided rotator cuff tear that appears to be incomplete/partial thickness.  She does have some underlying scapular dyskinesis as well as mild end range pain that may be reflective of early adhesive capsulitis.  Intra-articular injection performed today given the symptomatic findings.   She will likely benefit from nitroglycerin protocol for rotator cuff once we decrease the acute symptomatic findings.  Home Therapeutic exercises prescribed today per procedure note.  TENDINOPATHY - Discussed that the anticipated amount of time for healing is 12- 24 weeks for Tendinopathic changes.  Emphasized the importance of improving blood flow as well as eccentric loading of the tendon.   Activity modifications and the importance of avoiding exacerbating activities (limiting pain to no more than a 4 / 10 during or following activity) recommended and discussed.  Discussed red flag symptoms that warrant earlier emergent evaluation and patient voices understanding.   No orders of the defined types were placed in this encounter.  Lab Orders  No laboratory test(s) ordered today   Imaging Orders     [place] [facility] POCT ULTRASOUND Referral Orders  No referral(s) requested today    Return in about 3 weeks (around 08/15/2018).  To consider advancing therapeutic exercises and add addition of nitroglycerin protocol.         Gerda Diss, [place] [facility] Physician

## 2018-07-25 NOTE — Patient Instructions (Addendum)

## 2018-07-27 ENCOUNTER — Encounter: Payer: Self-pay | Admitting: Sports Medicine

## 2018-07-27 NOTE — Procedures (Addendum)
PROCEDURE NOTE:  Ultrasound Guided: Injection: Right shoulder, Intra-articular Images were obtained and interpreted by myself, Teresa Coombs, DO  Images have been saved and stored to PACS system. Images obtained on: GE S7 Ultrasound machine    ULTRASOUND FINDINGS:  Biceps Tendon: Normal Pec Major Insertion: Normal Subscapularis Tendon: Normal inferior fibers with rotator cuff interval findings as below. Supraspinatus Tendon: Marked thickening, rotator cuff interval does show a articular sided vertical split and that is consistent with a partial-thickness rotator cuff interval tear.  There is no direct retraction with dynamic testing.  No significant neovascularity.  Small subacromial bursa. Infraspinatus/Teres Minor Tendon: Normal AC Joint: Mild degenerative spurring. JOINT: No significant GH spurring appreciated, small intra-articular effusion.   LABRUM: Incompletely evaluated.   DESCRIPTION OF PROCEDURE:  The patient's clinical condition is marked by substantial pain and/or significant functional disability. Other conservative therapy has not provided relief, is contraindicated, or not appropriate. There is a reasonable likelihood that injection will significantly improve the patient's pain and/or functional impairment.   After discussing the risks, benefits and expected outcomes of the injection and all questions were reviewed and answered, the patient wished to undergo the above named procedure.  Verbal consent was obtained.  The ultrasound was used to identify the target structure and adjacent neurovascular structures. The skin was then prepped in sterile fashion and the target structure was injected under direct visualization using sterile technique as below:  Single injection performed as below: PREP: Alcohol and Ethel Chloride APPROACH:posterior, single injection, 25g 1.5 in. INJECTATE: 2 cc 0.5% Marcaine and 1 cc 47m/mL DepoMedrol ASPIRATE: None DRESSING: Band-Aid  Post  procedural instructions including recommending icing and warning signs for infection were reviewed.    This procedure was well tolerated and there were no complications.   IMPRESSION: Succesful Ultrasound Guided: Injection

## 2018-07-28 ENCOUNTER — Encounter: Payer: Self-pay | Admitting: Sports Medicine

## 2018-07-28 ENCOUNTER — Ambulatory Visit (INDEPENDENT_AMBULATORY_CARE_PROVIDER_SITE_OTHER): Payer: BLUE CROSS/BLUE SHIELD | Admitting: Sports Medicine

## 2018-07-28 DIAGNOSIS — M25511 Pain in right shoulder: Secondary | ICD-10-CM

## 2018-07-28 DIAGNOSIS — M75101 Unspecified rotator cuff tear or rupture of right shoulder, not specified as traumatic: Secondary | ICD-10-CM | POA: Diagnosis not present

## 2018-07-28 DIAGNOSIS — G2589 Other specified extrapyramidal and movement disorders: Secondary | ICD-10-CM | POA: Diagnosis not present

## 2018-07-28 NOTE — Progress Notes (Signed)
  Juanda Bond. , [place] at [facility] [phone]  EARLENE BJELLAND - 58 y.o. female MRN [id number]  Date of birth: [birth year]  Visit Date: 07/28/2018  PCP: Eulas Post, MD   Referred by: Eulas Post, MD  SUBJECTIVE:   Chief Complaint  Patient presents with  . Follow-up    R shoulder pain and HEP review    HPI: Patient is here today for follow-up of her right shoulder pain.  She is having issues with the home therapeutic exercises and concerned she is not doing them appropriately.  Her symptoms have overall minimally improved.  REVIEW OF SYSTEMS: No significant nighttime awakenings due to this issue. Denies fevers, chills, recent weight gain or weight loss.  No night sweats.  Pt denies any change in bowel or bladder habits, muscle weakness, numbness or falls associated with this pain.  HISTORY:  Prior history reviewed and updated per electronic medical record.  Patient Active Problem List   Diagnosis Date Noted  . Gastroesophageal reflux disease without esophagitis 11/09/2015  . Belching 11/09/2015  . Loose stools 11/09/2015  . History of migraine headaches 01/09/2013  . Asthma, mild intermittent 01/31/2012  . GERD (gastroesophageal reflux disease) 01/31/2012  . Celiac disease 01/31/2012  . Hypothyroid 01/31/2012  . Panic disorder 01/31/2012   Social History   Occupational History  . Occupation: retired Therapist, sports  Tobacco Use  . Smoking status: Never Smoker  . Smokeless tobacco: Never Used  Substance and Sexual Activity  . Alcohol use: No  . Drug use: No  . Sexual activity: Not on file   Social History   Social History Narrative  . Not on file    OBJECTIVE:  VS:  HT:    WT:   BMI:     BP:   HR: bpm  TEMP: ( )  RESP:    PHYSICAL EXAM: Adult female. No acute distress.  Alert and appropriate. Right shoulder continues to have scapular dyskinesis.  Some pain with rotator cuff testing but this is  minimal.   ASSESSMENT:   1. Right shoulder pain, unspecified chronicity   2. Rotator cuff syndrome of right shoulder   3. Scapular dyskinesis     PROCEDURES:  PROCEDURE NOTE: THERAPEUTIC EXERCISES (46659)  Discussed the foundation of treatment for this condition is physical therapy and/or daily (5-6 days/week) therapeutic exercises, focusing on core strengthening, coordination, neuromuscular control/reeducation. 15 minutes spent for Therapeutic exercises as below and as referenced in the AVS. This included exercises focusing on stretching, strengthening, with significant focus on eccentric aspects.  Proper technique shown and discussed handout in great detail with ATC. All questions were discussed and answered.  Long term goals include an improvement in range of motion, strength, endurance as well as avoiding reinjury. Frequency of visits is one time as determined during today's office visit. Frequency of exercises to be performed is as per handout. EXERCISES REVIEWED: Intrinsic Rotator Cuff Exercises Scapular Stabilization     PLAN:  Pertinent additional documentation may be included in corresponding procedure notes, imaging studies, problem based documentation and patient instructions.   Therapeutic exercises were reviewed in detail per procedure note.   Return for Previously scheduled follow-up visit.Gerda Diss, [place] [facility] Physician

## 2018-08-04 ENCOUNTER — Other Ambulatory Visit: Payer: Self-pay | Admitting: Family Medicine

## 2018-08-05 ENCOUNTER — Other Ambulatory Visit: Payer: Self-pay | Admitting: Family Medicine

## 2018-08-15 ENCOUNTER — Ambulatory Visit: Payer: BLUE CROSS/BLUE SHIELD | Admitting: Sports Medicine

## 2018-08-18 ENCOUNTER — Ambulatory Visit (INDEPENDENT_AMBULATORY_CARE_PROVIDER_SITE_OTHER): Payer: BLUE CROSS/BLUE SHIELD | Admitting: Sports Medicine

## 2018-08-18 ENCOUNTER — Encounter: Payer: Self-pay | Admitting: Sports Medicine

## 2018-08-18 DIAGNOSIS — M25511 Pain in right shoulder: Secondary | ICD-10-CM

## 2018-08-18 DIAGNOSIS — M75101 Unspecified rotator cuff tear or rupture of right shoulder, not specified as traumatic: Secondary | ICD-10-CM

## 2018-08-18 DIAGNOSIS — G2589 Other specified extrapyramidal and movement disorders: Secondary | ICD-10-CM

## 2018-08-18 MED ORDER — NITROGLYCERIN 0.2 MG/HR TD PT24: MEDICATED_PATCH | TRANSDERMAL | 1 refills | Status: DC

## 2018-08-18 NOTE — Patient Instructions (Signed)

## 2018-08-18 NOTE — Progress Notes (Signed)
Vicki [name]. , [place] at [place] - 58 y.o. female MRN [id number]  Date of birth: 26-Dec-1960  Visit Date: August 18, 2018  PCP: Eulas Post, MD   Referred by: Eulas Post, MD    Virtual Visit via Video   I connected with the patient on 08/18/18 at  9:00 AM EDT by a video enabled telemedicine application and verified that I am speaking with the correct person using two identifiers. Location patient: Home Location provider: [facility], Office Persons participating in the virtual visit: patient Duration of face-to-face encounter: 12 minutes  I discussed the limitations of evaluation and management by telemedicine and the availability of in person appointments. The patient expressed understanding and agreed to proceed.   SUBJECTIVE:  Chief Complaint  Patient presents with  . Right Shoulder - Follow-up    HPI: Patient reports that overall she is doing significantly better.  She is no longer having any day-to-day pain but occasionally gets a 1-2 out of 10 pain especially with overhead reaching and reaching back behind her such as behind the passenger seat of the car.  She is not having any nighttime disturbances.  She has been doing well with performing home therapeutic exercises.  No longer having to take any medications.  No side effects to the injection.  REVIEW OF SYSTEMS: No significant nighttime awakenings due to this issue. Denies fevers, chills, recent weight gain or weight loss.  No night sweats.  Pt denies any change in bowel or bladder habits, muscle weakness, numbness or falls associated with this pain.  HISTORY:  Prior history reviewed and updated per electronic medical record.  Patient Active Problem List   Diagnosis Date Noted  . Gastroesophageal reflux disease without esophagitis 11/09/2015  . Belching 11/09/2015  . Loose stools 11/09/2015  . History of migraine  headaches 01/09/2013  . Asthma, mild intermittent 01/31/2012  . GERD (gastroesophageal reflux disease) 01/31/2012  . Celiac disease 01/31/2012  . Hypothyroid 01/31/2012  . Panic disorder 01/31/2012   Social History   Occupational History  . Occupation: retired Therapist, sports  Tobacco Use  . Smoking status: Never Smoker  . Smokeless tobacco: Never Used  Substance and Sexual Activity  . Alcohol use: No  . Drug use: No  . Sexual activity: Not on file   Social History   Social History Narrative  . Not on file    OBJECTIVE:  VS:  HT:    WT:   BMI:     BP:   HR: bpm  TEMP: ( )  RESP:    PHYSICAL EXAM: Adult female. No acute distress.  Alert and appropriate. Sitting comfortably.  Overhead range of motion of her shoulders is good.  Strength is deferred due to virtual visit.   ASSESSMENT:   1. Right shoulder pain, unspecified chronicity   2. Rotator cuff syndrome of right shoulder   3. Scapular dyskinesis     PROCEDURES:  None  PLAN:  Pertinent additional documentation may be included in corresponding procedure notes, imaging studies, problem based documentation and patient instructions.  No problem-specific Assessment & Plan notes found for this encounter.  Overall she is progressing well.  Given the persistent some light pain and the prior findings on her ultrasound transition to nitroglycerin protocol discussed in detail.  She does have a history of "migraines" that sound to be more tension-like symptoms.  Home Therapeutic Exercises: Continue previously prescribed home exercise program  TENDINOPATHY - Discussed that the anticipated amount of time for healing is 12- 24 weeks for Tendinopathic changes.  Emphasized the importance of improving blood flow as well as eccentric loading of the tendon.  NITRO PROTOCOL - Discussed options with the patient today including biologic treatment with topical nitroglycerin. Patient has no contraindications & understands the risks, benefits  and intentions of treatment. Emphasized the importance of rotating sites as well as appropriate and expected adverse reactions including orthostasis, headache, adhesive sensitivity.    Activity modifications and the importance of avoiding exacerbating activities (limiting pain to no more than a 4 / 10 during or following activity) recommended and discussed.   Discussed red flag symptoms that warrant earlier emergent evaluation and patient voices understanding.    Meds ordered this encounter  Medications  . nitroGLYCERIN (NITRODUR - DOSED IN MG/24 HR) 0.2 mg/hr patch    Sig: Place 1/4 to 1/2 of a patch over affected region. Remove and replace once daily.  Slightly alter skin placement daily    Dispense:  30 patch    Refill:  1    For musculoskeletal purposes.  Okay to cut patch.   Lab Orders  No laboratory test(s) ordered today   Imaging Orders  No imaging studies ordered today   Referral Orders  No referral(s) requested today    Return in about 8 weeks (around 10/13/2018) for repeat diagnostic ultrasound.          Gerda Diss, [place] [facility] Physician

## 2018-08-21 ENCOUNTER — Other Ambulatory Visit: Payer: Self-pay | Admitting: Internal Medicine

## 2018-09-12 DIAGNOSIS — H43811 Vitreous degeneration, right eye: Secondary | ICD-10-CM | POA: Diagnosis not present

## 2018-10-13 ENCOUNTER — Ambulatory Visit: Payer: BLUE CROSS/BLUE SHIELD | Admitting: Sports Medicine

## 2018-10-14 ENCOUNTER — Other Ambulatory Visit: Payer: Self-pay

## 2018-10-14 ENCOUNTER — Ambulatory Visit: Payer: Self-pay

## 2018-10-14 ENCOUNTER — Ambulatory Visit (INDEPENDENT_AMBULATORY_CARE_PROVIDER_SITE_OTHER): Payer: BLUE CROSS/BLUE SHIELD | Admitting: Family Medicine

## 2018-10-14 ENCOUNTER — Encounter: Payer: Self-pay | Admitting: Family Medicine

## 2018-10-14 ENCOUNTER — Ambulatory Visit (INDEPENDENT_AMBULATORY_CARE_PROVIDER_SITE_OTHER)
Admission: RE | Admit: 2018-10-14 | Discharge: 2018-10-14 | Disposition: A | Discharge status: Home / Self Care | Payer: BLUE CROSS/BLUE SHIELD | Source: Ambulatory Visit | Attending: Family Medicine | Admitting: Family Medicine

## 2018-10-14 VITALS — BP 124/62 | HR 83 | Ht 65.0 in | Wt 119.0 lb

## 2018-10-14 DIAGNOSIS — G8929 Other chronic pain: Secondary | ICD-10-CM

## 2018-10-14 DIAGNOSIS — M25511 Pain in right shoulder: Secondary | ICD-10-CM

## 2018-10-14 DIAGNOSIS — M7500 Adhesive capsulitis of unspecified shoulder: Secondary | ICD-10-CM | POA: Insufficient documentation

## 2018-10-14 DIAGNOSIS — M7501 Adhesive capsulitis of right shoulder: Secondary | ICD-10-CM | POA: Diagnosis not present

## 2018-10-14 MED ORDER — VITAMIN D (ERGOCALCIFEROL) 1.25 MG (50000 UNIT) PO CAPS: 50000.0000 [IU] | ORAL_CAPSULE | ORAL | 0 refills | Status: DC

## 2018-10-14 MED ORDER — MELOXICAM 15 MG PO TABS: 15.0000 mg | ORAL_TABLET | Freq: Every day | ORAL | 0 refills | Status: DC

## 2018-10-14 NOTE — Patient Instructions (Addendum)
Good to see you  Ice 20 minutes 2 times daily. Usually after activity and before bed. More frozen shoulder and starting to new exercises  We will do PT at [facility]  meloxicam daily for 10 days then as needed Once weekly vitamin D for 12 weeks Se eme again in 5 weeks to make sure you are doing better

## 2018-10-14 NOTE — Assessment & Plan Note (Signed)
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  Persistence of the pain is improving and the strength is improving a little bit but unfortunately range of motion is now.  Start with formal physical therapy, patient discontinued the nitroglycerin patches.  We discussed vitamin D supplementation and how this could be more beneficial.  Discussed meloxicam for breakthrough pain.  Patient will start formal physical therapy.  Follow-up with me again in 4 to 6 weeks

## 2018-10-14 NOTE — Progress Notes (Signed)
[name], [place] [address] Phone: [phone] Subjective:   [name], am serving as a scribe for Dr. Hulan Saas.    CC: Right shoulder pain follow-up  JSH:[id number]  Vicki [name] is a 58 y.o. female coming in with complaint of right shoulder pain. Patient has seen Dr. Paulla Fore since February 2020. Patient states that she has been having pain since September. Is an avid stand up paddle boarder and has a large dog that pulls her arm on their walks. Does have pain at night. Pain occurs throughout the entire joint especially with shoulder extension and external rotation. Did have steroid injection but she is unsure if the injection worked. Has limited flexion since September.        Past Medical History:  Diagnosis Date  . Allergy   . Anxiety    hx ?panic disorder  . Asthma   . Celiac disease   . Chronic gastritis   . GERD (gastroesophageal reflux disease)   . Hyperplastic colon polyp   . Migraines    chronic  . Schatzki's ring   . Skin cancer    chin area  . Thyroid disease    Past Surgical History:  Procedure Laterality Date  . ABLATION  2012  . CESAREAN SECTION  1984  . COLONOSCOPY  2011  . DILATION AND CURETTAGE OF UTERUS  2012  . POLYPECTOMY    . TONSILLECTOMY AND ADENOIDECTOMY  1964  . TUBAL LIGATION  1998  . UPPER GASTROINTESTINAL ENDOSCOPY  2011,1998   Social History   Socioeconomic History  . Marital status: Married    Spouse name: Vicki [name]  . Number of children: 4  . Years of education: Not on file  . Highest education level: Not on file  Occupational History  . Occupation: retired Animal nutritionist  . Financial resource strain: Not on file  . Food insecurity:    Worry: Not on file    Inability: Not on file  . Transportation needs:    Medical: Not on file    Non-medical: Not on file  Tobacco Use  . Smoking status: Never Smoker  . Smokeless tobacco: Never Used  Substance and Sexual  Activity  . Alcohol use: No  . Drug use: No  . Sexual activity: Not on file  Lifestyle  . Physical activity:    Days per week: Not on file    Minutes per session: Not on file  . Stress: Not on file  Relationships  . Social connections:    Talks on phone: Not on file    Gets together: Not on file    Attends religious service: Not on file    Active member of club or organization: Not on file    Attends meetings of clubs or organizations: Not on file    Relationship status: Not on file  Other Topics Concern  . Not on file  Social History Narrative  . Not on file   Allergies  Allergen Reactions  . Lamisil [Terbinafine] Hives  . Ceclor [Cefaclor]     hives  . Cephalosporins   . Latex   . Zoster Vaccine Live Rash   Family History  Problem Relation Age of Onset  . Alcohol abuse Mother   . Cancer Mother        breast  . Alcohol abuse Father   . Stroke Father   . Asthma Paternal Aunt   . Colon cancer Paternal Aunt   .  Arthritis Maternal Grandmother   . Cancer Sister 69       breast cancer  . Lupus Sister   . Irritable bowel syndrome Sister   . Colon cancer Paternal Grandfather   . Heart disease Neg Hx   . Esophageal cancer Neg Hx   . Stomach cancer Neg Hx   . Rectal cancer Neg Hx       Current Outpatient Medications (Cardiovascular):  .  nitroGLYCERIN (NITRODUR - DOSED IN MG/24 HR) 0.2 mg/hr patch, Place 1/4 to 1/2 of a patch over affected region. Remove and replace once daily.  Slightly alter skin placement daily   Current Outpatient Medications (Respiratory):  .  albuterol (PROVENTIL HFA;VENTOLIN HFA) 108 (90 Base) MCG/ACT inhaler, INHALE 2 PUFFS INTO THE LUNGS EVERY 4 (FOUR) HOURS AS NEEDED FOR WHEEZING OR SHORTNESS OF BREATH. .  mometasone (NASONEX) 50 MCG/ACT nasal spray, USE 2 SPRAYS NASALLY EVERY DAY .  pseudoephedrine (SUDAFED) 30 MG tablet, Take by mouth.   Current Outpatient Medications (Analgesics):  .  meloxicam (MOBIC) 15 MG tablet, Take 1 tablet  (15 mg total) by mouth daily. .  SUMAtriptan (IMITREX) 100 MG tablet, TAKE 1 TABLET BY MOUTH EVERY 2 HOURS AS NEEDED FOR MIGRAINE     Current Outpatient Medications (Other):  .  baclofen (LIORESAL) 10 MG tablet, Take 10 mg by mouth every 8 (eight) hours as needed (for migraines). .  diphenoxylate-atropine (LOMOTIL) 2.5-0.025 MG tablet, Take 1 tablet by mouth 4 (four) times daily as needed for diarrhea or loose stools. Marland Kitchen  estradiol (ESTRACE) 0.1 MG/GM vaginal cream, Place vaginally. .  fluconazole (DIFLUCAN) 100 MG tablet, Take 1 tablet (100 mg total) by mouth daily. .  Multiple Vitamins-Minerals (WOMENS MULTIVITAMIN PLUS PO), Take by mouth daily. Marland Kitchen  PARoxetine (PAXIL) 10 MG tablet, TAKE 1 TABLET BY MOUTH EVERY DAY IN THE MORNING .  valACYclovir (VALTREX) 1000 MG tablet, Take two at onset of cold sore and repeat 2 in 12 hours. .  Vitamin D, Ergocalciferol, (DRISDOL) 1.25 MG (50000 UT) CAPS capsule, Take 1 capsule (50,000 Units total) by mouth every 7 (seven) days. Marland Kitchen  ZENPEP 40000-126000 units CPEP, TAKE 2 CAPSULES BY MOUTH 3 TIMES DAILY WITH MEALS. AND 1 CAPSULE WITH EACH SNACK  Current Facility-Administered Medications (Other):  .  0.9 %  sodium chloride infusion    Past medical history, social, surgical and family history all reviewed in electronic medical record.  No pertanent information unless stated regarding to the chief complaint.   Review of Systems:  No headache, visual changes, nausea, vomiting, diarrhea, constipation, dizziness, abdominal pain, skin rash, fevers, chills, night sweats, weight loss, swollen lymph nodes, body aches, joint swelling,  chest pain, shortness of breath, mood changes.  +muscle aches   Objective  Blood pressure 124/62, pulse 83, height 5' 5"  (1.651 m), weight 119 lb (54 kg), SpO2 98 %.    General: No apparent distress alert and oriented x3 mood and affect normal, dressed appropriately.  HEENT: Pupils equal, extraocular movements intact  Respiratory:  Patient's speak in full sentences and does not appear short of breath  Cardiovascular: No lower extremity edema, non tender, no erythema  Skin: Warm dry intact with no signs of infection or rash on extremities or on axial skeleton.  Abdomen: Soft nontender  Neuro: Cranial nerves II through XII are intact, neurovascularly intact in all extremities with 2+ DTRs and 2+ pulses.  Lymph: No lymphadenopathy of posterior or anterior cervical chain or axillae bilaterally.  Gait normal with good balance  and coordination.  MSK:  Non tender with full range of motion and good stability and symmetric strength and tone of  elbows, wrist, hip, knee and ankles bilaterally. Right shoulder exam:  Decreased range of motion in all planes. Positive impingement patient has +5 strength Shoulder exam shows that patient does have positive impingement.  Neurovascular intact distally  MSK US performed of: Right shoulder This study was ordered, performed, and interpreted by Charlann Boxer D.O.  Shoulder:   Supraspinatus: No true tear appreciated.  Mild subacromial bursitis noted.  Significant thickening of the anterior capsule Patient is glenohumeral joint intact.  Seen this with patient's acromioclavicular joint.  Impression: Possible frozen shoulder.    Impression and Recommendations:     This case required medical decision making of moderate complexity. The above documentation has been reviewed and is accurate and complete Lyndal Pulley, DO       Note: This dictation was prepared with Dragon dictation along with smaller phrase technology. Any transcriptional errors that result from this process are unintentional.

## 2018-10-15 ENCOUNTER — Encounter: Payer: Self-pay | Admitting: Family Medicine

## 2018-10-16 ENCOUNTER — Ambulatory Visit: Payer: BC Managed Care – PPO | Attending: Family Medicine | Admitting: Physical Therapy

## 2018-10-16 ENCOUNTER — Encounter: Payer: Self-pay | Admitting: Physical Therapy

## 2018-10-16 ENCOUNTER — Other Ambulatory Visit: Payer: Self-pay

## 2018-10-16 DIAGNOSIS — M25611 Stiffness of right shoulder, not elsewhere classified: Secondary | ICD-10-CM | POA: Insufficient documentation

## 2018-10-16 DIAGNOSIS — M6281 Muscle weakness (generalized): Secondary | ICD-10-CM | POA: Insufficient documentation

## 2018-10-16 DIAGNOSIS — M25511 Pain in right shoulder: Secondary | ICD-10-CM | POA: Insufficient documentation

## 2018-10-16 DIAGNOSIS — G8929 Other chronic pain: Secondary | ICD-10-CM | POA: Insufficient documentation

## 2018-10-16 NOTE — Patient Instructions (Signed)
Access Code: [email or web address]  URL: [email or web address]  Date: 10/16/2018  Prepared by: Ruben Im   Exercises  Seated Shoulder Flexion AAROM with Pulley Behind - 10 reps - 3 sets - 1x daily - 7x weekly  Seated Shoulder Flexion Towel Slide at Table Top - 10 reps - 3 sets - 1x daily - 7x weekly  Shoulder Mobilization [employer] - 10 reps - 3 sets - 1x daily - 7x weekly

## 2018-10-16 NOTE — Therapy (Signed)
[facility] [address] [place], [place], [address] Phone: [phone]   Fax:  [phone]  Physical Therapy Evaluation  Patient Details  Name: Vicki [name] MRN: [id number] Date of Birth: 1961/02/10 Referring Provider (PT): Dr. Charlann Boxer    Encounter Date: 10/16/2018  PT End of Session - 10/16/18 1115    Visit Number  1    Number of Visits  28    Date for PT Re-Evaluation  12/11/18    Authorization Type  [employer]--per [place]  2 used of 30 visits     PT Start Time  1000    PT Stop Time  1100    PT Time Calculation (min)  60 min    Activity Tolerance  Patient tolerated treatment well       Past Medical History:  Diagnosis Date  . Allergy   . Anxiety    hx ?panic disorder  . Asthma   . Celiac disease   . Chronic gastritis   . GERD (gastroesophageal reflux disease)   . Hyperplastic colon polyp   . Migraines    chronic  . Schatzki's ring   . Skin cancer    chin area  . Thyroid disease     Past Surgical History:  Procedure Laterality Date  . ABLATION  2012  . CESAREAN SECTION  1984  . COLONOSCOPY  2011  . DILATION AND CURETTAGE OF UTERUS  2012  . POLYPECTOMY    . TONSILLECTOMY AND ADENOIDECTOMY  1964  . TUBAL LIGATION  1998  . UPPER GASTROINTESTINAL ENDOSCOPY  6144,3154    There were no vitals filed for this visit.   Subjective Assessment - 10/16/18 1004    Subjective  Started in September.  Unsure of injury:  65# dog, stand up paddle boarding or shaking out a comforter.  Had steroid shot in November no effect.  Paddle board in [place] on windy day and that cranked it up again.  Refered to [facility] medicine and got steroid shot again.  Minimal help.  Pain 24 hours a day.  Saw Dr. Tamala Julian and ordered x-ray.  Doing some ex's     Pertinent History  celiac disease which affects nutrient absorption;  hx of left frozen shoulder 10-12 years ago took a long time to resolve attributed to flu shot, had PT and 2  rounds of dose pack     Limitations  Lifting    Diagnostic tests  xray:  subtle fracture superior humeral head with a subacute appearance;  referred for bone density     Patient Stated Goals  be able to sleep the night without waking in pain;  not have pain all the time; both sides equal ROM     Currently in Pain?  Yes    Pain Score  2     Pain Location  Shoulder    Pain Orientation  Right    Pain Type  Chronic pain    Pain Radiating Towards  like a toothache     Pain Onset  More than a month ago    Pain Frequency  Constant    Aggravating Factors   sore early AM;  after ex 5/10 pain ;  yardwork ;  reaching back to tuck in shirt; pulling up pants;  shaving underarms, deodorant     Pain Relieving Factors  hot shower;  Meloxicam; lunch time [place]         [facility] PT Assessment - 10/16/18 0001  Assessment   Medical Diagnosis  adhesive capsulitis right shoulder     Referring Provider (PT)  Dr. Charlann Boxer     Onset Date/Surgical Date  --   Sept   Next MD Visit  5 weeks 11/18/18    Prior Therapy  previous frozen shoulder      Precautions   Precautions  None      Restrictions   Weight Bearing Restrictions  No      Balance Screen   Has the patient fallen in the past 6 months  No    Has the patient had a decrease in activity level because of a fear of falling?   No    Is the patient reluctant to leave their home because of a fear of falling?   No      Home Film/video editor residence    Living Arrangements  Spouse/significant other      Prior Function   Level of [place]  Retired    Sales executive, read, Market researcher, fairy garden, swim      Observation/Other Assessments   Focus on Therapeutic Outcomes (FOTO)   58% limitation       AROM   Right Shoulder Flexion  128 Degrees    Right Shoulder ABduction  87 Degrees   pain   Right Shoulder Internal Rotation  --   sacrum; pain and tightness   Right Shoulder External  Rotation  50 Degrees   pain   Left Shoulder Flexion  160 Degrees    Left Shoulder ABduction  178 Degrees    Left Shoulder Internal Rotation  74 Degrees   T10     Strength   Right Shoulder Flexion  3-/5    Right Shoulder ABduction  2+/5    Right Shoulder Internal Rotation  3/5    Right Shoulder External Rotation  3/5    Left Shoulder Flexion  5/5    Left Shoulder Extension  5/5    Left Shoulder ABduction  5/5    Left Shoulder Internal Rotation  5/5    Left Shoulder External Rotation  5/5      Palpation   Palpation comment  tender points posterior shoulder supraspinatus      Empty Can test   Findings  Negative      Lag time at 0 degrees   Findings  Positive    Side  Right      Drop Arm test   Findings  Positive    Side  Right                Objective measurements completed on examination: See above findings.              PT Education - 10/16/18 1056    Education Details   Access Code: [email or web address] shoulder pulleys, table slides, ball rolls on counter; DN info    Person(s) Educated  Patient    Methods  Explanation;Demonstration;Handout    Comprehension  Returned demonstration;Verbalized understanding       PT Short Term Goals - 10/16/18 1131      PT SHORT TERM GOAL #1   Title  The patient will demonstrate understand initial ROM ex HEP on a low load but frequent basis    Time  4    Period  Weeks    Status  New    Target Date  11/13/18      PT SHORT TERM GOAL #  2   Title  The patient will report a 25% improvement in pain with dressing and grooming tasks    Time  4    Period  Weeks    Status  New      PT SHORT TERM GOAL #3   Title  The patient will have improved flexion to 135 and abduction to 95 degrees needed for reaching, household chores and yardwork     Time  4    Period  Weeks    Status  New      PT SHORT TERM GOAL #4   Title  The patient will have improved internal rotation behind back to L4-5 region needed for dressing and toileting     Time  4    Period  Weeks    Status  New        PT Long Term Goals - 10/16/18 1135      PT LONG TERM GOAL #1   Title  The patient will be independent in HEP needed for further improvements in ROM and strength    Time  8    Period  Weeks    Status  New    Target Date  12/11/18      PT LONG TERM GOAL #2   Title  The patient will report a 50% improvement in shoulder pain with dressing, grooming and household activities    Time  8    Period  Weeks    Status  New      PT LONG TERM GOAL #3   Title  The patient will have improved UE elevation to 145 degrees needed for reaching overhead    Time  8    Period  Weeks    Status  New      PT LONG TERM GOAL #4   Title  Internal rotation behind the back to L2-3 needed for tucking in her shirt, toileting    Time  8    Period  Weeks    Status  New      PT LONG TERM GOAL #5   Title  Right shoulder/periscapular strength improved to grossly 3/5 to 3+/5 needed for household activities    Time  8    Period  Weeks    Status  New      Additional Long Term Goals   Additional Long Term Goals  Yes      PT LONG TERM GOAL #6   Title  FOTO functional outcome score improved from 58% limitation to 35%    Time  8    Period  Weeks    Status  New             Plan - 10/16/18 1116    Clinical Impression Statement  The patient reports the onset of right shoulder pain in September and is unsure of the cause.  She had an injection with minimal improvement.  In November she went paddle boarding which excacerbated the pain.  She reports she now has constant shoulder pain that varies in location on her shoulder.   She had a recent injection without relief.  She also had a recent x-ray which showed a subacute fracture of the superior humeral head.  She has been referred for a bone density test.  Last one 8 years ago which was OK.   The pain is exacerbated with movement especially with reaching and personal care like grooming and dressing.  Her sleep  is also affected by night pain.  Her right shoulder ROM is very limited in all planes:  flexion 128 degrees, abduction 87 degrees, external rotation 50 degrees and internal rotation behind the back to the sacrum.  Her strength is decreased in all planes particularly abduction 2+/5 and flexion 3-/5.  + drop arm test with painful lowering.  Tenderness over suprinatus muscle.  She would benefit from PT to address these deficits.      Personal Factors and Comorbidities  Age;Sex;Comorbidity 1;Comorbidity 2;Comorbidity 3+;Past/Current Experience    Comorbidities  celiacs disease which affects nutrient absorption and may affect bone density; subacute fracture;   history of left frozen shoulder;  age and gender increased risk for adhesive capsulitis     Examination-Activity Limitations  Hygiene/Grooming;Toileting;Dressing;Sleep;Reach Overhead;Carry;Lift    Examination-Participation Restrictions  Laundry;Cleaning;Meal Prep;Yard Work;Other    Stability/Clinical Decision Making  Evolving/Moderate complexity    Clinical Decision Making  Moderate    Rehab Potential  Good    PT Frequency  2x / week    PT Duration  8 weeks    PT Treatment/Interventions  ADLs/Self Care Home Management;Cryotherapy;Electrical Stimulation;Moist Heat;Iontophoresis 30m/ml Dexamethasone;Ultrasound;Therapeutic exercise;Therapeutic activities;Neuromuscular re-education;Patient/family education;Manual techniques;Dry needling;Taping    PT Next Visit Plan  review shoulder ROM ex's; try pulleys;  try prone extensions; DN shoulder and scapular muscles;  add isometrics deltoid    PT Home Exercise Plan   Access Code: [email or web address]     Consulted and Agree with Plan of Care  Patient       Patient will benefit from skilled therapeutic intervention in order to improve the following deficits and impairments:  Pain, Decreased activity tolerance, Decreased range of motion, Decreased strength, Impaired UE functional use  Visit Diagnosis: Chronic right  shoulder pain - Plan: PT plan of care cert/re-cert  Stiffness of right shoulder, not elsewhere classified - Plan: PT plan of care cert/re-cert  Muscle weakness (generalized) - Plan: PT plan of care cert/re-cert   The patient has been informed of current processes in place at Outpatient Rehab to protect patients from Covid-19 exposure including social distancing, schedule modifications, and new cleaning procedures. After discussing their particular risk with a therapist based on the patient's personal risk factors, the patient has decided to proceed with in-person therapy.  Problem List Patient Active Problem List   Diagnosis Date Noted  . Frozen shoulder 10/14/2018  . Gastroesophageal reflux disease without esophagitis 11/09/2015  . Belching 11/09/2015  . Loose stools 11/09/2015  . History of migraine headaches 01/09/2013  . Asthma, mild intermittent 01/31/2012  . GERD (gastroesophageal reflux disease) 01/31/2012  . Celiac disease 01/31/2012  . Hypothyroid 01/31/2012  . Panic disorder 01/31/2012   SRuben Im PT 10/16/18 11:41 AM Phone: [phone]: [phone] SAlvera Singh5/21/2020, 11:41 AM  [facility] [address] [place] [address]Phone: [phone]  Fax:  [phone] Name: Vicki LASTINGERMRN: [id number] of Birth: [birth year]

## 2018-10-22 ENCOUNTER — Other Ambulatory Visit: Payer: Self-pay

## 2018-10-22 DIAGNOSIS — H2513 Age-related nuclear cataract, bilateral: Secondary | ICD-10-CM | POA: Diagnosis not present

## 2018-10-22 DIAGNOSIS — H1045 Other chronic allergic conjunctivitis: Secondary | ICD-10-CM | POA: Diagnosis not present

## 2018-10-22 DIAGNOSIS — M2550 Pain in unspecified joint: Secondary | ICD-10-CM

## 2018-10-23 ENCOUNTER — Ambulatory Visit: Payer: BC Managed Care – PPO | Admitting: Physical Therapy

## 2018-10-23 ENCOUNTER — Encounter: Payer: Self-pay | Admitting: Physical Therapy

## 2018-10-23 ENCOUNTER — Other Ambulatory Visit (INDEPENDENT_AMBULATORY_CARE_PROVIDER_SITE_OTHER): Payer: BLUE CROSS/BLUE SHIELD

## 2018-10-23 ENCOUNTER — Other Ambulatory Visit: Payer: Self-pay

## 2018-10-23 DIAGNOSIS — G8929 Other chronic pain: Secondary | ICD-10-CM | POA: Diagnosis not present

## 2018-10-23 DIAGNOSIS — M25511 Pain in right shoulder: Secondary | ICD-10-CM | POA: Diagnosis not present

## 2018-10-23 DIAGNOSIS — M6281 Muscle weakness (generalized): Secondary | ICD-10-CM | POA: Diagnosis not present

## 2018-10-23 DIAGNOSIS — M25611 Stiffness of right shoulder, not elsewhere classified: Secondary | ICD-10-CM

## 2018-10-23 DIAGNOSIS — M2550 Pain in unspecified joint: Secondary | ICD-10-CM

## 2018-10-23 LAB — CBC WITH DIFFERENTIAL/PLATELET
Basophils Absolute: 0.1 10*3/uL (ref 0.0–0.1)
Basophils Relative: 1.1 % (ref 0.0–3.0)
Eosinophils Absolute: 0.4 10*3/uL (ref 0.0–0.7)
Eosinophils Relative: 7.7 % — ABNORMAL HIGH (ref 0.0–5.0)
HCT: 34.5 % — ABNORMAL LOW (ref 36.0–46.0)
Hemoglobin: 12 g/dL (ref 12.0–15.0)
Lymphocytes Relative: 28.3 % (ref 12.0–46.0)
Lymphs Abs: 1.4 10*3/uL (ref 0.7–4.0)
MCHC: 34.7 g/dL (ref 30.0–36.0)
MCV: 93.3 fl (ref 78.0–100.0)
Monocytes Absolute: 0.3 10*3/uL (ref 0.1–1.0)
Monocytes Relative: 6.9 % (ref 3.0–12.0)
Neutro Abs: 2.8 10*3/uL (ref 1.4–7.7)
Neutrophils Relative %: 56 % (ref 43.0–77.0)
Platelets: 185 10*3/uL (ref 150.0–400.0)
RBC: 3.7 Mil/uL — ABNORMAL LOW (ref 3.87–5.11)
RDW: 12.7 % (ref 11.5–15.5)
WBC: 5 10*3/uL (ref 4.0–10.5)

## 2018-10-23 LAB — COMPREHENSIVE METABOLIC PANEL
ALT: 27 U/L (ref 0–35)
AST: 30 U/L (ref 0–37)
Albumin: 4.2 g/dL (ref 3.5–5.2)
Alkaline Phosphatase: 48 U/L (ref 39–117)
BUN: 8 mg/dL (ref 6–23)
CO2: 30 mEq/L (ref 19–32)
Calcium: 9.5 mg/dL (ref 8.4–10.5)
Chloride: 104 mEq/L (ref 96–112)
Creatinine, Ser: 1.07 mg/dL (ref 0.40–1.20)
GFR: 52.44 mL/min — ABNORMAL LOW (ref >60.00)
Glucose, Bld: 116 mg/dL — ABNORMAL HIGH (ref 70–99)
Potassium: 3.7 mEq/L (ref 3.5–5.1)
Sodium: 141 mEq/L (ref 135–145)
Total Bilirubin: 0.4 mg/dL (ref 0.2–1.2)
Total Protein: 6.5 g/dL (ref 6.0–8.3)

## 2018-10-23 LAB — IBC PANEL
Iron: 84 ug/dL (ref 42–145)
Saturation Ratios: 28.7 % (ref 20.0–50.0)
Transferrin: 209 mg/dL — ABNORMAL LOW (ref 212.0–360.0)

## 2018-10-23 LAB — URIC ACID: Uric Acid, Serum: 4.1 mg/dL (ref 2.4–7.0)

## 2018-10-23 LAB — SEDIMENTATION RATE: Sed Rate: 7 mm/hr (ref 0–30)

## 2018-10-23 LAB — TSH: TSH: 2.5 u[IU]/mL (ref 0.35–4.50)

## 2018-10-23 LAB — FERRITIN: Ferritin: 65.2 ng/mL (ref 10.0–291.0)

## 2018-10-23 LAB — C-REACTIVE PROTEIN: CRP: <1 mg/dL (ref 0.5–20.0)

## 2018-10-23 NOTE — Therapy (Signed)
[facility] [address], [place], [place], [address] Phone: [phone]   Fax:  [phone]  Physical Therapy Treatment  Patient Details  Name: Vicki [name] MRN: [id number] Date of Birth: Apr 11, 1961 Referring Provider (PT): Dr. Charlann Boxer    Encounter Date: 10/23/2018  PT End of Session - 10/23/18 1157    Visit Number  2  (Pended)     Number of Visits  28  (Pended)     Date for PT Re-Evaluation  12/11/18  (Pended)     Authorization Type  BCBS--per Effie [employer]  2 used of 30 visits   (Pended)     PT Start Time  1100  (Pended)     PT Stop Time  1150  (Pended)     PT Time Calculation (min)  50 min  (Pended)     Activity Tolerance  Patient tolerated treatment well  (Pended)        Past Medical History:  Diagnosis Date  . Allergy   . Anxiety    hx ?panic disorder  . Asthma   . Celiac disease   . Chronic gastritis   . GERD (gastroesophageal reflux disease)   . Hyperplastic colon polyp   . Migraines    chronic  . Schatzki's ring   . Skin cancer    chin area  . Thyroid disease     Past Surgical History:  Procedure Laterality Date  . ABLATION  2012  . CESAREAN SECTION  1984  . COLONOSCOPY  2011  . DILATION AND CURETTAGE OF UTERUS  2012  . POLYPECTOMY    . TONSILLECTOMY AND ADENOIDECTOMY  1964  . TUBAL LIGATION  1998  . UPPER GASTROINTESTINAL ENDOSCOPY  [id number]    There were no vitals filed for this visit.  Subjective Assessment - 10/23/18 1104    Subjective  I'm sore all the time.  It spasms after exercise.      Currently in Pain?  Yes    Pain Score  2     Pain Location  Shoulder    Pain Orientation  Right         [facility] PT Assessment - 10/23/18 0001      AROM   Right Shoulder Flexion  148 Degrees    Right Shoulder ABduction  132 Degrees                   [facility] Adult PT Treatment/Exercise - 10/23/18 0001      Shoulder Exercises: Prone   Extension  AROM;Right;10 reps    Horizontal ABduction 1  AROM;Right;10 reps      Shoulder Exercises: Standing   Extension  Strengthening;Right;15 reps;Theraband    Theraband Level (Shoulder Extension)  Level 3 (Green)    Row  Strengthening;Right;15 reps;Theraband    Theraband Level (Shoulder Row)  Level 3 (Green)    Other Standing Exercises  UE Ranger L12 10x standing       Shoulder Exercises: Pulleys   Flexion  2 minutes      Moist Heat Therapy   Number Minutes Moist Heat  10 Minutes    Moist Heat Location  Shoulder      Manual Therapy   Soft tissue mobilization  right supraspinatus, infraspinatus, subscapularis, posterior deltoid       Trigger Point Dry Needling - 10/23/18 0001    Consent Given?  Yes    Education Handout Provided  Yes    Muscles Treated Upper Quadrant  Supraspinatus;Infraspinatus;Subscapularis;Deltoid;Teres major;Teres minor  Dry Needling Comments  right    Supraspinatus Response  Twitch response elicited;Palpable increased muscle length    Infraspinatus Response  Twitch response elicited;Palpable increased muscle length    Subscapularis Response  Twitch response elicited;Palpable increased muscle length    Deltoid Response  Twitch response elicited;Palpable increased muscle length    Teres major Response  Twitch response elicited;Palpable increased muscle length    Teres minor Response  Twitch response elicited;Palpable increased muscle length           PT Education - 10/23/18 1145    Education Details   Access Code: [email or web address]   dry needling after care;  green band row and extensions, prone extensions and small range horizontal abduction     Person(s) Educated  Patient    Methods  Explanation;Demonstration;Handout    Comprehension  Returned demonstration;Verbalized understanding       PT Short Term Goals - 10/16/18 1131      PT SHORT TERM GOAL #1   Title  The patient will demonstrate understand initial ROM ex HEP on a low load but frequent basis    Time  4    Period  Weeks     Status  New    Target Date  11/13/18      PT SHORT TERM GOAL #2   Title  The patient will report a 25% improvement in pain with dressing and grooming tasks    Time  4    Period  Weeks    Status  New      PT SHORT TERM GOAL #3   Title  The patient will have improved flexion to 135 and abduction to 95 degrees needed for reaching, household chores and yardwork     Time  4    Period  Weeks    Status  New      PT SHORT TERM GOAL #4   Title  The patient will have improved internal rotation behind back to L4-5 region needed for dressing and toileting    Time  4    Period  Weeks    Status  New        PT Long Term Goals - 10/16/18 1135      PT LONG TERM GOAL #1   Title  The patient will be independent in HEP needed for further improvements in ROM and strength    Time  8    Period  Weeks    Status  New    Target Date  12/11/18      PT LONG TERM GOAL #2   Title  The patient will report a 50% improvement in shoulder pain with dressing, grooming and household activities    Time  8    Period  Weeks    Status  New      PT LONG TERM GOAL #3   Title  The patient will have improved UE elevation to 145 degrees needed for reaching overhead    Time  8    Period  Weeks    Status  New      PT LONG TERM GOAL #4   Title  Internal rotation behind the back to L2-3 needed for tucking in her shirt, toileting    Time  8    Period  Weeks    Status  New      PT LONG TERM GOAL #5   Title  Right shoulder/periscapular strength improved to grossly 3/5 to 3+/5 needed for household activities  Time  8    Period  Weeks    Status  New      Additional Long Term Goals   Additional Long Term Goals  Yes      PT LONG TERM GOAL #6   Title  FOTO functional outcome score improved from 58% limitation to 35%    Time  8    Period  Weeks    Status  New            Plan - 10/23/18 1141    Clinical Impression Statement  Significant improvements in shoulder flexion and abduction ROM compared to  initial visit.  Functionally, she has been having more pain with shoulder mid range movements rather than endrange pain.   She has tender points in rotator cuff muscles.  Good response to manual therapy and dry needling with multiple twitch responses produced which is a good prognostic indicator.  Therapist closely monitoring response with all treatment interventions.      Comorbidities  celiacs disease which affects nutrient absorption and may affect bone density; subacute fracture;   history of left frozen shoulder;  age and gender increased risk for adhesive capsulitis     Examination-Activity Limitations  Hygiene/Grooming;Toileting;Dressing;Sleep;Reach Overhead;Carry;Lift    Examination-Participation Restrictions  Laundry;Cleaning;Meal Prep;Yard Work;Other    Rehab Potential  Good    PT Frequency  2x / week    PT Duration  8 weeks    PT Treatment/Interventions  ADLs/Self Care Home Management;Cryotherapy;Electrical Stimulation;Moist Heat;Iontophoresis 47m/ml Dexamethasone;Ultrasound;Therapeutic exercise;Therapeutic activities;Neuromuscular re-education;Patient/family education;Manual techniques;Dry needling;Taping    PT Next Visit Plan  assess response to DN #1;  review shoulder ROM ex's prone extensions; DN shoulder and scapular muscles;  rotation isometrics;  serratus punches     PT Home Exercise Plan   Access Code: [email or web address]        Patient will benefit from skilled therapeutic intervention in order to improve the following deficits and impairments:  Pain, Decreased activity tolerance, Decreased range of motion, Decreased strength, Impaired UE functional use  Visit Diagnosis: Chronic right shoulder pain  Stiffness of right shoulder, not elsewhere classified  Muscle weakness (generalized)     Problem List Patient Active Problem List   Diagnosis Date Noted  . Frozen shoulder 10/14/2018  . Gastroesophageal reflux disease without esophagitis 11/09/2015  . Belching 11/09/2015  . Loose  stools 11/09/2015  . History of migraine headaches 01/09/2013  . Asthma, mild intermittent 01/31/2012  . GERD (gastroesophageal reflux disease) 01/31/2012  . Celiac disease 01/31/2012  . Hypothyroid 01/31/2012  . Panic disorder 01/31/2012   SRuben Im PT 10/23/18 12:44 PM Phone: [phone]: [phone] SAlvera Singh5/28/2020, 12:44 PM  [facility] [address] [place] [address]Phone: [phone]  Fax:  [phone] Name: Vicki ALLAIREMRN: [id number] of Birth: [birth year]

## 2018-10-23 NOTE — Patient Instructions (Signed)
   Trigger Point Dry Needling  . What is Trigger Point Dry Needling (DN)? o DN is a physical therapy technique used to treat muscle pain and dysfunction. Specifically, DN helps deactivate muscle trigger points (muscle knots).  o A thin filiform needle is used to penetrate the skin and stimulate the underlying trigger point. The goal is for a local twitch response (LTR) to occur and for the trigger point to relax. No medication of any kind is injected during the procedure.   . What Does Trigger Point Dry Needling Feel Like?  o The procedure feels different for each individual patient. Some patients report that they do not actually feel the needle enter the skin and overall the process is not painful. Very mild bleeding may occur. However, many patients feel a deep cramping in the muscle in which the needle was inserted. This is the local twitch response.   Marland Kitchen How Will I feel after the treatment? o Soreness is normal, and the onset of soreness may not occur for a few hours. Typically this soreness does not last longer than two days.  o Bruising is uncommon, however; ice can be used to decrease any possible bruising.  o In rare cases feeling tired or nauseous after the treatment is normal. In addition, your symptoms may get worse before they get better, this period will typically not last longer than 24 hours.   . What Can I do After My Treatment? o Increase your hydration by drinking more water for the next 24 hours. o You may place ice or heat on the areas treated that have become sore, however, do not use heat on inflamed or bruised areas. Heat often brings more relief post needling. o You can continue your regular activities, but vigorous activity is not recommended initially after the treatment for 24 hours. o DN is best combined with other physical therapy such as strengthening, stretching, and other therapies.    Access Code: [email or web address]  URL: [email or web address]  Date:  10/23/2018  Prepared by: Ruben Im   Exercises  Seated Shoulder Flexion AAROM with Pulley Behind - 10 reps - 3 sets - 1x daily - 7x weekly  Seated Shoulder Flexion Towel Slide at Table Top - 10 reps - 3 sets - 1x daily - 7x weekly  Shoulder Mobilization [employer] - 10 reps - 3 sets - 1x daily - 7x weekly  Prone Shoulder Extension - Single Arm - 15-20 reps - 1 sets - 1x daily - 7x weekly  Prone Shoulder Horizontal Abduction - 15 reps - 1 sets - 1x daily - 7x weekly  Standing Row with Anchored Resistance - 15-20 reps - 1 sets - 1x daily - 7x weekly  Single Arm Shoulder Extension with Resistance - 15-20 reps - 1 sets - 1x daily - 7x weekly    [facility] [address], [place], [place] [address] Phone # [phone] Fax [phone]

## 2018-10-27 ENCOUNTER — Encounter: Payer: Self-pay | Admitting: Physical Therapy

## 2018-10-27 ENCOUNTER — Other Ambulatory Visit: Payer: Self-pay

## 2018-10-27 ENCOUNTER — Ambulatory Visit: Payer: BC Managed Care – PPO | Attending: Family Medicine | Admitting: Physical Therapy

## 2018-10-27 DIAGNOSIS — M25611 Stiffness of right shoulder, not elsewhere classified: Secondary | ICD-10-CM | POA: Diagnosis not present

## 2018-10-27 DIAGNOSIS — M6281 Muscle weakness (generalized): Secondary | ICD-10-CM

## 2018-10-27 DIAGNOSIS — G8929 Other chronic pain: Secondary | ICD-10-CM | POA: Insufficient documentation

## 2018-10-27 DIAGNOSIS — M25511 Pain in right shoulder: Secondary | ICD-10-CM | POA: Insufficient documentation

## 2018-10-27 LAB — ANA: Anti Nuclear Antibody (ANA): NEGATIVE

## 2018-10-27 LAB — VITAMIN D 1,25 DIHYDROXY
Vitamin D 1, 25 (OH)2 Total: 46 pg/mL (ref 18–72)
Vitamin D2 1, 25 (OH)2: 10 pg/mL
Vitamin D3 1, 25 (OH)2: 36 pg/mL

## 2018-10-27 LAB — CALCIUM, IONIZED: Calcium, Ion: 5.21 mg/dL (ref 4.8–5.6)

## 2018-10-27 LAB — PTH, INTACT AND CALCIUM
Calcium: 9.6 mg/dL (ref 8.6–10.4)
PTH: 25 pg/mL (ref 14–64)

## 2018-10-27 LAB — RHEUMATOID FACTOR: Rheumatoid fact SerPl-aCnc: <14 IU/mL (ref <14)

## 2018-10-27 LAB — ANGIOTENSIN CONVERTING ENZYME: Angiotensin-Converting Enzyme: 25 U/L (ref 9–67)

## 2018-10-27 LAB — CYCLIC CITRUL PEPTIDE ANTIBODY, IGG: Cyclic Citrullin Peptide Ab: <16 UNITS

## 2018-10-27 NOTE — Therapy (Signed)
[facility] [address], [place], [place], [address] Phone: [phone]   Fax:  [phone]  Physical Therapy Treatment  Patient Details  Name: Vicki [name] MRN: [id number] Date of Birth: Apr 09, 1961 Referring Provider (PT): Dr. Charlann Boxer    Encounter Date: 10/27/2018  PT End of Session - 10/27/18 1020    Visit Number  3    Number of Visits  28    Date for PT Re-Evaluation  12/11/18    Authorization Type  BCBS--per [place]  2 used of 30 visits     PT Start Time  0930    PT Stop Time  1015    PT Time Calculation (min)  45 min    Activity Tolerance  Patient tolerated treatment well       Past Medical History:  Diagnosis Date  . Allergy   . Anxiety    hx ?panic disorder  . Asthma   . Celiac disease   . Chronic gastritis   . GERD (gastroesophageal reflux disease)   . Hyperplastic colon polyp   . Migraines    chronic  . Schatzki's ring   . Skin cancer    chin area  . Thyroid disease     Past Surgical History:  Procedure Laterality Date  . ABLATION  2012  . CESAREAN SECTION  1984  . COLONOSCOPY  2011  . DILATION AND CURETTAGE OF UTERUS  2012  . POLYPECTOMY    . TONSILLECTOMY AND ADENOIDECTOMY  1964  . TUBAL LIGATION  1998  . UPPER GASTROINTESTINAL ENDOSCOPY  [id number]    There were no vitals filed for this visit.  Subjective Assessment - 10/27/18 0930    Subjective  I was really sore after the dry needling all weekend.  Lots of spasms in my arm.  Feeling less sore this morning.      Currently in Pain?  Yes    Pain Score  2     Pain Location  Shoulder    Pain Orientation  Right    Pain Type  Chronic pain                       [facility] Adult PT Treatment/Exercise - 10/27/18 0001      Shoulder Exercises: Supine   Protraction  Strengthening;Right;20 reps;Weights    Protraction Weight (lbs)  2    Flexion  --   12/6 2# 10x;  9/3 2# 10x     Shoulder Exercises: Prone   Other  Prone Exercises  bent kneel 2# rows, extensions 10x    Other Prone Exercises  bent kneel horizontal abduction       Shoulder Exercises: Standing   Protraction Limitations  wall push ups 10x    Extension  Strengthening;Right;15 reps;Theraband    Theraband Level (Shoulder Extension)  Level 3 (Green)    Row  Strengthening;Right;15 reps;Theraband    Theraband Level (Shoulder Row)  Level 3 (Green)    Shoulder Elevation Limitations  red ball roll up/down wall 6x    Other Standing Exercises  on mat table 10x    Other Standing Exercises  towel slide on railing       Manual Therapy   Kinesiotex  Facilitate Muscle      Kinesiotix   Facilitate Muscle   2 vertical strips anterior and posterior deltoid and 1 horizontal strip                PT Short Term Goals -  10/16/18 1131      PT SHORT TERM GOAL #1   Title  The patient will demonstrate understand initial ROM ex HEP on a low load but frequent basis    Time  4    Period  Weeks    Status  New    Target Date  11/13/18      PT SHORT TERM GOAL #2   Title  The patient will report a 25% improvement in pain with dressing and grooming tasks    Time  4    Period  Weeks    Status  New      PT SHORT TERM GOAL #3   Title  The patient will have improved flexion to 135 and abduction to 95 degrees needed for reaching, household chores and yardwork     Time  4    Period  Weeks    Status  New      PT SHORT TERM GOAL #4   Title  The patient will have improved internal rotation behind back to L4-5 region needed for dressing and toileting    Time  4    Period  Weeks    Status  New        PT Long Term Goals - 10/16/18 1135      PT LONG TERM GOAL #1   Title  The patient will be independent in HEP needed for further improvements in ROM and strength    Time  8    Period  Weeks    Status  New    Target Date  12/11/18      PT LONG TERM GOAL #2   Title  The patient will report a 50% improvement in shoulder pain with dressing, grooming  and household activities    Time  8    Period  Weeks    Status  New      PT LONG TERM GOAL #3   Title  The patient will have improved UE elevation to 145 degrees needed for reaching overhead    Time  8    Period  Weeks    Status  New      PT LONG TERM GOAL #4   Title  Internal rotation behind the back to L2-3 needed for tucking in her shirt, toileting    Time  8    Period  Weeks    Status  New      PT LONG TERM GOAL #5   Title  Right shoulder/periscapular strength improved to grossly 3/5 to 3+/5 needed for household activities    Time  8    Period  Weeks    Status  New      Additional Long Term Goals   Additional Long Term Goals  Yes      PT LONG TERM GOAL #6   Title  FOTO functional outcome score improved from 58% limitation to 35%    Time  8    Period  Weeks    Status  New            Plan - 10/27/18 1021    Clinical Impression Statement  The patient reports increased soreness and pain following dry needling.  Treatment focus today on ROM ex's in addition to scapular strengthening which she was able to do with pain level staying in the low range.  Verbal and tactile cues to decrease compensatory shoulder hike and to facilitate scapular retraction/depression.  Good initial response to kinesiotape.  Personal Factors and Comorbidities  Age;Sex;Comorbidity 1;Comorbidity 2;Comorbidity 3+;Past/Current Experience    Comorbidities  celiacs disease which affects nutrient absorption and may affect bone density; subacute fracture;   history of left frozen shoulder;  age and gender increased risk for adhesive capsulitis     Examination-Activity Limitations  Hygiene/Grooming;Toileting;Dressing;Sleep;Reach Overhead;Carry;Lift    Examination-Participation Restrictions  Laundry;Cleaning;Meal Prep;Yard Work;Other    Rehab Potential  Good    PT Frequency  2x / week    PT Duration  8 weeks    PT Treatment/Interventions  ADLs/Self Care Home Management;Cryotherapy;Electrical  Stimulation;Moist Heat;Iontophoresis 56m/ml Dexamethasone;Ultrasound;Therapeutic exercise;Therapeutic activities;Neuromuscular re-education;Patient/family education;Manual techniques;Dry needling;Taping    PT Next Visit Plan  assess response to KT;   scapular strengthening;   rotation isometrics;  serratus punches;  recheck shoulder ROM next visit    PT Home Exercise Plan   Access Code: [email or web address]        Patient will benefit from skilled therapeutic intervention in order to improve the following deficits and impairments:  Pain, Decreased activity tolerance, Decreased range of motion, Decreased strength, Impaired UE functional use  Visit Diagnosis: Chronic right shoulder pain  Stiffness of right shoulder, not elsewhere classified  Muscle weakness (generalized)     Problem List Patient Active Problem List   Diagnosis Date Noted  . Frozen shoulder 10/14/2018  . Gastroesophageal reflux disease without esophagitis 11/09/2015  . Belching 11/09/2015  . Loose stools 11/09/2015  . History of migraine headaches 01/09/2013  . Asthma, mild intermittent 01/31/2012  . GERD (gastroesophageal reflux disease) 01/31/2012  . Celiac disease 01/31/2012  . Hypothyroid 01/31/2012  . Panic disorder 01/31/2012   SRuben Im PT 10/27/18 10:28 AM Phone: [phone]: [phone] SAlvera Singh6/05/2018, 10:28 AM  [facility] [address] [place] [address]Phone: [phone]  Fax:  [phone] Name: MNYCHELLE CASSATAMRN: [id number] of Birth: [birth year]

## 2018-10-30 ENCOUNTER — Encounter: Payer: Self-pay | Admitting: Physical Therapy

## 2018-10-30 ENCOUNTER — Ambulatory Visit: Payer: BC Managed Care – PPO | Admitting: Physical Therapy

## 2018-10-30 ENCOUNTER — Other Ambulatory Visit: Payer: Self-pay

## 2018-10-30 DIAGNOSIS — G8929 Other chronic pain: Secondary | ICD-10-CM

## 2018-10-30 DIAGNOSIS — M25511 Pain in right shoulder: Secondary | ICD-10-CM | POA: Diagnosis not present

## 2018-10-30 DIAGNOSIS — M25611 Stiffness of right shoulder, not elsewhere classified: Secondary | ICD-10-CM

## 2018-10-30 DIAGNOSIS — M6281 Muscle weakness (generalized): Secondary | ICD-10-CM

## 2018-10-30 NOTE — Therapy (Signed)
[facility] [address], [place], [place], [address] Phone: [phone]   Fax:  [phone]  Physical Therapy Treatment  Patient Details  Name: Vicki [name] MRN: [id number] Date of Birth: [birth year] Referring Provider (PT): Dr. Charlann Boxer    Encounter Date: 10/30/2018  PT End of Session - 10/30/18 1124    Visit Number  4    Number of Visits  28    Date for PT Re-Evaluation  12/11/18    Authorization Type  BC[employer]--per [place]  2 used of 30 visits     PT Start Time  1030    PT Stop Time  1115    PT Time Calculation (min)  45 min    Activity Tolerance  Patient tolerated treatment well       Past Medical History:  Diagnosis Date  . Allergy   . Anxiety    hx ?panic disorder  . Asthma   . Celiac disease   . Chronic gastritis   . GERD (gastroesophageal reflux disease)   . Hyperplastic colon polyp   . Migraines    chronic  . Schatzki's ring   . Skin cancer    chin area  . Thyroid disease     Past Surgical History:  Procedure Laterality Date  . ABLATION  2012  . CESAREAN SECTION  1984  . COLONOSCOPY  2011  . DILATION AND CURETTAGE OF UTERUS  2012  . POLYPECTOMY    . TONSILLECTOMY AND ADENOIDECTOMY  1964  . TUBAL LIGATION  1998  . UPPER GASTROINTESTINAL ENDOSCOPY  [id number]    There were no vitals filed for this visit.  Subjective Assessment - 10/30/18 1029    Subjective  I had a tough night.  The KT was awesome for 48 hours.  I still get spasms that really hurt.  On Meloxicam and Tylenol.      Currently in Pain?  Yes    Pain Score  4     Pain Location  Shoulder    Pain Orientation  Right    Pain Type  Chronic pain         [facility] PT Assessment - 10/30/18 0001      AROM   Right Shoulder Flexion  135 Degrees    Right Shoulder ABduction  128 Degrees    Right Shoulder Internal Rotation  --   sacral region   Right Shoulder External Rotation  50 Degrees                   [facility]  Adult PT Treatment/Exercise - 10/30/18 0001      Shoulder Exercises: Supine   Protraction  Strengthening;Right;20 reps;Weights    Protraction Weight (lbs)  3    Flexion Limitations  2# A to Z at 90 degrees elevation       Shoulder Exercises: Sidelying   External Rotation  Strengthening;Right;20 reps;Weights    External Rotation Weight (lbs)  1   2nd set no weight      Shoulder Exercises: Standing   Other Standing Exercises  red band shoulder extension and extension diagonals D1/D2 10x each       Shoulder Exercises: Therapy Ball   Flexion  Both;10 reps      Shoulder Exercises: ROM/Strengthening   Ranger  on 2nd and 3rd steps 10x each     Wall Pushups Limitations  single arm push ups 20x    Other ROM/Strengthening Exercises  seated push ups on towel rolls 10x  Other ROM/Strengthening Exercises  small ball on stair railing 10x      Shoulder Exercises: Isometric Strengthening   Flexion  5X5"    Flexion Limitations  wall    Extension  5X5"    Extension Limitations  wall    ABduction  5X5"    ABduction Limitations  wall      Shoulder Exercises: Power [employer]  20 reps    Row Limitations  25# single arm       Iontophoresis   Type of Iontophoresis  Dexamethasone    Location  right subacromial shoulder    Dose  4 mg/ml     Time  4-6 hour patch              PT Education - 10/30/18 1111    Education Details  ionto instructions    Person(s) Educated  Patient    Methods  Explanation;Handout    Comprehension  Verbalized understanding       PT Short Term Goals - 10/30/18 1134      PT SHORT TERM GOAL #1   Title  The patient will demonstrate understand initial ROM ex HEP on a low load but frequent basis    Time  4    Status  On-going    Target Date  11/13/18      PT SHORT TERM GOAL #2   Title  The patient will report a 25% improvement in pain with dressing and grooming tasks    Time  4    Period  Weeks    Status  On-going      PT SHORT TERM GOAL #3   Title   The patient will have improved flexion to 135 and abduction to 95 degrees needed for reaching, household chores and yardwork     Time  4    Period  Weeks    Status  On-going      PT SHORT TERM GOAL #4   Title  The patient will have improved internal rotation behind back to L4-5 region needed for dressing and toileting    Time  4    Period  Weeks    Status  On-going        PT Long Term Goals - 10/16/18 1135      PT LONG TERM GOAL #1   Title  The patient will be independent in HEP needed for further improvements in ROM and strength    Time  8    Period  Weeks    Status  New    Target Date  12/11/18      PT LONG TERM GOAL #2   Title  The patient will report a 50% improvement in shoulder pain with dressing, grooming and household activities    Time  8    Period  Weeks    Status  New      PT LONG TERM GOAL #3   Title  The patient will have improved UE elevation to 145 degrees needed for reaching overhead    Time  8    Period  Weeks    Status  New      PT LONG TERM GOAL #4   Title  Internal rotation behind the back to L2-3 needed for tucking in her shirt, toileting    Time  8    Period  Weeks    Status  New      PT LONG TERM GOAL #5   Title  Right shoulder/periscapular  strength improved to grossly 3/5 to 3+/5 needed for household activities    Time  8    Period  Weeks    Status  New      Additional Long Term Goals   Additional Long Term Goals  Yes      PT LONG TERM GOAL #6   Title  FOTO functional outcome score improved from 58% limitation to 35%    Time  8    Period  Weeks    Status  New            Plan - 10/30/18 1130    Clinical Impression Statement  Patient continues to report continued constant pain that wakes her up at night.  Her ROM is better than on initial eval but decreased compared to  last week.  Initiated ionto for pain control and to decrease inflammation.  Therapist closely monitoring response and modifying as needed for pain.       Comorbidities  celiacs disease which affects nutrient absorption and may affect bone density; subacute fracture;   history of left frozen shoulder;  age and gender increased risk for adhesive capsulitis     Examination-Activity Limitations  Hygiene/Grooming;Toileting;Dressing;Sleep;Reach Overhead;Carry;Lift    Examination-Participation Restrictions  Laundry;Cleaning;Meal Prep;Yard Work;Other    Rehab Potential  Good    PT Frequency  2x / week    PT Duration  8 weeks    PT Treatment/Interventions  ADLs/Self Care Home Management;Cryotherapy;Electrical Stimulation;Moist Heat;Iontophoresis 17m/ml Dexamethasone;Ultrasound;Therapeutic exercise;Therapeutic activities;Neuromuscular re-education;Patient/family education;Manual techniques;Dry needling;Taping    PT Next Visit Plan  assess response to ionto;  restart KT as needed;    scapular strengthening;   rotation isometrics;  serratus punches; deltoid strengthening;  UE Ranger on wall     PT Home Exercise Plan   Access Code: [email or web address]        Patient will benefit from skilled therapeutic intervention in order to improve the following deficits and impairments:  Pain, Decreased activity tolerance, Decreased range of motion, Decreased strength, Impaired UE functional use  Visit Diagnosis: Chronic right shoulder pain  Stiffness of right shoulder, not elsewhere classified  Muscle weakness (generalized)     Problem List Patient Active Problem List   Diagnosis Date Noted  . Frozen shoulder 10/14/2018  . Gastroesophageal reflux disease without esophagitis 11/09/2015  . Belching 11/09/2015  . Loose stools 11/09/2015  . History of migraine headaches 01/09/2013  . Asthma, mild intermittent 01/31/2012  . GERD (gastroesophageal reflux disease) 01/31/2012  . Celiac disease 01/31/2012  . Hypothyroid 01/31/2012  . Panic disorder 01/31/2012   SRuben Im PT 10/30/18 11:36 AM Phone: [phone]: [phone] SAlvera Singh6/08/2018, 11:35  AM  [facility] [address] [place] [address]Phone: [phone]  Fax:  [phone] Name: MFOLASADE MOOTYMRN: [id number] of Birth: [birth year]

## 2018-10-30 NOTE — Patient Instructions (Signed)

## 2018-11-03 ENCOUNTER — Encounter: Payer: BLUE CROSS/BLUE SHIELD | Admitting: Physical Therapy

## 2018-11-04 ENCOUNTER — Ambulatory Visit: Payer: BC Managed Care – PPO | Admitting: Physical Therapy

## 2018-11-04 ENCOUNTER — Other Ambulatory Visit: Payer: Self-pay

## 2018-11-04 ENCOUNTER — Encounter: Payer: Self-pay | Admitting: Physical Therapy

## 2018-11-04 ENCOUNTER — Encounter: Payer: Self-pay | Admitting: Family Medicine

## 2018-11-04 DIAGNOSIS — M6281 Muscle weakness (generalized): Secondary | ICD-10-CM

## 2018-11-04 DIAGNOSIS — G8929 Other chronic pain: Secondary | ICD-10-CM

## 2018-11-04 DIAGNOSIS — M25511 Pain in right shoulder: Secondary | ICD-10-CM | POA: Diagnosis not present

## 2018-11-04 DIAGNOSIS — M25611 Stiffness of right shoulder, not elsewhere classified: Secondary | ICD-10-CM

## 2018-11-04 NOTE — Therapy (Signed)
[facility] [address], [place], [place], [address] Phone: [phone]   Fax:  [phone]  Physical Therapy Treatment  Patient Details  Name: Vicki [name] MRN: [id number] Date of Birth: Sep 18, 1960 Referring Provider (PT): Dr. Charlann Boxer    Encounter Date: 11/04/2018  PT End of Session - 11/04/18 1217    Visit Number  5    Number of Visits  28    Date for PT Re-Evaluation  12/11/18    Authorization Type  [employer]--per [place]  2 used of 30 visits     PT Start Time  0930    PT Stop Time  1015    PT Time Calculation (min)  45 min    Activity Tolerance  Patient tolerated treatment well       Past Medical History:  Diagnosis Date  . Allergy   . Anxiety    hx ?panic disorder  . Asthma   . Celiac disease   . Chronic gastritis   . GERD (gastroesophageal reflux disease)   . Hyperplastic colon polyp   . Migraines    chronic  . Schatzki's ring   . Skin cancer    chin area  . Thyroid disease     Past Surgical History:  Procedure Laterality Date  . ABLATION  2012  . CESAREAN SECTION  1984  . COLONOSCOPY  2011  . DILATION AND CURETTAGE OF UTERUS  2012  . POLYPECTOMY    . TONSILLECTOMY AND ADENOIDECTOMY  1964  . TUBAL LIGATION  1998  . UPPER GASTROINTESTINAL ENDOSCOPY  [id number]    There were no vitals filed for this visit.  Subjective Assessment - 11/04/18 0931    Subjective  The weekend went great.  I think the ionto patch helped.   But I did some stretches and I got it cranky again.      Currently in Pain?  Yes    Pain Score  4     Pain Location  Shoulder    Pain Orientation  Right                       [facility] Adult PT Treatment/Exercise - 11/04/18 0001      Shoulder Exercises: Supine   Protraction  Strengthening;Right;20 reps;Weights    Protraction Weight (lbs)  3      Shoulder Exercises: Sidelying   External Rotation  Strengthening;Right;20 reps;Weights    External Rotation  Weight (lbs)  1   small arc   Other Sidelying Exercises  attempted sidelying abduction but too painful       Shoulder Exercises: ROM/Strengthening   Ranger  on 2nd and 3rd steps 10x each     Wall Pushups  20 reps    Wall Pushups Limitations  straight and rotated hands      Shoulder Exercises: Isometric Strengthening   Flexion  5X5"    Flexion Limitations  ball on wall     Extension  5X5"    Extension Limitations  ball on wall     External Rotation  5X10"    External Rotation Limitations  isometrically with green band    Internal Rotation  5X10"    Internal Rotation Limitations  isometrically with green band    ABduction  5X5"    ABduction Limitations  ball on wall       Shoulder Exercises: Power Warden/ranger Exercises  seated 15# bil row 20x  Iontophoresis   Type of Iontophoresis  Dexamethasone    Location  right subacromial shoulder    Dose  4 mg/ml     Time  4-6 hour patch                PT Short Term Goals - 10/30/18 1134      PT SHORT TERM GOAL #1   Title  The patient will demonstrate understand initial ROM ex HEP on a low load but frequent basis    Time  4    Status  On-going    Target Date  11/13/18      PT SHORT TERM GOAL #2   Title  The patient will report a 25% improvement in pain with dressing and grooming tasks    Time  4    Period  Weeks    Status  On-going      PT SHORT TERM GOAL #3   Title  The patient will have improved flexion to 135 and abduction to 95 degrees needed for reaching, household chores and yardwork     Time  4    Period  Weeks    Status  On-going      PT SHORT TERM GOAL #4   Title  The patient will have improved internal rotation behind back to L4-5 region needed for dressing and toileting    Time  4    Period  Weeks    Status  On-going        PT Long Term Goals - 10/16/18 1135      PT LONG TERM GOAL #1   Title  The patient will be independent in HEP needed for further improvements in ROM and  strength    Time  8    Period  Weeks    Status  New    Target Date  12/11/18      PT LONG TERM GOAL #2   Title  The patient will report a 50% improvement in shoulder pain with dressing, grooming and household activities    Time  8    Period  Weeks    Status  New      PT LONG TERM GOAL #3   Title  The patient will have improved UE elevation to 145 degrees needed for reaching overhead    Time  8    Period  Weeks    Status  New      PT LONG TERM GOAL #4   Title  Internal rotation behind the back to L2-3 needed for tucking in her shirt, toileting    Time  8    Period  Weeks    Status  New      PT LONG TERM GOAL #5   Title  Right shoulder/periscapular strength improved to grossly 3/5 to 3+/5 needed for household activities    Time  8    Period  Weeks    Status  New      Additional Long Term Goals   Additional Long Term Goals  Yes      PT LONG TERM GOAL #6   Title  FOTO functional outcome score improved from 58% limitation to 35%    Time  8    Period  Weeks    Status  New            Plan - 11/04/18 1218    Clinical Impression Statement  The patient continues to be highly compliant with her HEP however her symptoms are  easily aggravated.  She continues to have night pain, pain with abduction, horizontal adduction and endrange internal and external rotation.  +Drop arm test and Michel Bickers tests.  Pain is midrange rather than endrange of motion.   Short term relief with iontophoresis.  We have modified her exercises to include mostly supportive muscle strengthening (deltoids and periscapular exs) and avoiding rotator cuff strengthening secondary to increased pain.  She is highly frustrated by her continued pain and we have discussed that follow up with her doctor may be helpful to determine if imaging would helpful.      Rehab Potential  Good    PT Frequency  2x / week    PT Duration  8 weeks    PT Next Visit Plan  continue ionto;  restart KT as needed;    scapular  strengthening;   rotation isometrics;  serratus punches; deltoid strengthening;  UE Ranger on wall     PT Home Exercise Plan   Access Code: [email or web address]        Patient will benefit from skilled therapeutic intervention in order to improve the following deficits and impairments:  Pain, Decreased activity tolerance, Decreased range of motion, Decreased strength, Impaired UE functional use  Visit Diagnosis: Chronic right shoulder pain  Stiffness of right shoulder, not elsewhere classified  Muscle weakness (generalized)     Problem List Patient Active Problem List   Diagnosis Date Noted  . Frozen shoulder 10/14/2018  . Gastroesophageal reflux disease without esophagitis 11/09/2015  . Belching 11/09/2015  . Loose stools 11/09/2015  . History of migraine headaches 01/09/2013  . Asthma, mild intermittent 01/31/2012  . GERD (gastroesophageal reflux disease) 01/31/2012  . Celiac disease 01/31/2012  . Hypothyroid 01/31/2012  . Panic disorder 01/31/2012   Ruben Im, PT 11/04/18 12:28 PM Phone: [phone] Fax: [phone]  Alvera Singh 11/04/2018, 12:27 PM  [facility] [address] [place], [place], [address] Phone: [phone]   Fax:  [phone]  Name: Vicki [name] MRN: [id number] Date of Birth: Feb 03, 1961

## 2018-11-05 ENCOUNTER — Other Ambulatory Visit: Payer: Self-pay

## 2018-11-05 ENCOUNTER — Other Ambulatory Visit: Payer: Self-pay | Admitting: Family Medicine

## 2018-11-05 DIAGNOSIS — G8929 Other chronic pain: Secondary | ICD-10-CM

## 2018-11-06 ENCOUNTER — Other Ambulatory Visit: Payer: Self-pay

## 2018-11-06 ENCOUNTER — Encounter: Payer: Self-pay | Admitting: Physical Therapy

## 2018-11-06 ENCOUNTER — Ambulatory Visit: Payer: BC Managed Care – PPO | Admitting: Physical Therapy

## 2018-11-06 DIAGNOSIS — M25611 Stiffness of right shoulder, not elsewhere classified: Secondary | ICD-10-CM | POA: Diagnosis not present

## 2018-11-06 DIAGNOSIS — G8929 Other chronic pain: Secondary | ICD-10-CM | POA: Diagnosis not present

## 2018-11-06 DIAGNOSIS — M25511 Pain in right shoulder: Secondary | ICD-10-CM | POA: Diagnosis not present

## 2018-11-06 DIAGNOSIS — M6281 Muscle weakness (generalized): Secondary | ICD-10-CM | POA: Diagnosis not present

## 2018-11-06 NOTE — Therapy (Signed)
[facility] [address] [place], [place], [address] Phone: [phone]   Fax:  [phone]  Physical Therapy Treatment  Patient Details  Name: Vicki [name] MRN: [id number] Date of Birth: 1961/01/29 Referring Provider (PT): Dr. Charlann Boxer    Encounter Date: 11/06/2018  PT End of Session - 11/06/18 1122    Visit Number  6    Number of Visits  28    Authorization Type  [employer]--per [place]  2 used of 30 visits     PT Start Time  1031    PT Stop Time  1117    PT Time Calculation (min)  46 min    Activity Tolerance  Patient tolerated treatment well       Past Medical History:  Diagnosis Date  . Allergy   . Anxiety    hx ?panic disorder  . Asthma   . Celiac disease   . Chronic gastritis   . GERD (gastroesophageal reflux disease)   . Hyperplastic colon polyp   . Migraines    chronic  . Schatzki's ring   . Skin cancer    chin area  . Thyroid disease     Past Surgical History:  Procedure Laterality Date  . ABLATION  2012  . CESAREAN SECTION  1984  . COLONOSCOPY  2011  . DILATION AND CURETTAGE OF UTERUS  2012  . POLYPECTOMY    . TONSILLECTOMY AND ADENOIDECTOMY  1964  . TUBAL LIGATION  1998  . UPPER GASTROINTESTINAL ENDOSCOPY  [id number]    There were no vitals filed for this visit.  Subjective Assessment - 11/06/18 1031    Subjective  I feel the same.  Bone density on 6/26 and MRI 6/27. I have a couple good nights after the ionto patch.    Pertinent History  celiac disease which affects nutrient absorption;  hx of left frozen shoulder 10-12 years ago took a long time to resolve attributed to flu shot, had PT and 2 rounds of dose pack     Diagnostic tests  xray:  subtle fracture superior humeral head with a subacute appearance;  referred for bone density     Patient Stated Goals  be able to sleep the night without waking in pain;  not have pain all the time; both sides equal ROM     Currently in Pain?   Yes    Pain Score  4     Pain Location  Shoulder    Pain Orientation  Right    Pain Type  Chronic pain         [facility] PT Assessment - 11/06/18 0001      AROM   Right Shoulder Flexion  115 Degrees    Right Shoulder Internal Rotation  --   sacral region   Right Shoulder External Rotation  55 Degrees                   [facility] Adult PT Treatment/Exercise - 11/06/18 0001      Shoulder Exercises: Supine   Protraction  Strengthening;Right;20 reps;Weights    Protraction Weight (lbs)  4      Shoulder Exercises: Prone   Retraction  Strengthening;Right;10 reps;Weights    Retraction Weight (lbs)  2    Retraction Limitations  quadruped row    Extension  Right;10 reps;Weights    Extension Weight (lbs)  2    Extension Limitations  quadruped    Other Prone Exercises  quadruped weight shift and LE  lifts     Other Prone Exercises  quadruped tricep extensions 10x       Shoulder Exercises: Sidelying   External Rotation  Strengthening;Right;10 reps;Weights    External Rotation Weight (lbs)  2    Flexion  AROM;Right;10 reps   UE Ranger      Shoulder Exercises: ROM/Strengthening   Ranger  on 2nd and 3rd steps 10x each     Wall Pushups  20 reps    Wall Pushups Limitations  with red ball     Ball on Wall  up/down and side to side     Rhythmic Stabilization, Seated  steering wheel motion with small blue ball 10x    Other ROM/Strengthening Exercises  large red ball roll on railing     Other ROM/Strengthening Exercises  large red ball on wall 10x       Shoulder Exercises: Isometric Strengthening   External Rotation  5X5"    External Rotation Limitations  doorway    Internal Rotation  5X5"    Internal Rotation Limitations  doorway       Iontophoresis   Type of Iontophoresis  Dexamethasone    Location  right subacromial shoulder #3    Dose  4 mg/ml     Time  4-6 hour patch                PT Short Term Goals - 10/30/18 1134      PT SHORT TERM GOAL #1   Title  The  patient will demonstrate understand initial ROM ex HEP on a low load but frequent basis    Time  4    Status  On-going    Target Date  11/13/18      PT SHORT TERM GOAL #2   Title  The patient will report a 25% improvement in pain with dressing and grooming tasks    Time  4    Period  Weeks    Status  On-going      PT SHORT TERM GOAL #3   Title  The patient will have improved flexion to 135 and abduction to 95 degrees needed for reaching, household chores and yardwork     Time  4    Period  Weeks    Status  On-going      PT SHORT TERM GOAL #4   Title  The patient will have improved internal rotation behind back to L4-5 region needed for dressing and toileting    Time  4    Period  Weeks    Status  On-going        PT Long Term Goals - 10/16/18 1135      PT LONG TERM GOAL #1   Title  The patient will be independent in HEP needed for further improvements in ROM and strength    Time  8    Period  Weeks    Status  New    Target Date  12/11/18      PT LONG TERM GOAL #2   Title  The patient will report a 50% improvement in shoulder pain with dressing, grooming and household activities    Time  8    Period  Weeks    Status  New      PT LONG TERM GOAL #3   Title  The patient will have improved UE elevation to 145 degrees needed for reaching overhead    Time  8    Period  Weeks  Status  New      PT LONG TERM GOAL #4   Title  Internal rotation behind the back to L2-3 needed for tucking in her shirt, toileting    Time  8    Period  Weeks    Status  New      PT LONG TERM GOAL #5   Title  Right shoulder/periscapular strength improved to grossly 3/5 to 3+/5 needed for household activities    Time  8    Period  Weeks    Status  New      Additional Long Term Goals   Additional Long Term Goals  Yes      PT LONG TERM GOAL #6   Title  FOTO functional outcome score improved from 58% limitation to 35%    Time  8    Period  Weeks    Status  New            Plan -  11/06/18 1122    Clinical Impression Statement  The patient is improving with periscapular strength and deltoid strength however flexion, abduction and internal ROM continues to be very painful and limited.  She has improved pain/sleep for 1-2 days however the pain returns to baseline level.  She continues to be very compliant with HEP and will plan to decrease treatment frequency to 1x/week.    Comorbidities  celiacs disease which affects nutrient absorption and may affect bone density; subacute fracture;   history of left frozen shoulder;  age and gender increased risk for adhesive capsulitis     Examination-Participation Restrictions  Laundry;Cleaning;Meal Prep;Yard Work;Other    Stability/Clinical Decision Making  Evolving/Moderate complexity    Rehab Potential  Good    PT Frequency  2x / week    PT Treatment/Interventions  ADLs/Self Care Home Management;Cryotherapy;Electrical Stimulation;Moist Heat;Iontophoresis 9m/ml Dexamethasone;Ultrasound;Therapeutic exercise;Therapeutic activities;Neuromuscular re-education;Patient/family education;Manual techniques;Dry needling;Taping    PT Next Visit Plan  do FOTO and check STGS next week;  continue ionto;  restart KT as needed;    scapular strengthening;   rotation isometrics;  serratus punches; deltoid strengthening;  UE Ranger on wall;  may decrease treatment frequency to 1x/week    PT Home Exercise Plan   Access Code: [email or web address]        Patient will benefit from skilled therapeutic intervention in order to improve the following deficits and impairments:  Pain, Decreased activity tolerance, Decreased range of motion, Decreased strength, Impaired UE functional use  Visit Diagnosis: Chronic right shoulder pain  Stiffness of right shoulder, not elsewhere classified  Muscle weakness (generalized)     Problem List Patient Active Problem List   Diagnosis Date Noted  . Frozen shoulder 10/14/2018  . Gastroesophageal reflux disease without  esophagitis 11/09/2015  . Belching 11/09/2015  . Loose stools 11/09/2015  . History of migraine headaches 01/09/2013  . Asthma, mild intermittent 01/31/2012  . GERD (gastroesophageal reflux disease) 01/31/2012  . Celiac disease 01/31/2012  . Hypothyroid 01/31/2012  . Panic disorder 01/31/2012   SRuben Im PT 11/06/18 11:28 AM Phone: [phone]: [phone] [name], 11:28 AM  [facility] [address] [place] [address]Phone: [phone]  Fax:  [phone] Name: MLAVREN LEWANMRN: [id number] of Birth: 2June 09, 1962

## 2018-11-10 ENCOUNTER — Ambulatory Visit: Payer: BC Managed Care – PPO | Admitting: Physical Therapy

## 2018-11-13 ENCOUNTER — Encounter: Payer: Self-pay | Admitting: Physical Therapy

## 2018-11-13 ENCOUNTER — Ambulatory Visit: Payer: BC Managed Care – PPO | Admitting: Physical Therapy

## 2018-11-13 ENCOUNTER — Other Ambulatory Visit: Payer: Self-pay

## 2018-11-13 DIAGNOSIS — M25611 Stiffness of right shoulder, not elsewhere classified: Secondary | ICD-10-CM | POA: Diagnosis not present

## 2018-11-13 DIAGNOSIS — G8929 Other chronic pain: Secondary | ICD-10-CM

## 2018-11-13 DIAGNOSIS — M25511 Pain in right shoulder: Secondary | ICD-10-CM | POA: Diagnosis not present

## 2018-11-13 DIAGNOSIS — M6281 Muscle weakness (generalized): Secondary | ICD-10-CM | POA: Diagnosis not present

## 2018-11-13 NOTE — Therapy (Signed)
[facility] [address] [place], [place], [address] Phone: [phone]   Fax:  [phone]  Physical Therapy Treatment  Patient Details  Name: Vicki [name] MRN: [id number] Date of Birth: 09/15/1960 Referring Provider (PT): Dr. Charlann Boxer    Encounter Date: 11/13/2018  PT End of Session - 11/13/18 1520    Visit Number  7    Number of Visits  28    Date for PT Re-Evaluation  12/11/18    Authorization Type  [employer]--per [place]  2 used of 30 visits     PT Start Time  1430    PT Stop Time  1510    PT Time Calculation (min)  40 min    Activity Tolerance  Patient tolerated treatment well       Past Medical History:  Diagnosis Date  . Allergy   . Anxiety    hx ?panic disorder  . Asthma   . Celiac disease   . Chronic gastritis   . GERD (gastroesophageal reflux disease)   . Hyperplastic colon polyp   . Migraines    chronic  . Schatzki's ring   . Skin cancer    chin area  . Thyroid disease     Past Surgical History:  Procedure Laterality Date  . ABLATION  2012  . CESAREAN SECTION  1984  . COLONOSCOPY  2011  . DILATION AND CURETTAGE OF UTERUS  2012  . POLYPECTOMY    . TONSILLECTOMY AND ADENOIDECTOMY  1964  . TUBAL LIGATION  1998  . UPPER GASTROINTESTINAL ENDOSCOPY  [id number]    There were no vitals filed for this visit.  Subjective Assessment - 11/13/18 1429    Subjective  Doing about the same.  Everything is about the same.  I'm doing the ex's at home.  I tried not taking the Meloxicam but I missed it and had it refilled.   I really felt the battery of the ionto patch for about an hour.    Pertinent History  celiac disease which affects nutrient absorption;  hx of left frozen shoulder 10-12 years ago took a long time to resolve attributed to flu shot, had PT and 2 rounds of dose pack     Diagnostic tests  xray:  subtle fracture superior humeral head with a subacute appearance;  referred for bone density      Patient Stated Goals  be able to sleep the night without waking in pain;  not have pain all the time; both sides equal ROM     Currently in Pain?  Yes    Pain Score  4     Pain Location  Shoulder    Pain Orientation  Right    Pain Type  Chronic pain    Aggravating Factors   night time ache    Pain Relieving Factors  Meloxicam                       [facility] Adult PT Treatment/Exercise - 11/13/18 0001      Shoulder Exercises: Supine   Protraction  Strengthening;Right;20 reps;Weights    Protraction Weight (lbs)  3    Other Supine Exercises  3# small arc 12/6 and 3/9  20x each      Shoulder Exercises: Prone   Extension  Right;10 reps;Weights    Extension Weight (lbs)  2    Horizontal ABduction 1  AROM;Right;10 reps    Horizontal ABduction 1 Weight (lbs)  0  Shoulder Exercises: Standing   Shoulder Elevation Limitations  biceps 3# 20x; triceps green band 20x    Other Standing Exercises  yellow band steering wheels 10x    Other Standing Exercises  yellow band wall walks 10x       Shoulder Exercises: ROM/Strengthening   Ranger  on 2nd and 3rd steps 10x each     Wall Pushups  20 reps    Other ROM/Strengthening Exercises  yellow band steering wheel 10x    Other ROM/Strengthening Exercises  yellow band wall walks 10x      Shoulder Exercises: Isometric Strengthening   Flexion  5X5"    Flexion Limitations  wall    Extension  5X5"    Extension Limitations  wall    External Rotation  5X5"    External Rotation Limitations  doorway    Internal Rotation  5X5"    Internal Rotation Limitations  doorway     ABduction  5X5"    ABduction Limitations  wall      Iontophoresis   Type of Iontophoresis  Dexamethasone    Location  right subacromial shoulder    Dose  4 mg/ml     Time  4-6 hour patch                PT Short Term Goals - 11/13/18 1527      PT SHORT TERM GOAL #1   Title  The patient will demonstrate understand initial ROM ex HEP on a low load but  frequent basis    Status  Achieved      PT SHORT TERM GOAL #2   Title  The patient will report a 25% improvement in pain with dressing and grooming tasks    Time  4    Period  Weeks    Status  On-going      PT SHORT TERM GOAL #3   Title  The patient will have improved flexion to 135 and abduction to 95 degrees needed for reaching, household chores and yardwork     Time  4    Period  Weeks    Status  On-going      PT SHORT TERM GOAL #4   Title  The patient will have improved internal rotation behind back to L4-5 region needed for dressing and toileting    Time  4    Period  Weeks    Status  On-going        PT Long Term Goals - 10/16/18 1135      PT LONG TERM GOAL #1   Title  The patient will be independent in HEP needed for further improvements in ROM and strength    Time  8    Period  Weeks    Status  New    Target Date  12/11/18      PT LONG TERM GOAL #2   Title  The patient will report a 50% improvement in shoulder pain with dressing, grooming and household activities    Time  8    Period  Weeks    Status  New      PT LONG TERM GOAL #3   Title  The patient will have improved UE elevation to 145 degrees needed for reaching overhead    Time  8    Period  Weeks    Status  New      PT LONG TERM GOAL #4   Title  Internal rotation behind the back to L2-3 needed for  tucking in her shirt, toileting    Time  8    Period  Weeks    Status  New      PT LONG TERM GOAL #5   Title  Right shoulder/periscapular strength improved to grossly 3/5 to 3+/5 needed for household activities    Time  8    Period  Weeks    Status  New      Additional Long Term Goals   Additional Long Term Goals  Yes      PT LONG TERM GOAL #6   Title  FOTO functional outcome score improved from 58% limitation to 35%    Time  8    Period  Weeks    Status  New            Plan - 11/13/18 1520    Clinical Impression Statement  The patient continues to report continued right shoulder pain  at night, achiness throughout the day and painful with elevation ROM.  Treatment focus on establishing a basic HEP on right upper quarter strengthening that is non-painful.  She was able to perform all the ex's today without an increase in pain level.  She will hold on cane ex's and pulleys for now secondary to increased pain.  Therapist modifying grip secondary to reports of elbow and thumb pain this week.   She plans to have an MRI in 10 days that may help her with decision making with continued treatment.    Comorbidities  celiacs disease which affects nutrient absorption and may affect bone density; subacute fracture;   history of left frozen shoulder;  age and gender increased risk for adhesive capsulitis     Rehab Potential  Good    PT Frequency  2x / week    PT Duration  8 weeks    PT Treatment/Interventions  ADLs/Self Care Home Management;Cryotherapy;Electrical Stimulation;Moist Heat;Iontophoresis 37m/ml Dexamethasone;Ultrasound;Therapeutic exercise;Therapeutic activities;Neuromuscular re-education;Patient/family education;Manual techniques;Dry needling;Taping    PT Next Visit Plan  follow up in 1 week;  check FOTO;  % improvement;  upper quarter strengthening/scapular within pain limits;  ionto;  hold on shoulder elevation > 90 degrees    PT Home Exercise Plan   Access Code: [email or web address]        Patient will benefit from skilled therapeutic intervention in order to improve the following deficits and impairments:  Pain, Decreased activity tolerance, Decreased range of motion, Decreased strength, Impaired UE functional use  Visit Diagnosis: 1. Chronic right shoulder pain   2. Stiffness of right shoulder, not elsewhere classified   3. Muscle weakness (generalized)        Problem List Patient Active Problem List   Diagnosis Date Noted  . Frozen shoulder 10/14/2018  . Gastroesophageal reflux disease without esophagitis 11/09/2015  . Belching 11/09/2015  . Loose stools 11/09/2015  .  History of migraine headaches 01/09/2013  . Asthma, mild intermittent 01/31/2012  . GERD (gastroesophageal reflux disease) 01/31/2012  . Celiac disease 01/31/2012  . Hypothyroid 01/31/2012  . Panic disorder 01/31/2012   SRuben Im PT 11/13/18 3:29 PM Phone: [phone]: [phone] Singh6/18/2020, 3:29 PM  [facility] [address] [place] [address]Phone: [phone]  Fax:  [phone] Name: Vicki FALKNERMRN: [id number] of Birth: [birth year]

## 2018-11-14 DIAGNOSIS — Z1212 Encounter for screening for malignant neoplasm of rectum: Secondary | ICD-10-CM | POA: Diagnosis not present

## 2018-11-14 DIAGNOSIS — Z01419 Encounter for gynecological examination (general) (routine) without abnormal findings: Secondary | ICD-10-CM | POA: Diagnosis not present

## 2018-11-15 DIAGNOSIS — Z01419 Encounter for gynecological examination (general) (routine) without abnormal findings: Secondary | ICD-10-CM | POA: Diagnosis not present

## 2018-11-15 DIAGNOSIS — Z1151 Encounter for screening for human papillomavirus (HPV): Secondary | ICD-10-CM | POA: Diagnosis not present

## 2018-11-17 ENCOUNTER — Encounter: Payer: BLUE CROSS/BLUE SHIELD | Admitting: Physical Therapy

## 2018-11-18 ENCOUNTER — Ambulatory Visit: Payer: BLUE CROSS/BLUE SHIELD | Admitting: Family Medicine

## 2018-11-20 ENCOUNTER — Other Ambulatory Visit: Payer: Self-pay

## 2018-11-20 ENCOUNTER — Ambulatory Visit: Payer: BC Managed Care – PPO | Admitting: Physical Therapy

## 2018-11-20 ENCOUNTER — Encounter: Payer: Self-pay | Admitting: Physical Therapy

## 2018-11-20 DIAGNOSIS — M25611 Stiffness of right shoulder, not elsewhere classified: Secondary | ICD-10-CM

## 2018-11-20 DIAGNOSIS — G8929 Other chronic pain: Secondary | ICD-10-CM

## 2018-11-20 DIAGNOSIS — M6281 Muscle weakness (generalized): Secondary | ICD-10-CM

## 2018-11-20 DIAGNOSIS — M25511 Pain in right shoulder: Secondary | ICD-10-CM

## 2018-11-20 NOTE — Therapy (Signed)
[facility] [address], [place], [place], [address] Phone: [phone]   Fax:  [phone]  Physical Therapy Treatment  Patient Details  Name: Vicki [name] MRN: [id number] Date of Birth: [birth year] Referring Provider (PT): Dr. Charlann Boxer    Encounter Date: 11/20/2018  PT End of Session - 11/20/18 1024    Visit Number  8    Number of Visits  28    Date for PT Re-Evaluation  12/11/18    Authorization Type  [employer]--per [place]  2 used of 30 visits     PT Start Time  0930    PT Stop Time  1016    PT Time Calculation (min)  46 min    Activity Tolerance  Patient tolerated treatment well       Past Medical History:  Diagnosis Date  . Allergy   . Anxiety    hx ?panic disorder  . Asthma   . Celiac disease   . Chronic gastritis   . GERD (gastroesophageal reflux disease)   . Hyperplastic colon polyp   . Migraines    chronic  . Schatzki's ring   . Skin cancer    chin area  . Thyroid disease     Past Surgical History:  Procedure Laterality Date  . ABLATION  2012  . CESAREAN SECTION  1984  . COLONOSCOPY  2011  . DILATION AND CURETTAGE OF UTERUS  2012  . POLYPECTOMY    . TONSILLECTOMY AND ADENOIDECTOMY  1964  . TUBAL LIGATION  1998  . UPPER GASTROINTESTINAL ENDOSCOPY  [id number]    There were no vitals filed for this visit.  Subjective Assessment - 11/20/18 0931    Subjective Sleeping better b/c I haven't been pushing the exercises.  Only 1 bad night.    Let's hold on ionto today.  Bone density test tomorrow and MRI on Saturday.    Pertinent History  celiac disease which affects nutrient absorption;  hx of left frozen shoulder 10-12 years ago took a long time to resolve attributed to flu shot, had PT and 2 rounds of dose pack     Diagnostic tests  xray:  subtle fracture superior humeral head with a subacute appearance;  referred for bone density     Currently in Pain?  Yes    Pain Score  4     Pain  Location  Shoulder    Pain Orientation  Right    Pain Type  Chronic pain                       [facility] Adult PT Treatment/Exercise - 11/20/18 0001      Shoulder Exercises: Supine   Protraction  Strengthening;Right;20 reps;Weights    Protraction Weight (lbs)  3    Other Supine Exercises  3# small arc 12/6 and 3/9  20x each      Shoulder Exercises: Prone   Extension  Strengthening;Right;10 reps;Weights    Horizontal ABduction 1  AROM;Right;10 reps    Horizontal ABduction 1 Weight (lbs)  0    Other Prone Exercises  prone row 3# 20x       Shoulder Exercises: Standing   Shoulder Elevation Limitations  biceps 3# 20x; triceps green band 20x    Other Standing Exercises  wall push ups       Shoulder Exercises: ROM/Strengthening   Ranger  on 2nd and 3rd steps 10x each     Sustained Retraction with Theraband  lying  on vertical foam roll 1 minute for gentle pectoral stretch     Other ROM/Strengthening Exercises  wall walk 3x    Other ROM/Strengthening Exercises  foam roll thoracic extension in sitting and supine 10x each       Shoulder Exercises: Isometric Strengthening   Flexion  5X5"    Flexion Limitations  wall    Extension  5X5"    Extension Limitations  wall    External Rotation  5X5"    External Rotation Limitations  doorway    Internal Rotation  5X5"    Internal Rotation Limitations  doorway     ABduction  5X5"    ABduction Limitations  wall               PT Short Term Goals - 11/20/18 1029      PT SHORT TERM GOAL #1   Title  The patient will demonstrate understand initial ROM ex HEP on a low load but frequent basis    Status  Achieved      PT SHORT TERM GOAL #2   Title  The patient will report a 25% improvement in pain with dressing and grooming tasks    Status  Not Met      PT SHORT TERM GOAL #3   Title  The patient will have improved flexion to 135 and abduction to 95 degrees needed for reaching, household chores and yardwork     Status  Not  Met        PT Long Term Goals - 11/20/18 1030      PT LONG TERM GOAL #1   Title  The patient will be independent in HEP needed for further improvements in ROM and strength    Status  Achieved      PT LONG TERM GOAL #2   Title  The patient will report a 50% improvement in shoulder pain with dressing, grooming and household activities    Status  Not Met      PT LONG TERM GOAL #3   Title  The patient will have improved UE elevation to 145 degrees needed for reaching overhead    Status  Not Met      PT LONG TERM GOAL #4   Title  Internal rotation behind the back to L2-3 needed for tucking in her shirt, toileting    Status  Not Met      PT LONG TERM GOAL #5   Title  Right shoulder/periscapular strength improved to grossly 3/5 to 3+/5 needed for household activities    Status  Not Met      PT LONG TERM GOAL #6   Title  FOTO functional outcome score improved from 58% limitation to 35%    Status  Not Met            Plan - 11/20/18 1024    Clinical Impression Statement  The patient demonstrates excellent compliance with her HEP focusing on periscapular muscle, deltoid, tricep and bicep strengthening.  We have had to heavily modify or discontinue any rotator cuff specific or shoulder elevation > 70 degrees secondary to intense pain.  She continues to have night pain as well.  Passively, there is good joint mobility and ROM.   Limited progress toward pain and return to function secondary to pain limitations.   Recommend holding PT at this time while she has further testing.  She his having a bone density and MRI this weekend.    Comorbidities  celiacs disease which affects  nutrient absorption and may affect bone density; subacute fracture;   history of left frozen shoulder;  age and gender increased risk for adhesive capsulitis     Examination-Participation Restrictions  Laundry;Cleaning;Meal Prep;Yard Work;Other    Stability/Clinical Decision Making  Evolving/Moderate complexity     Rehab Potential  Good    PT Frequency  2x / week    PT Duration  8 weeks    PT Treatment/Interventions  ADLs/Self Care Home Management;Cryotherapy;Electrical Stimulation;Moist Heat;Iontophoresis 48m/ml Dexamethasone;Ultrasound;Therapeutic exercise;Therapeutic activities;Neuromuscular re-education;Patient/family education;Manual techniques;Dry needling;Taping    PT Next Visit Plan  hold PT for further diagnostic testing       Patient will benefit from skilled therapeutic intervention in order to improve the following deficits and impairments:  Pain, Decreased activity tolerance, Decreased range of motion, Decreased strength, Impaired UE functional use  Visit Diagnosis: 1. Chronic right shoulder pain   2. Stiffness of right shoulder, not elsewhere classified   3. Muscle weakness (generalized)        Problem List Patient Active Problem List   Diagnosis Date Noted  . Frozen shoulder 10/14/2018  . Gastroesophageal reflux disease without esophagitis 11/09/2015  . Belching 11/09/2015  . Loose stools 11/09/2015  . History of migraine headaches 01/09/2013  . Asthma, mild intermittent 01/31/2012  . GERD (gastroesophageal reflux disease) 01/31/2012  . Celiac disease 01/31/2012  . Hypothyroid 01/31/2012  . Panic disorder 01/31/2012   SRuben Im PT 11/20/18 11:19 AM Phone: [phone]: [phone] Singh6/25/2020, 11:18 AM  [facility] [address] [place] [address]Phone: [phone]  Fax:  [phone] Name: Vicki HEMMINGERMRN: [id number] of Birth: [birth year]

## 2018-11-21 ENCOUNTER — Ambulatory Visit
Admission: RE | Admit: 2018-11-21 | Discharge: 2018-11-21 | Disposition: A | Discharge status: Home / Self Care | Payer: BC Managed Care – PPO | Source: Ambulatory Visit | Attending: Family Medicine | Admitting: Family Medicine

## 2018-11-21 ENCOUNTER — Other Ambulatory Visit: Payer: Self-pay

## 2018-11-21 DIAGNOSIS — M8589 Other specified disorders of bone density and structure, multiple sites: Secondary | ICD-10-CM | POA: Diagnosis not present

## 2018-11-21 DIAGNOSIS — M2550 Pain in unspecified joint: Secondary | ICD-10-CM

## 2018-11-22 ENCOUNTER — Other Ambulatory Visit: Payer: Self-pay

## 2018-11-22 ENCOUNTER — Ambulatory Visit
Admission: RE | Admit: 2018-11-22 | Discharge: 2018-11-22 | Disposition: A | Discharge status: Home / Self Care | Payer: BC Managed Care – PPO | Source: Ambulatory Visit | Attending: Family Medicine | Admitting: Family Medicine

## 2018-11-22 DIAGNOSIS — M25511 Pain in right shoulder: Secondary | ICD-10-CM | POA: Diagnosis not present

## 2018-11-22 DIAGNOSIS — G8929 Other chronic pain: Secondary | ICD-10-CM

## 2018-11-25 ENCOUNTER — Ambulatory Visit (INDEPENDENT_AMBULATORY_CARE_PROVIDER_SITE_OTHER): Payer: BC Managed Care – PPO | Admitting: Family Medicine

## 2018-11-25 ENCOUNTER — Ambulatory Visit: Payer: Self-pay

## 2018-11-25 ENCOUNTER — Other Ambulatory Visit: Payer: Self-pay

## 2018-11-25 ENCOUNTER — Encounter: Payer: Self-pay | Admitting: Family Medicine

## 2018-11-25 VITALS — BP 120/64 | HR 71 | Ht 65.0 in | Wt 121.0 lb

## 2018-11-25 DIAGNOSIS — G8929 Other chronic pain: Secondary | ICD-10-CM

## 2018-11-25 DIAGNOSIS — M25511 Pain in right shoulder: Secondary | ICD-10-CM

## 2018-11-25 DIAGNOSIS — M7501 Adhesive capsulitis of right shoulder: Secondary | ICD-10-CM

## 2018-11-25 NOTE — Patient Instructions (Addendum)
Good to see you  All of this is frozen shoulder This should help a lot  Continue with PT  pennsaid pinkie amount topically 2 times daily as needed.  Ice 20 minutes 2 times daily. Usually after activity and before bed. Keep working on the motion  See me again in 4ish weeks

## 2018-11-25 NOTE — Progress Notes (Signed)
[name] Vicki Vicki [name], Vicki Vicki [name] Phone: [phone] Subjective:   Vicki Vicki [name], am serving as a scribe for Dr. Hulan Saas.  I'm seeing this patient by the request  of:    CC: Right shoulder pain follow-up Vicki Vicki [name]   10/13/2018 Frozen shoulder at this time.  Persistence of the pain is improving and the strength is improving a little bit but unfortunately range of motion is now.  Start with formal physical therapy, patient discontinued the nitroglycerin patches.  We discussed vitamin D supplementation and how this could be more beneficial.  Discussed meloxicam for breakthrough pain.  Patient will start formal physical therapy.  Follow-up with me again in 4 to 6 weeks  MRI 11/22/2018: IMPRESSION: 1. Subacromial/subdeltoid bursitis. 2. Intact rotator cuff. 3. Small glenohumeral joint effusion. 4. Abnormal soft tissue in the rotator cuff interval and at the inferior glenohumeral ligament which is nonspecific but can be seen with adhesive capsulitis.  Update 11/25/2018 Vicki Vicki [name] is a 58 y.o. female coming in with complaint of right shoulder pain. Patient states that her shoulder is better but is far away from being back to her norm. She has a constant dull ache. Pain feels stronger in general bu there motion is limited in flexion and abduction.  Patient states that she is not making significant improvements overall.  Patient would like to see if she can do something else.  Was happy though that there is Vicki [name] surgical intervention necessary.    Past Medical History:  Diagnosis Date  . Allergy   . Anxiety    hx ?panic disorder  . Asthma   . Celiac disease   . Chronic gastritis   . GERD (gastroesophageal reflux disease)   . Hyperplastic colon polyp   . Migraines    chronic  . Schatzki's ring   . Skin cancer    chin area  . Thyroid disease    Past Surgical History:  Procedure Laterality Date  . ABLATION  2012  .  CESAREAN SECTION  1984  . COLONOSCOPY  2011  . DILATION AND CURETTAGE OF UTERUS  2012  . POLYPECTOMY    . TONSILLECTOMY AND ADENOIDECTOMY  1964  . TUBAL LIGATION  1998  . UPPER GASTROINTESTINAL ENDOSCOPY  2011,1998   Social History   Socioeconomic History  . Marital status: Married    Spouse name: Vicki Vicki [name]  . Number of children: 4  . Years of education: Not on file  . Highest education level: Not on file  Occupational History  . Occupation: retired Animal nutritionist  . Financial resource strain: Not on file  . Food insecurity    Worry: Not on file    Inability: Not on file  . Transportation needs    Medical: Not on file    Non-medical: Not on file  Tobacco Use  . Smoking status: Never Smoker  . Smokeless tobacco: Never Used  Substance and Sexual Activity  . Alcohol use: Vicki [name]  . Drug use: Vicki [name]  . Sexual activity: Not on file  Lifestyle  . Physical activity    Days per week: Not on file    Minutes per session: Not on file  . Stress: Not on file  Relationships  . Social Herbalist on phone: Not on file    Gets together: Not on file    Attends religious service: Not on file    Active member of club or organization: Not on file  Attends meetings of clubs or organizations: Not on file    Relationship status: Not on file  Other Topics Concern  . Not on file  Social History Narrative  . Not on file   Allergies  Allergen Reactions  . Lamisil [Terbinafine] Hives  . Ceclor [Cefaclor]     hives  . Cephalosporins   . Latex   . Zoster Vaccine Live Rash   Family History  Problem Relation Age of Onset  . Alcohol abuse Mother   . Cancer Mother        breast  . Alcohol abuse Father   . Stroke Father   . Asthma Paternal Aunt   . Colon cancer Paternal Aunt   . Arthritis Maternal Grandmother   . Cancer Sister 68       breast cancer  . Lupus Sister   . Irritable bowel syndrome Sister   . Colon cancer Paternal Grandfather   . Heart disease Neg Hx   .  Esophageal cancer Neg Hx   . Stomach cancer Neg Hx   . Rectal cancer Neg Hx       Current Outpatient Medications (Cardiovascular):  .  nitroGLYCERIN (NITRODUR - DOSED IN MG/24 HR) 0.2 mg/hr patch, Place 1/4 to 1/2 of a patch over affected region. Remove and replace once daily.  Slightly alter skin placement daily (Patient not taking: Reported on 10/16/2018)   Current Outpatient Medications (Respiratory):  .  albuterol (PROVENTIL HFA;VENTOLIN HFA) 108 (90 Base) MCG/ACT inhaler, INHALE 2 PUFFS INTO THE LUNGS EVERY 4 (FOUR) HOURS AS NEEDED FOR WHEEZING OR SHORTNESS OF BREATH. .  mometasone (NASONEX) 50 MCG/ACT nasal spray, USE 2 SPRAYS NASALLY EVERY DAY .  pseudoephedrine (SUDAFED) 30 MG tablet, Take by mouth.   Current Outpatient Medications (Analgesics):  .  meloxicam (MOBIC) 15 MG tablet, TAKE 1 TABLET BY MOUTH EVERY DAY .  SUMAtriptan (IMITREX) 100 MG tablet, TAKE 1 TABLET BY MOUTH EVERY 2 HOURS AS NEEDED FOR MIGRAINE     Current Outpatient Medications (Other):  .  baclofen (LIORESAL) 10 MG tablet, Take 10 mg by mouth every 8 (eight) hours as needed (for migraines). .  diphenoxylate-atropine (LOMOTIL) 2.5-0.025 MG tablet, Take 1 tablet by mouth 4 (four) times daily as needed for diarrhea or loose stools. Marland Kitchen  estradiol (ESTRACE) 0.1 MG/GM vaginal cream, Place vaginally. .  fluconazole (DIFLUCAN) 100 MG tablet, Take 1 tablet (100 mg total) by mouth daily. .  Multiple Vitamins-Minerals (WOMENS MULTIVITAMIN PLUS PO), Take by mouth daily. Marland Kitchen  PARoxetine (PAXIL) 10 MG tablet, TAKE 1 TABLET BY MOUTH EVERY DAY IN THE MORNING .  valACYclovir (VALTREX) 1000 MG tablet, Take two at onset of cold sore and repeat 2 in 12 hours. .  Vitamin D, Ergocalciferol, (DRISDOL) 1.25 MG (50000 UT) CAPS capsule, Take 1 capsule (50,000 Units total) by mouth every 7 (seven) days. Marland Kitchen  ZENPEP 40000-126000 units CPEP, TAKE 2 CAPSULES BY MOUTH 3 TIMES DAILY WITH MEALS. AND 1 CAPSULE WITH EACH SNACK  Current  Facility-Administered Medications (Other):  .  0.9 %  sodium chloride infusion    Past medical history, social, surgical and family history all reviewed in electronic medical record.  Vicki [name] pertanent information unless stated regarding to the chief complaint.   Review of Systems:  Vicki [name] headache, visual changes, nausea, vomiting, diarrhea, constipation, dizziness, abdominal pain, skin rash, fevers, chills, night sweats, weight loss, swollen lymph nodes, body aches, joint swelling, muscle aches, chest pain, shortness of breath, mood changes.   Objective  Blood pressure  120/64, pulse 71, height 5' 5"  (1.651 m), weight 121 lb (54.9 kg), SpO2 97 %.    General: Vicki [name] apparent distress alert and oriented x3 mood and affect normal, dressed appropriately.  HEENT: Pupils equal, extraocular movements intact  Respiratory: Patient's speak in full sentences and does not appear short of breath  Cardiovascular: Vicki [name] lower extremity edema, non tender, Vicki [name] erythema  Skin: Warm dry intact with Vicki [name] signs of infection or rash on extremities or on axial skeleton.  Abdomen: Soft nontender  Neuro: Cranial nerves II through XII are intact, neurovascularly intact in all extremities with 2+ DTRs and 2+ pulses.  Lymph: Vicki [name] lymphadenopathy of posterior or anterior cervical chain or axillae bilaterally.  Gait normal with good balance and coordination.  MSK:  Non tender with full range of motion and good stability and symmetric strength and tone of  elbows, wrist, hip, knee and ankles bilaterally.  Right shoulder exam shows the patient does have some mild atrophy.  Decreased range of motion in all planes, forward flexion of 160 degrees, external rotation of 5 degrees, internal rotation to sacrum.  Mild crepitus noted.  Procedure: Real-time Ultrasound Guided Injection of right glenohumeral joint Device: GE Logiq Q7  Ultrasound guided injection is preferred based studies that show increased duration, increased effect, greater  accuracy, decreased procedural pain, increased response rate with ultrasound guided versus blind injection.  Verbal informed consent obtained.  Time-out conducted.  Noted Vicki [name] overlying erythema, induration, or other signs of local infection.  Skin prepped in a sterile fashion.  Local anesthesia: Topical Ethyl chloride.  With sterile technique and under real time ultrasound guidance:  Joint visualized.  23g 1  inch needle inserted posterior approach. Pictures taken for needle placement. Patient did have injection of 2 cc of 1% lidocaine, 2 cc of 0.5% Marcaine, and 1.0 cc of Kenalog 40 mg/dL. Completed without difficulty  Pain immediately resolved suggesting accurate placement of the medication.  Advised to call if fevers/chills, erythema, induration, drainage, or persistent bleeding.  Images permanently stored and available for review in the ultrasound unit.  Impression: Technically successful ultrasound guided injection.   Impression and Recommendations:     This case required medical decision making of moderate complexity. The above documentation has been reviewed and is accurate and complete Lyndal Pulley, DO       Note: This dictation was prepared with Dragon dictation along with smaller phrase technology. Any transcriptional errors that result from this process are unintentional.

## 2018-11-25 NOTE — Assessment & Plan Note (Signed)
Patient given injection and tolerated the procedure well.  Patient had some mild increase in range of motion immediately.  We discussed with patient to continue with formal physical therapy, will continue all the other things such as vitamin D supplementation.  Patient will follow-up with me again 6 weeks.

## 2018-11-27 ENCOUNTER — Ambulatory Visit: Payer: BC Managed Care – PPO | Attending: Family Medicine | Admitting: Physical Therapy

## 2018-11-27 ENCOUNTER — Encounter: Payer: Self-pay | Admitting: Physical Therapy

## 2018-11-27 ENCOUNTER — Other Ambulatory Visit: Payer: Self-pay

## 2018-11-27 DIAGNOSIS — G8929 Other chronic pain: Secondary | ICD-10-CM | POA: Insufficient documentation

## 2018-11-27 DIAGNOSIS — M25611 Stiffness of right shoulder, not elsewhere classified: Secondary | ICD-10-CM | POA: Diagnosis not present

## 2018-11-27 DIAGNOSIS — M6281 Muscle weakness (generalized): Secondary | ICD-10-CM | POA: Insufficient documentation

## 2018-11-27 DIAGNOSIS — M25511 Pain in right shoulder: Secondary | ICD-10-CM | POA: Diagnosis not present

## 2018-11-27 NOTE — Patient Instructions (Signed)
Access Code: [email or web address]  URL: [email or web address]  Date: 11/27/2018  Prepared by: Ruben Im   Exercises  Seated Shoulder Flexion AAROM with Pulley Behind - 10 reps - 3 sets - 1x daily - 7x weekly  Seated Shoulder Flexion Towel Slide at Table Top - 10 reps - 3 sets - 1x daily - 7x weekly  Shoulder Mobilization [employer] - 10 reps - 3 sets - 1x daily - 7x weekly  Prone Shoulder Extension - Single Arm - 15-20 reps - 1 sets - 1x daily - 7x weekly  Prone Shoulder Horizontal Abduction - 15 reps - 1 sets - 1x daily - 7x weekly  Standing Row with Anchored Resistance - 15-20 reps - 1 sets - 1x daily - 7x weekly  Single Arm Shoulder Extension with Resistance - 15-20 reps - 1 sets - 1x daily - 7x weekly  Standing Shoulder Posterior Capsule Stretch - 3 reps - 1 sets - 20-30 hold - 2-3x daily - 7x weekly  Isometric Shoulder Adduction - 10 reps - 1 sets - 2x daily - 7x weekly  Seated Shoulder Flexion Towel Slide at Table Top - 10 reps - 1 sets - 1x daily - 7x weekly  Standing Single Arm Shoulder Flexion Towel Slide at Table Top - 10 reps - 1 sets - 1x daily - 7x weekly

## 2018-11-27 NOTE — Therapy (Signed)
[facility] [address], [place], [place], [address] Phone: [phone]   Fax:  [phone]  Physical Therapy Treatment  Patient Details  Name: Vicki [name] MRN: [id number] Date of Birth: 1960/09/18 Referring Provider (PT): Dr. Charlann Boxer    Encounter Date: 11/27/2018  PT End of Session - 11/27/18 1441    Visit Number  9    Number of Visits  28    Date for PT Re-Evaluation  12/11/18    Authorization Type  [employer]--per Effie [employer]  2 used of 30 visits     PT Start Time  1027    PT Stop Time  1117    PT Time Calculation (min)  50 min    Activity Tolerance  Patient tolerated treatment well       Past Medical History:  Diagnosis Date  . Allergy   . Anxiety    hx ?panic disorder  . Asthma   . Celiac disease   . Chronic gastritis   . GERD (gastroesophageal reflux disease)   . Hyperplastic colon polyp   . Migraines    chronic  . Schatzki's ring   . Skin cancer    chin area  . Thyroid disease     Past Surgical History:  Procedure Laterality Date  . ABLATION  2012  . CESAREAN SECTION  1984  . COLONOSCOPY  2011  . DILATION AND CURETTAGE OF UTERUS  2012  . POLYPECTOMY    . TONSILLECTOMY AND ADENOIDECTOMY  1964  . TUBAL LIGATION  1998  . UPPER GASTROINTESTINAL ENDOSCOPY  8921,1941    There were no vitals filed for this visit.  Subjective Assessment - 11/27/18 1028    Subjective  The start of osteoporosis but mild.  MRI shows no rotator cuff tear, just inflammation.  Had injection into subacromial region the other day.  Felt good the first 24 hours. Sleeping better.    Pertinent History  celiac disease which affects nutrient absorption;  hx of left frozen shoulder 10-12 years ago took a long time to resolve attributed to flu shot, had PT and 2 rounds of dose pack     Patient Stated Goals  be able to sleep the night without waking in pain;  not have pain all the time; both sides equal ROM     Currently in Pain?   No/denies    Pain Score  0-No pain    Pain Orientation  Right         [facility] PT Assessment - 11/27/18 0001      AROM   Right Shoulder Flexion  119 Degrees    Right Shoulder ABduction  105 Degrees    Right Shoulder Internal Rotation  --   right PSIS   Right Shoulder External Rotation  46 Degrees                   [facility] Adult PT Treatment/Exercise - 11/27/18 0001      Shoulder Exercises: Supine   Other Supine Exercises  review of supine cane flexion stretching      Shoulder Exercises: Seated   Other Seated Exercises  self inferior GH mob with table slides 20x flexion, scaption 10x      Shoulder Exercises: Standing   Other Standing Exercises  towel roll in axilla with self joint inferior distraction with adduction bias 5x     Other Standing Exercises  holding 5# weight pendulums       Shoulder Exercises: Pulleys  Other Pulley Exercises  discussed appropriate dosage/intensity of pulleys      Shoulder Exercises: Stretch   Cross Chest Stretch  3 reps;30 seconds      Moist Heat Therapy   Number Minutes Moist Heat  10 Minutes   concurrent with manual therapy     Moist Heat Location  Shoulder      Manual Therapy   Joint Mobilization  glenohumeral joint distraction, grade 2 inferior and posterior mobs; inferior mobs with movement 20x     Soft tissue mobilization  gentle pectoral stretch              PT Education - 11/27/18 1131    Education Details  Access Code: [email or web address] inferior capsule stretch, posterior capsule stretch;  self inferior mob with table slides    Person(s) Educated  Patient    Methods  Explanation;Demonstration;Handout    Comprehension  Returned demonstration;Verbalized understanding       PT Short Term Goals - 11/20/18 1029      PT SHORT TERM GOAL #1   Title  The patient will demonstrate understand initial ROM ex HEP on a low load but frequent basis    Status  Achieved      PT SHORT TERM GOAL #2   Title  The patient will report a  25% improvement in pain with dressing and grooming tasks    Status  Not Met      PT SHORT TERM GOAL #3   Title  The patient will have improved flexion to 135 and abduction to 95 degrees needed for reaching, household chores and yardwork     Status  Not Met        PT Long Term Goals - 11/20/18 1030      PT LONG TERM GOAL #1   Title  The patient will be independent in HEP needed for further improvements in ROM and strength    Status  Achieved      PT LONG TERM GOAL #2   Title  The patient will report a 50% improvement in shoulder pain with dressing, grooming and household activities    Status  Not Met      PT LONG TERM GOAL #3   Title  The patient will have improved UE elevation to 145 degrees needed for reaching overhead    Status  Not Met      PT LONG TERM GOAL #4   Title  Internal rotation behind the back to L2-3 needed for tucking in her shirt, toileting    Status  Not Met      PT LONG TERM GOAL #5   Title  Right shoulder/periscapular strength improved to grossly 3/5 to 3+/5 needed for household activities    Status  Not Met      PT LONG TERM GOAL #6   Title  FOTO functional outcome score improved from 58% limitation to 35%    Status  Not Met            Plan - 11/27/18 1104    Clinical Impression Statement  The patient has a reduction in shoulder ROM since start of care however she reports that now that she knows she does not have a rotator cuff tear she feels more confident in stretching more and that she's not making the problem worse.  She reports the recent injection has helped with the pain and she is sleeping better.  Much improved joint and soft tissue mobility following manual therapy in conjunction with moist  heat.  Instructed in frequent ROM every 2 hours at home for 3-5 minutes.  Therapist providing verbal cues to avoid shoulder hike and to decrease thoracic kyphosis during seated ex. Reports pain no worse during and following treatment session.     Comorbidities  celiacs disease which affects nutrient absorption and may affect bone density; subacute fracture;   history of left frozen shoulder;  age and gender increased risk for adhesive capsulitis     Rehab Potential  Good    PT Frequency  2x / week    PT Duration  8 weeks    PT Treatment/Interventions  ADLs/Self Care Home Management;Cryotherapy;Electrical Stimulation;Moist Heat;Iontophoresis 74m/ml Dexamethasone;Ultrasound;Therapeutic exercise;Therapeutic activities;Neuromuscular re-education;Patient/family education;Manual techniques;Dry needling;Taping    PT Next Visit Plan  recheck ROM;  review capsular stretching;  progress ROM    PT Home Exercise Plan   Access Code: [email or web address]        Patient will benefit from skilled therapeutic intervention in order to improve the following deficits and impairments:  Pain, Decreased activity tolerance, Decreased range of motion, Decreased strength, Impaired UE functional use  Visit Diagnosis: 1. Chronic right shoulder pain   2. Stiffness of right shoulder, not elsewhere classified   3. Muscle weakness (generalized)        Problem List Patient Active Problem List   Diagnosis Date Noted  . Frozen shoulder 10/14/2018  . Gastroesophageal reflux disease without esophagitis 11/09/2015  . Belching 11/09/2015  . Loose stools 11/09/2015  . History of migraine headaches 01/09/2013  . Asthma, mild intermittent 01/31/2012  . GERD (gastroesophageal reflux disease) 01/31/2012  . Celiac disease 01/31/2012  . Hypothyroid 01/31/2012  . Panic disorder 01/31/2012   SRuben Im PT 11/27/18 2:48 PM Phone: [phone]: [phone] Singh7/06/2018, 2:47 PM  [facility] [address] [place] [address]: [phone]  Fax:  [phone] Name: Vicki RIALSMRN: [id number] of Birth: [birth year]

## 2018-12-04 ENCOUNTER — Encounter

## 2018-12-06 ENCOUNTER — Other Ambulatory Visit: Payer: Self-pay | Admitting: Family Medicine

## 2018-12-09 ENCOUNTER — Encounter: Payer: Self-pay | Admitting: Physical Therapy

## 2018-12-09 ENCOUNTER — Other Ambulatory Visit: Payer: Self-pay

## 2018-12-09 ENCOUNTER — Ambulatory Visit: Payer: BC Managed Care – PPO | Admitting: Physical Therapy

## 2018-12-09 DIAGNOSIS — M6281 Muscle weakness (generalized): Secondary | ICD-10-CM | POA: Diagnosis not present

## 2018-12-09 DIAGNOSIS — G8929 Other chronic pain: Secondary | ICD-10-CM | POA: Diagnosis not present

## 2018-12-09 DIAGNOSIS — M25511 Pain in right shoulder: Secondary | ICD-10-CM | POA: Diagnosis not present

## 2018-12-09 DIAGNOSIS — M25611 Stiffness of right shoulder, not elsewhere classified: Secondary | ICD-10-CM | POA: Diagnosis not present

## 2018-12-09 NOTE — Therapy (Signed)
[facility] [address] [place], [place], [address] Phone: [phone]   Fax:  [phone]  Physical Therapy Treatment/Recertification   Patient Details  Name: Vicki [name] MRN: [id number] Date of Birth: 01-24-1961 Referring Provider (PT): Dr. Charlann Boxer    Encounter Date: 12/09/2018  PT End of Session - 12/09/18 1228    Visit Number  10    Number of Visits  28    Date for PT Re-Evaluation  02/03/19    Authorization Type  BCBS--per [place]  2 used of 30 visits     PT Start Time  1028    PT Stop Time  1110    PT Time Calculation (min)  42 min    Activity Tolerance  Patient tolerated treatment well       Past Medical History:  Diagnosis Date  . Allergy   . Anxiety    hx ?panic disorder  . Asthma   . Celiac disease   . Chronic gastritis   . GERD (gastroesophageal reflux disease)   . Hyperplastic colon polyp   . Migraines    chronic  . Schatzki's ring   . Skin cancer    chin area  . Thyroid disease     Past Surgical History:  Procedure Laterality Date  . ABLATION  2012  . CESAREAN SECTION  1984  . COLONOSCOPY  2011  . DILATION AND CURETTAGE OF UTERUS  2012  . POLYPECTOMY    . TONSILLECTOMY AND ADENOIDECTOMY  1964  . TUBAL LIGATION  1998  . UPPER GASTROINTESTINAL ENDOSCOPY  2263,3354    There were no vitals filed for this visit.      [facility] PT Assessment - 12/09/18 0001      Assessment   Medical Diagnosis  right shoulder pain       Observation/Other Assessments   Focus on Therapeutic Outcomes (FOTO)   33% limitation       AROM   Right Shoulder Flexion  145 Degrees    Right Shoulder ABduction  152 Degrees    Right Shoulder Internal Rotation  --   L5   Right Shoulder External Rotation  58 Degrees      Strength   Right Shoulder Flexion  3+/5    Right Shoulder Extension  3+/5    Right Shoulder ABduction  3/5    Right Shoulder Internal Rotation  3+/5    Right Shoulder External  Rotation  3+/5                   [facility] Adult PT Treatment/Exercise - 12/09/18 0001      Shoulder Exercises: Standing   Other Standing Exercises  internal rotation at the counter top     Other Standing Exercises  review of capsular stretches       Shoulder Exercises: ROM/Strengthening   Other ROM/Strengthening Exercises  sustained stretch using 1# weight for flexion 20 sec 3x     Other ROM/Strengthening Exercises  review of HEP and discussion of focus areas      Moist Heat Therapy   Number Minutes Moist Heat  10 Minutes    Moist Heat Location  Shoulder      Manual Therapy   Joint Mobilization  glenohumeral joint distraction, grade 3 inferior and posterior mobs; inferior mobs with movement;  mobs with movement in varying planes of motion     Soft tissue mobilization  gentle pectoral stretch     Scapular Mobilization  medial and lateral  scapular glides                PT Short Term Goals - 12/09/18 1111      PT SHORT TERM GOAL #1   Title  The patient will demonstrate understand initial ROM ex HEP on a low load but frequent basis    Status  Achieved      PT SHORT TERM GOAL #2   Title  The patient will report a 25% improvement in pain with dressing and grooming tasks    Status  Achieved      PT SHORT TERM GOAL #3   Title  The patient will have improved flexion to 135 and abduction to 95 degrees needed for reaching, household chores and yardwork     Status  Achieved      PT SHORT TERM GOAL #4   Title  The patient will have improved internal rotation behind back to L4-5 region needed for dressing and toileting    Status  Achieved        PT Long Term Goals - 12/09/18 1113      PT LONG TERM GOAL #1   Title  The patient will be independent in HEP needed for further improvements in ROM and strength    Status  On-going    Target Date  02/03/19      PT LONG TERM GOAL #2   Title  The patient will report a 50% improvement in shoulder pain with dressing,  grooming and household activities    Baseline  80% on 7/14    Status  Achieved      PT LONG TERM GOAL #3   Title  The patient will have improved UE elevation to 155 degrees needed for reaching overhead    Status  Revised      PT LONG TERM GOAL #4   Title  Internal rotation behind the back to L2-3 needed for tucking in her shirt, toileting    Time  8    Period  Weeks    Status  On-going      PT LONG TERM GOAL #5   Title  Right shoulder/periscapular strength improved to grossly to 3+/5 to 4-/5 needed for household activities    Period  Weeks    Status  Revised      PT LONG TERM GOAL #6   Title  FOTO functional outcome score improved from 58% limitation to 35%    Status  Achieved      PT LONG TERM GOAL #7   Title  Patient will be able to clean shower door with improved motor control and ROM ease and minimal discomfort.    Time  8    Period  Weeks    Status  New            Plan - 12/09/18 1219    Clinical Impression Statement  The patient reports she is 80% better overall.  Sleeping better reduced pain overall with functional activities.  Her FOTO functional outcome score has improved from 58% limitation to 33% limitation.  Her ROM has improved as well in all planes but still limited with flexion, abduction and internal rotation. Painful arc with abduction persists but much less irritable since recent injection.  Strength improving to grossly 3+/5 but she would benefit from a further progression of strengthening as well.  She would benefit from manual therapy, progression of ROM and strengthening ex at a reduced frequency of 1x/week.    Comorbidities  celiacs  disease which affects nutrient absorption and may affect bone density; subacute fracture;   history of left frozen shoulder;  age and gender increased risk for adhesive capsulitis     Examination-Activity Limitations  Hygiene/Grooming;Toileting;Dressing;Sleep;Reach Overhead;Carry;Lift    Examination-Participation Restrictions   Laundry;Cleaning;Meal Prep;Yard Work;Other    Rehab Potential  Good    PT Frequency  1x / week    PT Duration  8 weeks    PT Treatment/Interventions  ADLs/Self Care Home Management;Cryotherapy;Electrical Stimulation;Moist Heat;Iontophoresis 40m/ml Dexamethasone;Ultrasound;Therapeutic exercise;Therapeutic activities;Neuromuscular re-education;Patient/family education;Manual techniques;Dry needling;Taping    PT Next Visit Plan  manual therapy with concurrent moist heat;  ROM progression with UE Ranger on wall;  try 1# sustained supine stretching ;  1x/week    PT Home Exercise Plan   Access Code: [email or web address]        Patient will benefit from skilled therapeutic intervention in order to improve the following deficits and impairments:  Pain, Decreased activity tolerance, Decreased range of motion, Decreased strength, Impaired UE functional use  Visit Diagnosis: 1. Chronic right shoulder pain   2. Stiffness of right shoulder, not elsewhere classified   3. Muscle weakness (generalized)        Problem List Patient Active Problem List   Diagnosis Date Noted  . Frozen shoulder 10/14/2018  . Gastroesophageal reflux disease without esophagitis 11/09/2015  . Belching 11/09/2015  . Loose stools 11/09/2015  . History of migraine headaches 01/09/2013  . Asthma, mild intermittent 01/31/2012  . GERD (gastroesophageal reflux disease) 01/31/2012  . Celiac disease 01/31/2012  . Hypothyroid 01/31/2012  . Panic disorder 01/31/2012   SRuben Im PT 12/09/18 1:27 PM Phone: [phone]: [phone] Singh7/14/2020, 1:27 PM  [facility] [address] [place] [address]Phone: [phone]  Fax:  [phone] Name: MLYVIA MONDESIRMRN: [id number] of Birth: 2Apr 27, 1962

## 2018-12-11 ENCOUNTER — Other Ambulatory Visit: Payer: Self-pay

## 2018-12-11 ENCOUNTER — Encounter: Payer: Self-pay | Admitting: Family Medicine

## 2018-12-11 MED ORDER — DICLOFENAC SODIUM 2 % TD SOLN: 2.0000 g | Freq: Two times a day (BID) | TRANSDERMAL | 3 refills | Status: DC

## 2018-12-17 ENCOUNTER — Encounter: Payer: Self-pay | Admitting: Physical Therapy

## 2018-12-17 ENCOUNTER — Ambulatory Visit: Payer: BC Managed Care – PPO | Admitting: Physical Therapy

## 2018-12-17 ENCOUNTER — Other Ambulatory Visit: Payer: Self-pay

## 2018-12-17 DIAGNOSIS — M25511 Pain in right shoulder: Secondary | ICD-10-CM

## 2018-12-17 DIAGNOSIS — M6281 Muscle weakness (generalized): Secondary | ICD-10-CM | POA: Diagnosis not present

## 2018-12-17 DIAGNOSIS — M25611 Stiffness of right shoulder, not elsewhere classified: Secondary | ICD-10-CM | POA: Diagnosis not present

## 2018-12-17 DIAGNOSIS — G8929 Other chronic pain: Secondary | ICD-10-CM

## 2018-12-17 NOTE — Therapy (Signed)
[facility] [address] [place], [place], [address] Phone: [phone]   Fax:  [phone]  Physical Therapy Treatment  Patient Details  Name: Vicki [name] MRN: [id number] Date of Birth: 1960/12/29 Referring Provider (PT): Dr. Charlann Boxer    Encounter Date: 12/17/2018  PT End of Session - 12/17/18 1201    Visit Number  11    Number of Visits  28    Date for PT Re-Evaluation  02/03/19    Authorization Type  BCBS--per [place]  2 used of 30 visits     PT Start Time  0930    PT Stop Time  1015    PT Time Calculation (min)  45 min    Activity Tolerance  Patient tolerated treatment well       Past Medical History:  Diagnosis Date  . Allergy   . Anxiety    hx ?panic disorder  . Asthma   . Celiac disease   . Chronic gastritis   . GERD (gastroesophageal reflux disease)   . Hyperplastic colon polyp   . Migraines    chronic  . Schatzki's ring   . Skin cancer    chin area  . Thyroid disease     Past Surgical History:  Procedure Laterality Date  . ABLATION  2012  . CESAREAN SECTION  1984  . COLONOSCOPY  2011  . DILATION AND CURETTAGE OF UTERUS  2012  . POLYPECTOMY    . TONSILLECTOMY AND ADENOIDECTOMY  1964  . TUBAL LIGATION  1998  . UPPER GASTROINTESTINAL ENDOSCOPY  [id number]    There were no vitals filed for this visit.  Subjective Assessment - 12/17/18 0932    Subjective  I did fine after last time.  I'm feeling better.  Swimming and doing a variety of exercise.    Pertinent History  celiac disease which affects nutrient absorption;  hx of left frozen shoulder 10-12 years ago took a long time to resolve attributed to flu shot, had PT and 2 rounds of dose pack     Currently in Pain?  No/denies    Pain Score  0-No pain    Pain Location  Shoulder    Pain Orientation  Right    Pain Type  Chronic pain         [facility] PT Assessment - 12/17/18 0001      AROM   Right Shoulder Flexion  145 Degrees    Right Shoulder ABduction  158 Degrees    Right Shoulder Internal Rotation  --   L3   Right Shoulder External Rotation  76 Degrees                   [facility] Adult PT Treatment/Exercise - 12/17/18 0001      Shoulder Exercises: Supine   Other Supine Exercises  static stretching 1# 30 sec holds flexion, internal and external rotation 1 rep each concurrent with moist heat       Shoulder Exercises: Sidelying   ABduction Limitations  static stretch with 1# 30 sec hold     Other Sidelying Exercises  posterior capsule stretch in sidelying 3x 30 sec holds      Shoulder Exercises: ROM/Strengthening   Ranger  on wall L5 20x    Other ROM/Strengthening Exercises  UE on wall near floor for internal rotation 15x     Other ROM/Strengthening Exercises  standing pulleys for internal rotation 5x       Moist Heat Therapy  Number Minutes Moist Heat  10 Minutes   concurrent with manual therapy    Moist Heat Location  Shoulder      Manual Therapy   Manual therapy comments  scapular mobs in sidelying and shoulder internal rotation     Joint Mobilization  glenohumeral joint distraction, grade 3 inferior and posterior mobs; inferior mobs with movement;  mobs with movement in varying planes of motion     Soft tissue mobilization  gentle pectoral stretch     Scapular Mobilization  medial and lateral scapular glides     Muscle Energy Technique  contract relax to increase internal rotation ROM                PT Short Term Goals - 12/17/18 1205      PT SHORT TERM GOAL #1   Title  The patient will demonstrate understand initial ROM ex HEP on a low load but frequent basis    Status  Achieved      PT SHORT TERM GOAL #2   Title  The patient will report a 25% improvement in pain with dressing and grooming tasks    Status  Achieved      PT SHORT TERM GOAL #3   Title  The patient will have improved flexion to 135 and abduction to 95 degrees needed for reaching, household chores and yardwork      Status  Achieved      PT SHORT TERM GOAL #4   Title  The patient will have improved internal rotation behind back to L4-5 region needed for dressing and toileting    Status  Achieved        PT Long Term Goals - 12/17/18 1205      PT LONG TERM GOAL #1   Title  The patient will be independent in HEP needed for further improvements in ROM and strength    Status  On-going      PT LONG TERM GOAL #2   Title  The patient will report a 50% improvement in shoulder pain with dressing, grooming and household activities    Status  Achieved      PT LONG TERM GOAL #3   Title  The patient will have improved UE elevation to 155 degrees needed for reaching overhead    Time  8    Period  Weeks    Status  On-going      PT LONG TERM GOAL #4   Title  Internal rotation behind the back to L2-3 needed for tucking in her shirt, toileting    Status  Achieved      PT LONG TERM GOAL #5   Title  Right shoulder/periscapular strength improved to grossly to 3+/5 to 4-/5 needed for household activities    Status  On-going      PT LONG TERM GOAL #6   Title  FOTO functional outcome score improved from 58% limitation to 35%    Status  Achieved      PT LONG TERM GOAL #7   Title  Patient will be able to clean shower door with improved motor control and ROM ease and minimal discomfort.    Status  On-going            Plan - 12/17/18 1201    Clinical Impression Statement  The patient has much improved shoulder abduction, internal and external ROM although still limited from full range.  Good response to mobilizations with movement with moist heat to enhance soft tissue  extensibility.  Good home carryover with HEP.  She would benefit from a further progression of ROM before readiness for independence of HEP.    Comorbidities  celiacs disease which affects nutrient absorption and may affect bone density; subacute fracture;   history of left frozen shoulder;  age and gender increased risk for adhesive  capsulitis        Patient will benefit from skilled therapeutic intervention in order to improve the following deficits and impairments:  Pain, Decreased activity tolerance, Decreased range of motion, Decreased strength, Impaired UE functional use  Visit Diagnosis: 1. Chronic right shoulder pain   2. Stiffness of right shoulder, not elsewhere classified   3. Muscle weakness (generalized)        Problem List Patient Active Problem List   Diagnosis Date Noted  . Frozen shoulder 10/14/2018  . Gastroesophageal reflux disease without esophagitis 11/09/2015  . Belching 11/09/2015  . Loose stools 11/09/2015  . History of migraine headaches 01/09/2013  . Asthma, mild intermittent 01/31/2012  . GERD (gastroesophageal reflux disease) 01/31/2012  . Celiac disease 01/31/2012  . Hypothyroid 01/31/2012  . Panic disorder 01/31/2012   Ruben Im, PT 12/17/18 12:07 PM Phone: [phone] Fax: [phone] Alvera Singh 12/17/2018, 12:06 PM  [facility] [address] [place], [place], [address] Phone: [phone]   Fax:  [phone]  Name: Vicki [name] MRN: [id number] Date of Birth: 1960/07/05

## 2018-12-23 ENCOUNTER — Other Ambulatory Visit: Payer: Self-pay

## 2018-12-23 ENCOUNTER — Ambulatory Visit: Payer: BC Managed Care – PPO | Admitting: Physical Therapy

## 2018-12-23 ENCOUNTER — Encounter: Payer: Self-pay | Admitting: Physical Therapy

## 2018-12-23 DIAGNOSIS — M25511 Pain in right shoulder: Secondary | ICD-10-CM | POA: Diagnosis not present

## 2018-12-23 DIAGNOSIS — M6281 Muscle weakness (generalized): Secondary | ICD-10-CM

## 2018-12-23 DIAGNOSIS — G8929 Other chronic pain: Secondary | ICD-10-CM | POA: Diagnosis not present

## 2018-12-23 DIAGNOSIS — M25611 Stiffness of right shoulder, not elsewhere classified: Secondary | ICD-10-CM | POA: Diagnosis not present

## 2018-12-23 NOTE — Patient Instructions (Signed)
Access Code: [email or web address]  URL: [email or web address]  Date: 12/23/2018  Prepared by: Ruben Im   Exercises  Seated Shoulder Flexion AAROM with Pulley Behind - 10 reps - 3 sets - 1x daily - 7x weekly  Seated Shoulder Flexion Towel Slide at Table Top - 10 reps - 3 sets - 1x daily - 7x weekly  Shoulder Mobilization [employer] - 10 reps - 3 sets - 1x daily - 7x weekly  Prone Shoulder Extension - Single Arm - 15-20 reps - 1 sets - 1x daily - 7x weekly  Prone Shoulder Horizontal Abduction - 15 reps - 1 sets - 1x daily - 7x weekly  Standing Row with Anchored Resistance - 15-20 reps - 1 sets - 1x daily - 7x weekly  Single Arm Shoulder Extension with Resistance - 15-20 reps - 1 sets - 1x daily - 7x weekly  Standing Shoulder Posterior Capsule Stretch - 3 reps - 1 sets - 20-30 hold - 2-3x daily - 7x weekly  Isometric Shoulder Adduction - 10 reps - 1 sets - 2x daily - 7x weekly  Seated Shoulder Flexion Towel Slide at Table Top - 10 reps - 1 sets - 1x daily - 7x weekly  Standing Single Arm Shoulder Flexion Towel Slide at Table Top - 10 reps - 1 sets - 1x daily - 7x weekly  Child's Pose with Thread the Needle - 10 reps - 1 sets - 1x daily - 7x weekly  Childs Pose - 10 reps - 1 sets - 1x daily - 7x weekly  Standing Shoulder Internal Rotation Stretch with Hands Behind Back - 10 reps - 1 sets - 1x daily - 7x weekly

## 2018-12-23 NOTE — Therapy (Signed)
[facility] [address] [place], [place], [address] Phone: [phone]   Fax:  [phone]  Physical Therapy Treatment  Patient Details  Name: Vicki [name] MRN: [id number] Date of Birth: August 19, 1960 Referring Provider (PT): Dr. Charlann Boxer    Encounter Date: 12/23/2018  PT End of Session - 12/23/18 1428    Visit Number  12    Number of Visits  28    Date for PT Re-Evaluation  02/03/19    Authorization Type  BCBS--per [place]  2 used of 30 visits     PT Start Time  1330    PT Stop Time  1420    PT Time Calculation (min)  50 min    Activity Tolerance  Patient tolerated treatment well       Past Medical History:  Diagnosis Date  . Allergy   . Anxiety    hx ?panic disorder  . Asthma   . Celiac disease   . Chronic gastritis   . GERD (gastroesophageal reflux disease)   . Hyperplastic colon polyp   . Migraines    chronic  . Schatzki's ring   . Skin cancer    chin area  . Thyroid disease     Past Surgical History:  Procedure Laterality Date  . ABLATION  2012  . CESAREAN SECTION  1984  . COLONOSCOPY  2011  . DILATION AND CURETTAGE OF UTERUS  2012  . POLYPECTOMY    . TONSILLECTOMY AND ADENOIDECTOMY  1964  . TUBAL LIGATION  1998  . UPPER GASTROINTESTINAL ENDOSCOPY  [id number]    There were no vitals filed for this visit.  Subjective Assessment - 12/23/18 1328    Subjective  Feeling good.  Some discomfort but a lot better.  Took Alleve only 1 evening.  I can squeegie the shower wall without thinking about it now.    Pertinent History  celiac disease which affects nutrient absorption;  hx of left frozen shoulder 10-12 years ago took a long time to resolve attributed to flu shot, had PT and 2 rounds of dose pack     Diagnostic tests  xray:  subtle fracture superior humeral head with a subacute appearance;  referred for bone density     Patient Stated Goals  be able to sleep the night without waking in  pain;  not have pain all the time; both sides equal ROM     Currently in Pain?  No/denies    Pain Score  0-No pain    Pain Orientation  Right         [facility] PT Assessment - 12/23/18 0001      AROM   Right Shoulder Flexion  153 Degrees    Right Shoulder ABduction  165 Degrees    Right Shoulder Internal Rotation  --   L1   Right Shoulder External Rotation  80 Degrees      Strength   Right Shoulder Flexion  4-/5    Right Shoulder Extension  4/5    Right Shoulder ABduction  4-/5    Right Shoulder Internal Rotation  4-/5    Right Shoulder External Rotation  4-/5                   [facility] Adult PT Treatment/Exercise - 12/23/18 0001      Shoulder Exercises: Supine   Other Supine Exercises  static flexion 30 sec holds to review HEP       Shoulder Exercises: ROM/Strengthening  Ranger  on wall L 28 10x, L22 5x, L15 5x    scaption 5x    Other ROM/Strengthening Exercises  foam roll thread the needle 15x    Other ROM/Strengthening Exercises  foam roll childs pose for  shoulder flexion 20x       Shoulder Exercises: Stretch   Other Shoulder Stretches  internal rotation behind back with scapular squeezes 10x     Other Shoulder Stretches  review of HEP and discussion of areas of emphasis       Moist Heat Therapy   Number Minutes Moist Heat  10 Minutes   concurrent with manual therapy    Moist Heat Location  Shoulder      Manual Therapy   Manual therapy comments  scapular mobs in sidelying and shoulder internal rotation     Joint Mobilization  glenohumeral joint distraction, grade 3 inferior and posterior mobs; inferior mobs with movement;  mobs with movement in varying planes of motion     Soft tissue mobilization  gentle pectoral stretch     Scapular Mobilization  medial and lateral scapular glides              PT Education - 12/23/18 1427    Education Details  Access Code: [email or web address]    Person(s) Educated  Patient    Methods  Explanation;Demonstration;Handout     Comprehension  Returned demonstration;Verbalized understanding       PT Short Term Goals - 12/17/18 1205      PT SHORT TERM GOAL #1   Title  The patient will demonstrate understand initial ROM ex HEP on a low load but frequent basis    Status  Achieved      PT SHORT TERM GOAL #2   Title  The patient will report a 25% improvement in pain with dressing and grooming tasks    Status  Achieved      PT SHORT TERM GOAL #3   Title  The patient will have improved flexion to 135 and abduction to 95 degrees needed for reaching, household chores and yardwork     Status  Achieved      PT SHORT TERM GOAL #4   Title  The patient will have improved internal rotation behind back to L4-5 region needed for dressing and toileting    Status  Achieved        PT Long Term Goals - 12/23/18 1701      PT LONG TERM GOAL #1   Title  The patient will be independent in HEP needed for further improvements in ROM and strength    Time  8    Period  Weeks    Status  On-going      PT LONG TERM GOAL #2   Title  The patient will report a 50% improvement in shoulder pain with dressing, grooming and household activities    Status  Achieved      PT LONG TERM GOAL #3   Title  The patient will have improved UE elevation to 155 degrees needed for reaching overhead    Status  Achieved      PT LONG TERM GOAL #4   Title  Internal rotation behind the back to L2-3 needed for tucking in her shirt, toileting    Status  Achieved      PT LONG TERM GOAL #5   Title  Right shoulder/periscapular strength improved to grossly to 3+/5 to 4-/5 needed for household activities    Status  Achieved  PT LONG TERM GOAL #6   Title  FOTO functional outcome score improved from 58% limitation to 35%    Status  Achieved      PT LONG TERM GOAL #7   Title  Patient will be able to clean shower door with improved motor control and ROM ease and minimal discomfort.    Status  Achieved            Plan - 12/23/18 1409     Clinical Impression Statement  The patient has made excellent progress in the last few weeks with pain reduction, functional improvements, ROM and strength gains.  She has responded well to glenohumeral and scapualr joint mobs and  is very compliant with her current HEP.  We have begun tapering her visits and will follow up in 2 weeks for a final progression with expected discharge within the next 1-2 visits.    Good progress with rehab goals.    Comorbidities  celiacs disease which affects nutrient absorption and may affect bone density; subacute fracture;   history of left frozen shoulder;  age and gender increased risk for adhesive capsulitis     Rehab Potential  Good    PT Frequency  1x / week    PT Duration  8 weeks    PT Treatment/Interventions  ADLs/Self Care Home Management;Cryotherapy;Electrical Stimulation;Moist Heat;Iontophoresis 45m/ml Dexamethasone;Ultrasound;Therapeutic exercise;Therapeutic activities;Neuromuscular re-education;Patient/family education;Manual techniques;Dry needling;Taping    PT Next Visit Plan  manual therapy with concurrent moist heat;  ROM progression with UE Ranger on wall; capsular stretching;  see how MD appt went    PT Home Exercise Plan   Access Code: [email or web address]        Patient will benefit from skilled therapeutic intervention in order to improve the following deficits and impairments:  Pain, Decreased activity tolerance, Decreased range of motion, Decreased strength, Impaired UE functional use  Visit Diagnosis: 1. Chronic right shoulder pain   2. Stiffness of right shoulder, not elsewhere classified   3. Muscle weakness (generalized)        Problem List Patient Active Problem List   Diagnosis Date Noted  . Frozen shoulder 10/14/2018  . Gastroesophageal reflux disease without esophagitis 11/09/2015  . Belching 11/09/2015  . Loose stools 11/09/2015  . History of migraine headaches 01/09/2013  . Asthma, mild intermittent 01/31/2012  . GERD  (gastroesophageal reflux disease) 01/31/2012  . Celiac disease 01/31/2012  . Hypothyroid 01/31/2012  . Panic disorder 01/31/2012   SRuben Im PT 12/23/18 5:04 PM Phone: [phone]: [phone] Singh7/28/2020, 5:03 PM  [facility] [address] [place] [address]Phone: [phone]  Fax:  [phone] Name: MCHALISE PEMRN: [id number] of Birth: 2Jan 02, 1962

## 2018-12-30 ENCOUNTER — Other Ambulatory Visit: Payer: Self-pay | Admitting: Family Medicine

## 2018-12-30 ENCOUNTER — Encounter: Payer: BC Managed Care – PPO | Admitting: Physical Therapy

## 2018-12-31 ENCOUNTER — Encounter: Payer: Self-pay | Admitting: Family Medicine

## 2018-12-31 ENCOUNTER — Other Ambulatory Visit: Payer: Self-pay

## 2018-12-31 ENCOUNTER — Ambulatory Visit (INDEPENDENT_AMBULATORY_CARE_PROVIDER_SITE_OTHER): Payer: BC Managed Care – PPO | Admitting: Family Medicine

## 2018-12-31 DIAGNOSIS — M7501 Adhesive capsulitis of right shoulder: Secondary | ICD-10-CM

## 2018-12-31 NOTE — Progress Notes (Signed)
[name] [place], [place] [address] Phone: [phone] Subjective:   Vicki [name], am serving as a scribe for Dr. Hulan Saas.    CC: Shoulder pain follow-up  FXT:[id number]   11/25/2018 Patient given injection and tolerated the procedure well.  Patient had some mild increase in range of motion immediately.  We discussed with patient to continue with formal physical therapy, will continue all the other things such as vitamin D supplementation.  Patient will follow-up with me again 6 weeks.  Update 12/31/2018 Vicki [name] is a 58 y.o. female coming in with complaint of right shoulder pain. Patient states that the injection did help as is physical therapy. Is not 100%. ROM is better but is still limited at end ranges. Is sleeping better. Pain is intermittent and without a pattern.  Patient states that she is approximately 90% herself.  Very minimal discomfort from time to time and is still having difficulty with range of motion    Past Medical History:  Diagnosis Date  . Allergy   . Anxiety    hx ?panic disorder  . Asthma   . Celiac disease   . Chronic gastritis   . GERD (gastroesophageal reflux disease)   . Hyperplastic colon polyp   . Migraines    chronic  . Schatzki's ring   . Skin cancer    chin area  . Thyroid disease    Past Surgical History:  Procedure Laterality Date  . ABLATION  2012  . CESAREAN SECTION  1984  . COLONOSCOPY  2011  . DILATION AND CURETTAGE OF UTERUS  2012  . POLYPECTOMY    . TONSILLECTOMY AND ADENOIDECTOMY  1964  . TUBAL LIGATION  1998  . UPPER GASTROINTESTINAL ENDOSCOPY  2011,1998   Social History   Socioeconomic History  . Marital status: Married    Spouse name: Vicki [name]  . Number of children: 4  . Years of education: Not on file  . Highest education level: Not on file  Occupational History  . Occupation: retired Animal nutritionist  . Financial resource strain: Not on file  . Food  insecurity    Worry: Not on file    Inability: Not on file  . Transportation needs    Medical: Not on file    Non-medical: Not on file  Tobacco Use  . Smoking status: Never Smoker  . Smokeless tobacco: Never Used  Substance and Sexual Activity  . Alcohol use: [name]  . Drug use: [name]  . Sexual activity: Not on file  Lifestyle  . Physical activity    Days per week: Not on file    Minutes per session: Not on file  . Stress: Not on file  Relationships  . Social Herbalist on phone: Not on file    Gets together: Not on file    Attends religious service: Not on file    Active member of club or organization: Not on file    Attends meetings of clubs or organizations: Not on file    Relationship status: Not on file  Other Topics Concern  . Not on file  Social History Narrative  . Not on file   Allergies  Allergen Reactions  . Lamisil [Terbinafine] Hives  . Ceclor [Cefaclor]     hives  . Cephalosporins   . Latex   . Zoster Vaccine Live Rash   Family History  Problem Relation Age of Onset  . Alcohol abuse  Mother   . Cancer Mother        breast  . Alcohol abuse Father   . Stroke Father   . Asthma Paternal Aunt   . Colon cancer Paternal Aunt   . Arthritis Maternal Grandmother   . Cancer Sister 27       breast cancer  . Lupus Sister   . Irritable bowel syndrome Sister   . Colon cancer Paternal Grandfather   . Heart disease Neg Hx   . Esophageal cancer Neg Hx   . Stomach cancer Neg Hx   . Rectal cancer Neg Hx       Current Outpatient Medications (Cardiovascular):  .  nitroGLYCERIN (NITRODUR - DOSED IN MG/24 HR) 0.2 mg/hr patch, Place 1/4 to 1/2 of a patch over affected region. Remove and replace once daily.  Slightly alter skin placement daily (Patient not taking: Reported on 10/16/2018)   Current Outpatient Medications (Respiratory):  .  albuterol (PROVENTIL HFA;VENTOLIN HFA) 108 (90 Base) MCG/ACT inhaler, INHALE 2 PUFFS INTO THE LUNGS EVERY 4 (FOUR) HOURS AS  NEEDED FOR WHEEZING OR SHORTNESS OF BREATH. .  mometasone (NASONEX) 50 MCG/ACT nasal spray, USE 2 SPRAYS NASALLY EVERY DAY .  pseudoephedrine (SUDAFED) 30 MG tablet, Take by mouth.   Current Outpatient Medications (Analgesics):  .  meloxicam (MOBIC) 15 MG tablet, TAKE 1 TABLET BY MOUTH EVERY DAY .  SUMAtriptan (IMITREX) 100 MG tablet, TAKE 1 TABLET BY MOUTH EVERY 2 HOURS AS NEEDED FOR MIGRAINE     Current Outpatient Medications (Other):  .  baclofen (LIORESAL) 10 MG tablet, Take 10 mg by mouth every 8 (eight) hours as needed (for migraines). .  Diclofenac Sodium 2 % SOLN, Place 2 g onto the skin 2 (two) times daily. .  diphenoxylate-atropine (LOMOTIL) 2.5-0.025 MG tablet, Take 1 tablet by mouth 4 (four) times daily as needed for diarrhea or loose stools. Marland Kitchen  estradiol (ESTRACE) 0.1 MG/GM vaginal cream, Place vaginally. .  fluconazole (DIFLUCAN) 100 MG tablet, Take 1 tablet (100 mg total) by mouth daily. .  Multiple Vitamins-Minerals (WOMENS MULTIVITAMIN PLUS PO), Take by mouth daily. Marland Kitchen  PARoxetine (PAXIL) 10 MG tablet, TAKE 1 TABLET BY MOUTH EVERY DAY IN THE MORNING .  valACYclovir (VALTREX) 1000 MG tablet, Take two at onset of cold sore and repeat 2 in 12 hours. .  Vitamin D, Ergocalciferol, (DRISDOL) 1.25 MG (50000 UT) CAPS capsule, TAKE 1 CAPSULE (50,000 UNITS TOTAL) BY MOUTH EVERY 7 (SEVEN) DAYS. Marland Kitchen  ZENPEP 40000-126000 units CPEP, TAKE 2 CAPSULES BY MOUTH 3 TIMES DAILY WITH MEALS. AND 1 CAPSULE WITH EACH SNACK  Current Facility-Administered Medications (Other):  .  0.9 %  sodium chloride infusion    Past medical history, social, surgical and family history all reviewed in electronic medical record.  [name] pertanent information unless stated regarding to the chief complaint.   Review of Systems:  [name] headache, visual changes, nausea, vomiting, diarrhea, constipation, dizziness, abdominal pain, skin rash, fevers, chills, night sweats, weight loss, swollen lymph nodes, body aches, joint  swelling,chest pain, shortness of breath, mood changes.  Positive muscle aches  Objective  Blood pressure 110/72, pulse 74, height 5' 5"  (1.651 m), weight 121 lb (54.9 kg), SpO2 98 %.    General: [name] apparent distress alert and oriented x3 mood and affect normal, dressed appropriately.  HEENT: Pupils equal, extraocular movements intact  Respiratory: Patient's speak in full sentences and does not appear short of breath  Cardiovascular: [name] lower extremity edema, non tender, [name] erythema  Skin:  Warm dry intact with [name] signs of infection or rash on extremities or on axial skeleton.  Abdomen: Soft nontender  Neuro: Cranial nerves II through XII are intact, neurovascularly intact in all extremities with 2+ DTRs and 2+ pulses.  Lymph: [name] lymphadenopathy of posterior or anterior cervical chain or axillae bilaterally.  Gait normal with good balance and coordination.  MSK:  Non tender with full range of motion and good stability and symmetric strength and tone of  elbows, wrist, hip, knee and ankles bilaterally.  Right shoulder exam shows the patient has significant improvement with full forward flexion, patient is still having some difficulty with internal rotation to sacrum but otherwise near full range of motion.  Rotator cuff strength 5 out of 5.     Impression and Recommendations:      The above documentation has been reviewed and is accurate and complete Lyndal Pulley, DO       Note: This dictation was prepared with Dragon dictation along with smaller phrase technology. Any transcriptional errors that result from this process are unintentional.

## 2018-12-31 NOTE — Assessment & Plan Note (Signed)
Patient is doing significantly better at this time.  Discussed with patient to continue with range of motion.  We discussed that we can repeat the possibility of repeating the injection if necessary but would like to hold off at this time with patient making significant improvement.  Patient will follow-up with me again 4 to 8 weeks.

## 2019-01-06 ENCOUNTER — Other Ambulatory Visit: Payer: Self-pay

## 2019-01-06 ENCOUNTER — Encounter: Payer: Self-pay | Admitting: Physical Therapy

## 2019-01-06 ENCOUNTER — Ambulatory Visit: Payer: BC Managed Care – PPO | Attending: Family Medicine | Admitting: Physical Therapy

## 2019-01-06 DIAGNOSIS — G8929 Other chronic pain: Secondary | ICD-10-CM

## 2019-01-06 DIAGNOSIS — M6281 Muscle weakness (generalized): Secondary | ICD-10-CM | POA: Insufficient documentation

## 2019-01-06 DIAGNOSIS — M25611 Stiffness of right shoulder, not elsewhere classified: Secondary | ICD-10-CM | POA: Diagnosis not present

## 2019-01-06 DIAGNOSIS — M25511 Pain in right shoulder: Secondary | ICD-10-CM | POA: Diagnosis not present

## 2019-01-06 NOTE — Therapy (Signed)
[facility] [address], [place], [place], [address] Phone: [phone]   Fax:  [phone]  Physical Therapy Treatment/Discharge Summary   Patient Details  Name: NIKIAH GOIN MRN: [id number] Date of Birth: 06/30/1960 Referring Provider (PT): Dr. Charlann Boxer    Encounter Date: 01/06/2019  PT End of Session - 01/06/19 2226    Visit Number  13    Number of Visits  28    Date for PT Re-Evaluation  02/03/19    Authorization Type  [employer]--per [place]  2 used of 30 visits     PT Start Time  0957    PT Stop Time  1045    PT Time Calculation (min)  48 min    Activity Tolerance  Patient tolerated treatment well       Past Medical History:  Diagnosis Date  . Allergy   . Anxiety    hx ?panic disorder  . Asthma   . Celiac disease   . Chronic gastritis   . GERD (gastroesophageal reflux disease)   . Hyperplastic colon polyp   . Migraines    chronic  . Schatzki's ring   . Skin cancer    chin area  . Thyroid disease     Past Surgical History:  Procedure Laterality Date  . ABLATION  2012  . CESAREAN SECTION  1984  . COLONOSCOPY  2011  . DILATION AND CURETTAGE OF UTERUS  2012  . POLYPECTOMY    . TONSILLECTOMY AND ADENOIDECTOMY  1964  . TUBAL LIGATION  1998  . UPPER GASTROINTESTINAL ENDOSCOPY  0600,4599    There were no vitals filed for this visit.  Subjective Assessment - 01/06/19 0959    Subjective  Appt with Dr. Tamala Julian went well.  He wants this to be my last day of PT.  He thinks I'll be better by Labor Day.  It did wake me up last night but it comes and goes.    Pertinent History  celiac disease which affects nutrient absorption;  hx of left frozen shoulder 10-12 years ago took a long time to resolve attributed to flu shot, had PT and 2 rounds of dose pack     Diagnostic tests  xray:  subtle fracture superior humeral head with a subacute appearance;  referred for bone density     Patient Stated Goals  be able  to sleep the night without waking in pain;  not have pain all the time; both sides equal ROM          [facility] PT Assessment - 01/06/19 0001      Observation/Other Assessments   Focus on Therapeutic Outcomes (FOTO)   20% limitation       AROM   Right Shoulder Flexion  153 Degrees    Right Shoulder ABduction  166 Degrees    Right Shoulder Internal Rotation  --   L1   Right Shoulder External Rotation  80 Degrees      Strength   Right Shoulder Flexion  4/5    Right Shoulder Extension  4+/5    Right Shoulder ABduction  4-/5    Right Shoulder Internal Rotation  4/5    Right Shoulder External Rotation  4/5                   [facility] Adult PT Treatment/Exercise - 01/06/19 0001      Shoulder Exercises: Seated   Other Seated Exercises  review of HEP with discussion of areas of  focus      Shoulder Exercises: ROM/Strengthening   Ranger  on wall L28 10x flexion and scaption       Moist Heat Therapy   Number Minutes Moist Heat  10 Minutes   concurrent with manual therapy    Moist Heat Location  Shoulder      Manual Therapy   Manual therapy comments  scapular mobs in sidelying and shoulder internal rotation     Joint Mobilization  glenohumeral joint distraction, grade 3 inferior and posterior mobs; inferior mobs with movement;  mobs with movement in varying planes of motion     Soft tissue mobilization  gentle pectoral stretch     Scapular Mobilization  medial and lateral scapular glides                PT Short Term Goals - 01/06/19 2231      PT SHORT TERM GOAL #1   Title  The patient will demonstrate understand initial ROM ex HEP on a low load but frequent basis    Status  Achieved      PT SHORT TERM GOAL #2   Title  The patient will report a 25% improvement in pain with dressing and grooming tasks    Status  Achieved      PT SHORT TERM GOAL #3   Title  The patient will have improved flexion to 135 and abduction to 95 degrees needed for reaching, household  chores and yardwork     Status  Achieved      PT SHORT TERM GOAL #4   Title  The patient will have improved internal rotation behind back to L4-5 region needed for dressing and toileting    Status  Achieved        PT Long Term Goals - 01/06/19 2231      PT LONG TERM GOAL #1   Title  The patient will be independent in HEP needed for further improvements in ROM and strength    Status  Achieved      PT LONG TERM GOAL #2   Title  The patient will report a 50% improvement in shoulder pain with dressing, grooming and household activities    Status  Achieved      PT LONG TERM GOAL #3   Title  The patient will have improved UE elevation to 155 degrees needed for reaching overhead    Status  Achieved      PT LONG TERM GOAL #4   Title  Internal rotation behind the back to L2-3 needed for tucking in her shirt, toileting    Status  Achieved      PT LONG TERM GOAL #5   Title  Right shoulder/periscapular strength improved to grossly to 3+/5 to 4-/5 needed for household activities    Status  Achieved      PT LONG TERM GOAL #6   Title  FOTO functional outcome score improved from 58% limitation to 35%    Status  Achieved      PT LONG TERM GOAL #7   Title  Patient will be able to clean shower door with improved motor control and ROM ease and minimal discomfort.    Status  Achieved            Plan - 01/06/19 2226    Clinical Impression Statement  The patient has made excellent improvements in pain reduction, ROM, strength and return to function.  She is sleeping much better overall and is able to use  her right UE to lift a gallon of milk or clean the shower door with little difficulty.  She has been highly compliant with her HEP and we have discussed a safe self progression.  She has met all rehab goals and recommend discharge from PT at this time.    Comorbidities  celiacs disease which affects nutrient absorption and may affect bone density; subacute fracture;   history of left frozen  shoulder;  age and gender increased risk for adhesive capsulitis     Examination-Participation Restrictions  Laundry;Cleaning;Meal Prep;Yard Work;Other    Rehab Potential  Good       Patient will benefit from skilled therapeutic intervention in order to improve the following deficits and impairments:  Pain, Decreased activity tolerance, Decreased range of motion, Decreased strength, Impaired UE functional use  Visit Diagnosis: 1. Chronic right shoulder pain   2. Stiffness of right shoulder, not elsewhere classified   3. Muscle weakness (generalized)     PHYSICAL THERAPY DISCHARGE SUMMARY  Visits from Start of Care: 13  Current functional level related to goals / functional outcomes: See clinical impressions above   Remaining deficits: As above   Education / Equipment: Comprehensive HEP  Plan: Patient agrees to discharge.  Patient goals were met. Patient is being discharged due to meeting the stated rehab goals.  ?????          Problem List Patient Active Problem List   Diagnosis Date Noted  . Frozen shoulder 10/14/2018  . Gastroesophageal reflux disease without esophagitis 11/09/2015  . Belching 11/09/2015  . Loose stools 11/09/2015  . History of migraine headaches 01/09/2013  . Asthma, mild intermittent 01/31/2012  . GERD (gastroesophageal reflux disease) 01/31/2012  . Celiac disease 01/31/2012  . Hypothyroid 01/31/2012  . Panic disorder 01/31/2012   Ruben Im, PT 01/06/19 10:34 PM Phone: [phone] Fax: [phone] Alvera Singh 01/06/2019, 10:32 PM  [facility] [address], [place], [place], [address] Phone: [phone]   Fax:  [phone]  Name: RUTHANN ANGULO MRN: [id number] Date of Birth: [birth year]

## 2019-01-12 ENCOUNTER — Ambulatory Visit (INDEPENDENT_AMBULATORY_CARE_PROVIDER_SITE_OTHER): Payer: BC Managed Care – PPO | Admitting: Family Medicine

## 2019-01-12 ENCOUNTER — Other Ambulatory Visit: Payer: Self-pay

## 2019-01-12 ENCOUNTER — Encounter: Payer: Self-pay | Admitting: Family Medicine

## 2019-01-12 VITALS — BP 116/72 | HR 59 | Temp 98.3°F | Ht 65.0 in | Wt 120.2 lb

## 2019-01-12 DIAGNOSIS — L237 Allergic contact dermatitis due to plants, except food: Secondary | ICD-10-CM

## 2019-01-12 MED ORDER — PREDNISONE 10 MG PO TABS: ORAL_TABLET | ORAL | 0 refills | Status: DC

## 2019-01-12 NOTE — Patient Instructions (Signed)
Contact Dermatitis Dermatitis is redness, soreness, and swelling (inflammation) of the skin. Contact dermatitis is a reaction to certain substances that touch the skin. Many different substances can cause contact dermatitis. There are two types of contact dermatitis:  Irritant contact dermatitis. This type is caused by something that irritates your skin, such as having dry hands from washing them too often with soap. This type does not require previous exposure to the substance for a reaction to occur. This is the most common type.  Allergic contact dermatitis. This type is caused by a substance that you are allergic to, such as poison ivy. This type occurs when you have been exposed to the substance (allergen) and develop a sensitivity to it. Dermatitis may develop soon after your first exposure to the allergen, or it may not develop until the next time you are exposed and every time thereafter. What are the causes? Irritant contact dermatitis is most commonly caused by exposure to:  Makeup.  Soaps.  Detergents.  Bleaches.  Acids.  Metal salts, such as nickel. Allergic contact dermatitis is most commonly caused by exposure to:  Poisonous plants.  Chemicals.  Jewelry.  Latex.  Medicines.  Preservatives in products, such as clothing. What increases the risk? You are more likely to develop this condition if you have:  A job that exposes you to irritants or allergens.  Certain medical conditions, such as asthma or eczema. What are the signs or symptoms? Symptoms of this condition may occur on your body anywhere the irritant has touched you or is touched by you.  Symptoms include: ? Dryness or flaking. ? Redness. ? Cracks. ? Itching. ? Pain or a burning feeling. ? Blisters. ? Drainage of small amounts of blood or clear fluid from skin cracks. With allergic contact dermatitis, there may also be swelling in areas such as the eyelids, mouth, or genitals. How is this  diagnosed? This condition is diagnosed with a medical history and physical exam.  A patch skin test may be performed to help determine the cause.  If the condition is related to your job, you may need to see an occupational medicine specialist. How is this treated? This condition is treated by checking for the cause of the reaction and protecting your skin from further contact. Treatment may also include:  Steroid creams or ointments. Oral steroid medicines may be needed in more severe cases.  Antibiotic medicines or antibacterial ointments, if a skin infection is present.  Antihistamine lotion or an antihistamine taken by mouth to ease itching.  A bandage (dressing). Follow these instructions at home: Skin care  Moisturize your skin as needed.  Apply cool compresses to the affected areas.  Try applying baking soda paste to your skin. Stir water into baking soda until it reaches a paste-like consistency.  Do not scratch your skin, and avoid friction to the affected area.  Avoid the use of soaps, perfumes, and dyes. Medicines  Take or apply over-the-counter and prescription medicines only as told by your health care provider.  If you were prescribed an antibiotic medicine, take or apply the antibiotic as told by your health care provider. Do not stop using the antibiotic even if your condition improves. Bathing  Try taking a bath with: ? Epsom salts. Follow the instructions on the packaging. You can get these at your local pharmacy or grocery store. ? Baking soda. Pour a small amount into the bath as directed by your health care provider. ? Colloidal oatmeal. Follow the instructions on the  packaging. You can get this at your local pharmacy or grocery store.  Bathe less frequently, such as every other day.  Bathe in lukewarm water. Avoid using hot water. Bandage care  If you were given a bandage (dressing), change it as told by your health care provider.  Wash your hands  with soap and water before and after you change your dressing. If soap and water are not available, use hand sanitizer. General instructions  Avoid the substance that caused your reaction. If you do not know what caused it, keep a journal to try to track what caused it. Write down: ? What you eat. ? What cosmetic products you use. ? What you drink. ? What you wear in the affected area. This includes jewelry.  More redness, swelling, or pain.  More fluid or blood.  Warmth.  Pus or a bad smell.  Keep all follow-up visits as told by your health care provider. This is important. Contact a health care provider if:  Your condition does not improve with treatment.  Your condition gets worse.  You have signs of infection such as swelling, tenderness, redness, soreness, or warmth in the affected area.  You have a fever.  You have new symptoms. Get help right away if:  You have a severe headache, neck pain, or neck stiffness.  You vomit.  You feel very sleepy.  You notice red streaks coming from the affected area.  Your bone or joint underneath the affected area becomes painful after the skin has healed.  The affected area turns darker.  You have difficulty breathing. Summary  Dermatitis is redness, soreness, and swelling (inflammation) of the skin. Contact dermatitis is a reaction to certain substances that touch the skin.  Symptoms of this condition may occur on your body anywhere the irritant has touched you or is touched by you.  This condition is treated by figuring out what caused the reaction and protecting your skin from further contact. Treatment may also include medicines and skin care.  Avoid the substance that caused your reaction. If you do not know what caused it, keep a journal to try to track what caused it.  Contact a health care provider if your condition gets worse or you have signs of infection such as swelling, tenderness, redness, soreness, or warmth  in the affected area. This information is not intended to replace advice given to you by your health care provider. Make sure you discuss any questions you have with your health care provider. Document Released: 05/11/2000 Document Revised: 09/03/2018 Document Reviewed: 11/27/2017 Elsevier Patient Education  2020 [employer].

## 2019-01-12 NOTE — Progress Notes (Signed)
  Subjective:     Patient ID: Vicki [name], female   DOB: Oct 06, 1960, 58 y.o.   MRN: [id number]  HPI  Patient is seen with pruritic blistery rash right side of face and right thigh which started last Thursday.  She has done a lot of yard work recently.  Exacerbated by the heat.  She is continue to get some small patches of outbreak since onset.  No fever.  Rash is nonpainful.  Moderate pruritus to severe at times  Past Medical History:  Diagnosis Date  . Allergy   . Anxiety    hx ?panic disorder  . Asthma   . Celiac disease   . Chronic gastritis   . GERD (gastroesophageal reflux disease)   . Hyperplastic colon polyp   . Migraines    chronic  . Schatzki's ring   . Skin cancer    chin area  . Thyroid disease    Past Surgical History:  Procedure Laterality Date  . ABLATION  2012  . CESAREAN SECTION  1984  . COLONOSCOPY  2011  . DILATION AND CURETTAGE OF UTERUS  2012  . POLYPECTOMY    . TONSILLECTOMY AND ADENOIDECTOMY  1964  . TUBAL LIGATION  1998  . UPPER GASTROINTESTINAL ENDOSCOPY  2011,1998    reports that she has never smoked. She has never used smokeless tobacco. She reports that she does not drink alcohol or use drugs. family history includes Alcohol abuse in her father and mother; Arthritis in her maternal grandmother; Asthma in her paternal aunt; Cancer in her mother; Cancer (age of onset: 34) in her sister; Colon cancer in her paternal aunt and paternal grandfather; Irritable bowel syndrome in her sister; Lupus in her sister; Stroke in her father. Allergies  Allergen Reactions  . Lamisil [Terbinafine] Hives  . Ceclor [Cefaclor]     hives  . Cephalosporins   . Latex   . Zoster Vaccine Live Rash    Review of Systems  Constitutional: Negative for chills and fever.  Skin: Positive for rash.       Objective:   Physical Exam Constitutional:      Appearance: Normal appearance.  Cardiovascular:     Rate and Rhythm: Normal rate and regular rhythm.  Pulmonary:      Effort: Pulmonary effort is normal.     Breath sounds: Normal breath sounds.  Skin:    Comments: Patient has some scattered vesicular rash right thigh and right side of face consistent with contact dermatitis.  Neurological:     Mental Status: She is alert.        Assessment:     Contact dermatitis    Plan:     -Prednisone taper starting at 40 mg daily -Follow-up for any persistent or worsening symptoms  Eulas Post MD [facility]

## 2019-01-16 ENCOUNTER — Other Ambulatory Visit: Payer: Self-pay | Admitting: Family Medicine

## 2019-01-30 ENCOUNTER — Ambulatory Visit: Payer: BC Managed Care – PPO | Admitting: Internal Medicine

## 2019-02-17 ENCOUNTER — Other Ambulatory Visit: Payer: Self-pay

## 2019-02-17 ENCOUNTER — Encounter: Payer: Self-pay | Admitting: Family Medicine

## 2019-02-17 ENCOUNTER — Ambulatory Visit (INDEPENDENT_AMBULATORY_CARE_PROVIDER_SITE_OTHER): Payer: BC Managed Care – PPO | Admitting: Family Medicine

## 2019-02-17 ENCOUNTER — Ambulatory Visit: Payer: Self-pay

## 2019-02-17 VITALS — BP 102/62 | HR 75 | Ht 65.0 in | Wt 121.0 lb

## 2019-02-17 DIAGNOSIS — M25511 Pain in right shoulder: Secondary | ICD-10-CM | POA: Diagnosis not present

## 2019-02-17 DIAGNOSIS — M7501 Adhesive capsulitis of right shoulder: Secondary | ICD-10-CM | POA: Diagnosis not present

## 2019-02-17 DIAGNOSIS — G8929 Other chronic pain: Secondary | ICD-10-CM | POA: Diagnosis not present

## 2019-02-17 DIAGNOSIS — M25519 Pain in unspecified shoulder: Secondary | ICD-10-CM | POA: Insufficient documentation

## 2019-02-17 NOTE — Assessment & Plan Note (Addendum)
Patient given injection today.  Tolerated the procedure well.  Discussed icing regimen and home exercise, discussed which activities to do which wants to avoid.  Follow-up again in 4 weeks

## 2019-02-17 NOTE — Assessment & Plan Note (Signed)
Patient's frozen shoulder seems to be improving significantly.  Had more of an acromioclavicular swelling that seem to be contributing and given acromioclavicular injection today.  Discussed with patient in great length about icing regimen and home exercises, we discussed avoiding certain activities.  Follow-up with me again in 4 to 8 weeks

## 2019-02-17 NOTE — Progress Notes (Signed)
[name] [address] [place], [place] [address] Phone: [phone] Subjective:   [name], am serving as a scribe for Dr. Hulan Saas.    CC: Right shoulder pain follow-up  [id number]   12/31/2018 Patient is doing significantly better at this time.  Discussed with patient to continue with range of motion.  We discussed that we can repeat the possibility of repeating the injection if necessary but would like to hold off at this time with patient making significant improvement.  Patient will follow-up with me again 4 to 8 weeks.  Update 02/17/2019 Vicki [name] is a 58 y.o. female coming in with complaint of right shoulder pain. Patient states that her shoulder has been doing well since last visit. Has been able to paddle board. Has good and bad days. Weather changes causes soreness which subsides once she moves her arm around.       Past Medical History:  Diagnosis Date  . Allergy   . Anxiety    hx ?panic disorder  . Asthma   . Celiac disease   . Chronic gastritis   . GERD (gastroesophageal reflux disease)   . Hyperplastic colon polyp   . Migraines    chronic  . Schatzki's ring   . Skin cancer    chin area  . Thyroid disease    Past Surgical History:  Procedure Laterality Date  . ABLATION  2012  . CESAREAN SECTION  1984  . COLONOSCOPY  2011  . DILATION AND CURETTAGE OF UTERUS  2012  . POLYPECTOMY    . TONSILLECTOMY AND ADENOIDECTOMY  1964  . TUBAL LIGATION  1998  . UPPER GASTROINTESTINAL ENDOSCOPY  2011,1998   Social History   Socioeconomic History  . Marital status: Married    Spouse name: Carlis Abbott  . Number of children: 4  . Years of education: Not on file  . Highest education level: Not on file  Occupational History  . Occupation: retired Animal nutritionist  . Financial resource strain: Not on file  . Food insecurity    Worry: Not on file    Inability: Not on file  . Transportation needs    Medical: Not on  file    Non-medical: Not on file  Tobacco Use  . Smoking status: Never Smoker  . Smokeless tobacco: Never Used  Substance and Sexual Activity  . Alcohol use: No  . Drug use: No  . Sexual activity: Not on file  Lifestyle  . Physical activity    Days per week: Not on file    Minutes per session: Not on file  . Stress: Not on file  Relationships  . Social Herbalist on phone: Not on file    Gets together: Not on file    Attends religious service: Not on file    Active member of club or organization: Not on file    Attends meetings of clubs or organizations: Not on file    Relationship status: Not on file  Other Topics Concern  . Not on file  Social History Narrative  . Not on file   Allergies  Allergen Reactions  . Lamisil [Terbinafine] Hives  . Ceclor [Cefaclor]     hives  . Cephalosporins   . Latex   . Zoster Vaccine Live Rash   Family History  Problem Relation Age of Onset  . Alcohol abuse Mother   . Cancer Mother  breast  . Alcohol abuse Father   . Stroke Father   . Asthma Paternal Aunt   . Colon cancer Paternal Aunt   . Arthritis Maternal Grandmother   . Cancer Sister 52       breast cancer  . Lupus Sister   . Irritable bowel syndrome Sister   . Colon cancer Paternal Grandfather   . Heart disease Neg Hx   . Esophageal cancer Neg Hx   . Stomach cancer Neg Hx   . Rectal cancer Neg Hx     Current Outpatient Medications (Endocrine & Metabolic):  .  predniSONE (DELTASONE) 10 MG tablet, Taper as follows: 4-4-4-4-3-3-3-2-2-1-1     Current Outpatient Medications (Respiratory):  .  albuterol (PROVENTIL HFA;VENTOLIN HFA) 108 (90 Base) MCG/ACT inhaler, INHALE 2 PUFFS INTO THE LUNGS EVERY 4 (FOUR) HOURS AS NEEDED FOR WHEEZING OR SHORTNESS OF BREATH. .  mometasone (NASONEX) 50 MCG/ACT nasal spray, USE 2 SPRAYS NASALLY EVERY DAY .  pseudoephedrine (SUDAFED) 30 MG tablet, Take by mouth.   Current Outpatient Medications (Analgesics):  .   meloxicam (MOBIC) 15 MG tablet, TAKE 1 TABLET BY MOUTH EVERY DAY .  SUMAtriptan (IMITREX) 100 MG tablet, TAKE 1 TABLET BY MOUTH EVERY 2 HOURS AS NEEDED FOR MIGRAINE     Current Outpatient Medications (Other):  .  baclofen (LIORESAL) 10 MG tablet, Take 10 mg by mouth every 8 (eight) hours as needed (for migraines). .  Diclofenac Sodium 2 % SOLN, Place 2 g onto the skin 2 (two) times daily. .  diphenoxylate-atropine (LOMOTIL) 2.5-0.025 MG tablet, Take 1 tablet by mouth 4 (four) times daily as needed for diarrhea or loose stools. Marland Kitchen  estradiol (ESTRACE) 0.1 MG/GM vaginal cream, Place vaginally. .  Multiple Vitamins-Minerals (WOMENS MULTIVITAMIN PLUS PO), Take by mouth daily. Marland Kitchen  PARoxetine (PAXIL) 10 MG tablet, TAKE 1 TABLET BY MOUTH EVERY DAY IN THE MORNING .  valACYclovir (VALTREX) 1000 MG tablet, Take two at onset of cold sore and repeat 2 in 12 hours. .  Vitamin D, Ergocalciferol, (DRISDOL) 1.25 MG (50000 UT) CAPS capsule, TAKE 1 CAPSULE (50,000 UNITS TOTAL) BY MOUTH EVERY 7 (SEVEN) DAYS. Marland Kitchen  ZENPEP 40000-126000 units CPEP, TAKE 2 CAPSULES BY MOUTH 3 TIMES DAILY WITH MEALS. AND 1 CAPSULE WITH EACH SNACK  Current Facility-Administered Medications (Other):  .  0.9 %  sodium chloride infusion    Past medical history, social, surgical and family history all reviewed in electronic medical record.  No pertanent information unless stated regarding to the chief complaint.   Review of Systems:  No headache, visual changes, nausea, vomiting, diarrhea, constipation, dizziness, abdominal pain, skin rash, fevers, chills, night sweats, weight loss, swollen lymph nodes, body aches, joint swelling,  chest pain, shortness of breath, mood changes.  Positive muscle aches  Objective  Blood pressure 102/62, pulse 75, height 5' 5"  (1.651 m), weight 121 lb (54.9 kg), SpO2 99 %. Has had no   General: No apparent distress alert and oriented x3 mood and affect normal, dressed appropriately.  HEENT: Pupils equal,  extraocular movements intact  Respiratory: Patient's speak in full sentences and does not appear short of breath  Cardiovascular: No lower extremity edema, non tender, no erythema  Skin: Warm dry intact with no signs of infection or rash on extremities or on axial skeleton.  Abdomen: Soft nontender  Neuro: Cranial nerves II through XII are intact, neurovascularly intact in all extremities with 2+ DTRs and 2+ pulses.  Lymph: No lymphadenopathy of posterior or anterior cervical chain or  axillae bilaterally.  Gait normal with good balance and coordination.  MSK:  Non tender with full range of motion and good stability and symmetric strength and tone of  elbows, wrist, hip, knee and ankles bilaterally.  Shoulder: Right Inspection reveals no abnormalities, atrophy or asymmetry. Palpation is normal with no tenderness over AC joint or bicipital groove. ROM is decreased in all planes but improved from previous exam Rotator cuff strength normal throughout. Positive impingement Speeds and Yergason's tests normal. Positive crossover Normal scapular function observed. No painful arc and no drop arm sign. No apprehension sign Contralateral shoulder unremarkable  Procedure: Real-time Ultrasound Guided Injection of right acromioclavicular joint Device: GE Logiq Q7 Ultrasound guided injection is preferred based studies that show increased duration, increased effect, greater accuracy, decreased procedural pain, increased response rate, and decreased cost with ultrasound guided versus blind injection.  Verbal informed consent obtained.  Time-out conducted.  Noted no overlying erythema, induration, or other signs of local infection.  Skin prepped in a sterile fashion.  Local anesthesia: Topical Ethyl chloride.  With sterile technique and under real time ultrasound guidance: With a 25-gauge 1 inch needle injected with 0.5 cc of 0.5% Marcaine and 0.5 cc of Kenalog 40 mg/mL Completed without difficulty   Pain immediately resolved suggesting accurate placement of the medication.  Advised to call if fevers/chills, erythema, induration, drainage, or persistent bleeding.  Images permanently stored and available for review in the ultrasound unit.  Impression: Technically successful ultrasound guided injection.     Impression and Recommendations:     This case required medical decision making of moderate complexity. The above documentation has been reviewed and is accurate and complete Lyndal Pulley, DO       Note: This dictation was prepared with Dragon dictation along with smaller phrase technology. Any transcriptional errors that result from this process are unintentional.

## 2019-02-19 DIAGNOSIS — G43709 Chronic migraine without aura, not intractable, without status migrainosus: Secondary | ICD-10-CM | POA: Insufficient documentation

## 2019-02-19 DIAGNOSIS — G43109 Migraine with aura, not intractable, without status migrainosus: Secondary | ICD-10-CM | POA: Diagnosis not present

## 2019-02-23 ENCOUNTER — Encounter: Payer: Self-pay | Admitting: Family Medicine

## 2019-02-26 ENCOUNTER — Ambulatory Visit: Payer: Self-pay | Admitting: Podiatry

## 2019-02-26 ENCOUNTER — Other Ambulatory Visit: Payer: Self-pay | Admitting: Family Medicine

## 2019-03-24 ENCOUNTER — Ambulatory Visit (INDEPENDENT_AMBULATORY_CARE_PROVIDER_SITE_OTHER): Payer: BC Managed Care – PPO | Admitting: Internal Medicine

## 2019-03-24 ENCOUNTER — Other Ambulatory Visit: Payer: Self-pay

## 2019-03-24 ENCOUNTER — Encounter: Payer: Self-pay | Admitting: Internal Medicine

## 2019-03-24 VITALS — BP 112/64 | HR 80 | Temp 97.8°F | Ht 65.0 in | Wt 119.0 lb

## 2019-03-24 DIAGNOSIS — K8681 Exocrine pancreatic insufficiency: Secondary | ICD-10-CM

## 2019-03-24 DIAGNOSIS — K900 Celiac disease: Secondary | ICD-10-CM | POA: Diagnosis not present

## 2019-03-24 DIAGNOSIS — K589 Irritable bowel syndrome without diarrhea: Secondary | ICD-10-CM | POA: Diagnosis not present

## 2019-03-24 MED ORDER — ZENPEP 40000-126000 UNITS PO CPEP: 2.0000 | ORAL_CAPSULE | Freq: Three times a day (TID) | ORAL | 3 refills | Status: DC

## 2019-03-24 NOTE — Patient Instructions (Signed)
We have sent the following medications to your pharmacy for you to pick up at your convenience: Zenpep 40,000 units-2 capsules with meals, 1 capsule with snacks  Please follow up with Dr Hilarie Fredrickson in 1 year.  If you are age 59 or older, your body mass index should be between 23-30. Your Body mass index is 19.8 kg/m. If this is out of the aforementioned range listed, please consider follow up with your Primary Care Provider.  If you are age 77 or younger, your body mass index should be between 19-25. Your Body mass index is 19.8 kg/m. If this is out of the aformentioned range listed, please consider follow up with your Primary Care Provider.

## 2019-03-24 NOTE — Progress Notes (Signed)
Subjective:    Patient ID: Vicki [name], female    DOB: Feb 14, 1961, 58 y.o.   MRN: [id number]  HPI Vicki [name] is a 58 year old female with PMH of celiac disease, IBS, EPI, hypothyroidism, migraines who is here for follow-up.  She is here alone today and was seen on 01/14/2018.  At her visit last year she was having, despite strict gluten-free diet, abdominal bloating borborygmi and alternating bowel movements with abdominal cramping and discomfort.  We started her on pancreatic enzyme replacement with Zenpep and this has been tremendously helpful.  Zenpep has worked much better for her than Creon as with Creon she developed an uncomfortable cramping mid abdominal discomfort.  She is taking 1 to 2 tablets with meals and 1 with snack.  She reports her digestion is much better.  She continues strict gluten-free diet.  Her bowel movements do vary at times they can be small "pebbles" and at other times they can be loose for no real white rhyme or reason.  She rarely has true diarrhea and may use Lomotil 2 or 3 times per month.  She avoid stool softeners as this causes increased gas and fecal smearing.  She also avoids dairy as this makes her have more bloating and belching.  No blood in her stool or melena.  Stable weight.  No issue with significant heartburn or dysphagia.    Review of Systems As per HPI, otherwise negative  Current Medications, Allergies, Past Medical History, Past Surgical History, Family History and Social History were reviewed in [employer] [employer] record.     Objective:   Physical Exam BP 112/64 (BP Location: Left Arm, Patient Position: Sitting, Cuff Size: Normal)   Pulse 80   Temp 97.8 F (36.6 C) (Other (Comment))   Ht 5' 5"  (1.651 m)   Wt 119 lb (54 kg)   BMI 19.80 kg/m  Gen: awake, alert, NAD HEENT: anicteric CV: RRR, no mrg Pulm: CTA b/l Abd: soft, NT/ND, +BS throughout Ext: no c/c/e Neuro: nonfocal  CBC    Component Value Date/Time    WBC 5.0 10/23/2018 1523   RBC 3.70 (L) 10/23/2018 1523   HGB 12.0 10/23/2018 1523   HCT 34.5 (L) 10/23/2018 1523   PLT 185.0 10/23/2018 1523   MCV 93.3 10/23/2018 1523   MCHC 34.7 10/23/2018 1523   RDW 12.7 10/23/2018 1523   LYMPHSABS 1.4 10/23/2018 1523   MONOABS 0.3 10/23/2018 1523   EOSABS 0.4 10/23/2018 1523   BASOSABS 0.1 10/23/2018 1523   CMP     Component Value Date/Time   NA 141 10/23/2018 1523   K 3.7 10/23/2018 1523   CL 104 10/23/2018 1523   CO2 30 10/23/2018 1523   GLUCOSE 116 (H) 10/23/2018 1523   BUN 8 10/23/2018 1523   CREATININE 1.07 10/23/2018 1523   CALCIUM 9.5 10/23/2018 1523   CALCIUM 9.6 10/23/2018 1523   PROT 6.5 10/23/2018 1523   ALBUMIN 4.2 10/23/2018 1523   AST 30 10/23/2018 1523   ALT 27 10/23/2018 1523   ALKPHOS 48 10/23/2018 1523   BILITOT 0.4 10/23/2018 1523       Assessment & Plan:  58 year old female with PMH of celiac disease, IBS, EPI, hypothyroidism, migraines who is here for follow-up.  1.  Celiac disease --well-controlled on strict gluten-free diet.  2.  Exocrine pancreatic insufficiency --diagnosis confirmed with her excellent response to Zenpep.  We will have her continue Zenpep 40,000 units 1 to 2 capsules with meals, 1  with snacks.  3.  IBS --she does have overlapping irritable bowel on top of her celiac disease and exocrine pancreatic insufficiency.  She is managing this well.  She will continue Lomotil 1 tablet 3 times daily on an as-needed basis.  She is needing this very infrequently which is very positive.  4.  CRC screening --she is up-to-date with screening colonoscopy.  She had a colonoscopy in August 2017 with removal of a hyperplastic polyp only.  She should continue multivitamin supplementation including vitamin D  Annual follow-up, sooner if needed

## 2019-04-10 ENCOUNTER — Encounter: Payer: Self-pay | Admitting: *Deleted

## 2019-04-14 ENCOUNTER — Ambulatory Visit: Payer: BC Managed Care – PPO | Admitting: Family Medicine

## 2019-05-12 ENCOUNTER — Encounter: Payer: Self-pay | Admitting: Podiatry

## 2019-05-12 ENCOUNTER — Other Ambulatory Visit: Payer: Self-pay

## 2019-05-12 ENCOUNTER — Ambulatory Visit (INDEPENDENT_AMBULATORY_CARE_PROVIDER_SITE_OTHER): Payer: BC Managed Care – PPO

## 2019-05-12 ENCOUNTER — Ambulatory Visit (INDEPENDENT_AMBULATORY_CARE_PROVIDER_SITE_OTHER): Payer: BC Managed Care – PPO | Admitting: Podiatry

## 2019-05-12 ENCOUNTER — Other Ambulatory Visit: Payer: Self-pay | Admitting: Podiatry

## 2019-05-12 DIAGNOSIS — S82831A Other fracture of upper and lower end of right fibula, initial encounter for closed fracture: Secondary | ICD-10-CM

## 2019-05-12 DIAGNOSIS — S93401A Sprain of unspecified ligament of right ankle, initial encounter: Secondary | ICD-10-CM

## 2019-05-13 NOTE — Progress Notes (Signed)
She states that she fell on Saturday about 3 days ago or so I twisted her ankle states that the fifth toe is still numb and the whole foot is sort of bruised most of the pain is laterally.  Has been wearing ankle brace.  ROS: Denies fever chills nausea vomiting muscle aches pains calf pain back pain chest pain shortness of breath.  Objective: Vital signs are stable she is alert and oriented x3 presents ambulating with ankle brace right lower extremity weightbearing.  Pulses are strongly palpable neurologic sensorium is intact with exception of the sural distribution of the right foot.  Starts at the level of the ankle.  She has good abduction against resistance inversion against resistance dorsiflexion and plantarflexion.  She has pinpoint tenderness overlying the distal aspect of the fibula with ecchymosis inferior to that and inferior to the medial malleolus as well.  No erythema cellulitis drainage or odor it is mild to moderate edema across the foot and ankle.  Radiographs taken today demonstrate an osseously mature individual soft tissue swelling of the ankle.  Evaluation of the distalmost aspect of the fibula demonstrates a mildly displaced avulsion fracture of the distal fibula.  Assessment: Avulsion fracture distal fibula.  Plan: Discussed etiology pathology and surgical therapies placed her in a cam boot compression anklet and I will follow-up with her in 1 month.

## 2019-05-14 ENCOUNTER — Encounter: Payer: Self-pay | Admitting: Podiatry

## 2019-05-25 ENCOUNTER — Telehealth: Payer: Self-pay | Admitting: Internal Medicine

## 2019-05-25 NOTE — Telephone Encounter (Signed)
Good morning, patient called to let you know that she thinks she's having a flare up with her upper GI

## 2019-05-25 NOTE — Telephone Encounter (Signed)
Patient with indigestion, gas, bloating.  She has tried OTC products with little relief.  She will come in on Wed and see Amy Esterwood PA at 3:00

## 2019-05-27 ENCOUNTER — Ambulatory Visit (INDEPENDENT_AMBULATORY_CARE_PROVIDER_SITE_OTHER): Payer: BC Managed Care – PPO | Admitting: Physician Assistant

## 2019-05-27 ENCOUNTER — Encounter: Payer: Self-pay | Admitting: Physician Assistant

## 2019-05-27 DIAGNOSIS — K2100 Gastro-esophageal reflux disease with esophagitis, without bleeding: Secondary | ICD-10-CM | POA: Diagnosis not present

## 2019-05-27 DIAGNOSIS — R142 Eructation: Secondary | ICD-10-CM

## 2019-05-27 MED ORDER — PANTOPRAZOLE SODIUM 40 MG PO TBEC: DELAYED_RELEASE_TABLET | ORAL | 1 refills | Status: DC

## 2019-05-27 NOTE — Progress Notes (Signed)
Subjective:    Patient ID: Vicki [name], female    DOB: 1961/02/10, 58 y.o.   MRN: [id number]  HPI This service was provided via telemedicine.  Telephone call. The patient was located at home. The provider was located in provider's GI office. The patient did consent to this telephone visit and is aware of possible charges with her insurance for this visit. To persons participating in this telemedicine service were the patient and I. Time spent on call;12 min.  Vicki [name] is a 58 year old female, known to Dr. Hilarie Fredrickson with history of celiac disease, IBS, EPI, and hypothyroidism.  She was last seen in the office on March 24, 2019 and was stable at that time.  She has had good response to Zen Pep, 40,000 units taking 1-2 daily with meals and 1 with snacks.  She uses Lomotil as needed. Last colonoscopy August 2017 - with exception of 1 small hyperplastic polyp. Patient called in earlier this week with exacerbation of GI symptoms primarily with very frequent deep belches, burping, gas, all of which has occurred since she fell on May 09, 2019 and sustained a fracture of the right fibula.  She says she is usually extremely active and has been much more sedentary since her fall.  She says she is also understandably been stressed.  She has tried over-the-counter Pepcid, Tums, Gas-X etc. but continues to feel miserable due to ongoing epigastric and subxiphoid discomfort.  She says she has been waking up with a bad taste in her mouth and a little bit nauseated in the mornings.  Bowel movements have been normal for her, no melena or hematochezia.  No fever or chills.  No vomiting.  She says she has been eating very light because she feels gassy and bloated.  She has been taking some NSAIDs but had been doing so fairly chronically and is not taking anymore over the past few weeks since the fracture. She has been very strict with gluten-free diet and continues using Zenpep regularly.  Review of Systems  Pertinent positive and negative review of systems were noted in the above HPI section.  All other review of systems was otherwise negative.  Outpatient Encounter Medications as of 05/27/2019  Medication Sig  . albuterol (PROVENTIL HFA;VENTOLIN HFA) 108 (90 Base) MCG/ACT inhaler INHALE 2 PUFFS INTO THE LUNGS EVERY 4 (FOUR) HOURS AS NEEDED FOR WHEEZING OR SHORTNESS OF BREATH.  . baclofen (LIORESAL) 10 MG tablet Take 10 mg by mouth every 8 (eight) hours as needed (for migraines).  . diphenoxylate-atropine (LOMOTIL) 2.5-0.025 MG tablet Take 1 tablet by mouth 4 (four) times daily as needed for diarrhea or loose stools.  Marland Kitchen estradiol (ESTRACE) 0.1 MG/GM vaginal cream Place vaginally.  . mometasone (NASONEX) 50 MCG/ACT nasal spray USE 2 SPRAYS NASALLY EVERY DAY  . Multiple Vitamins-Minerals (WOMENS MULTIVITAMIN PLUS PO) Take by mouth daily.  . Pancrelipase, Lip-Prot-Amyl, (ZENPEP) 40000-126000 units CPEP Take 2 capsules by mouth 3 (three) times daily with meals. And 1 capsule with snacks (total up to 8 capsules daily)  . pantoprazole (PROTONIX) 40 MG tablet Take 40 mg twice a day for 1 week. Then reduce to 1 tablet 40 mg daily before breakfast.  . PARoxetine (PAXIL) 10 MG tablet TAKE 1 TABLET BY MOUTH EVERY MORNING  . pseudoephedrine (SUDAFED) 30 MG tablet Take by mouth.  . SUMAtriptan (IMITREX) 100 MG tablet TAKE 1 TABLET BY MOUTH EVERY 2 HOURS AS NEEDED FOR MIGRAINE  . valACYclovir (VALTREX) 1000 MG tablet Take two at onset of  cold sore and repeat 2 in 12 hours.  . Vitamin D, Ergocalciferol, (DRISDOL) 1.25 MG (50000 UT) CAPS capsule TAKE 1 CAPSULE (50,000 UNITS TOTAL) BY MOUTH EVERY 7 (SEVEN) DAYS.   Facility-Administered Encounter Medications as of 05/27/2019  Medication  . 0.9 %  sodium chloride infusion   Allergies  Allergen Reactions  . Lamisil [Terbinafine] Hives  . Ceclor [Cefaclor]     hives  . Cephalosporins   . Latex   . Zoster Vaccine Live Rash   Patient Active Problem List    Diagnosis Date Noted  . Chronic migraine without aura without status migrainosus, not intractable 02/19/2019  . AC joint pain 02/17/2019  . Frozen shoulder 10/14/2018  . Irritable bowel disease 01/14/2018  . Gastroesophageal reflux disease without esophagitis 11/09/2015  . Belching 11/09/2015  . Loose stools 11/09/2015  . History of migraine headaches 01/09/2013  . Asthma, mild intermittent 01/31/2012  . GERD (gastroesophageal reflux disease) 01/31/2012  . Celiac disease 01/31/2012  . Hypothyroid 01/31/2012  . Panic disorder 01/31/2012   Social History   Socioeconomic History  . Marital status: Married    Spouse name: Carlis Abbott  . Number of children: 4  . Years of education: Not on file  . Highest education level: Not on file  Occupational History  . Occupation: retired Therapist, sports  Tobacco Use  . Smoking status: Never Smoker  . Smokeless tobacco: Never Used  Substance and Sexual Activity  . Alcohol use: No  . Drug use: No  . Sexual activity: Not on file  Other Topics Concern  . Not on file  Social History Narrative  . Not on file   Social Determinants of Health   Financial Resource Strain:   . Difficulty of Paying Living Expenses: Not on file  Food Insecurity:   . Worried About Charity fundraiser in the Last Year: Not on file  . Ran Out of Food in the Last Year: Not on file  Transportation Needs:   . Lack of Transportation (Medical): Not on file  . Lack of Transportation (Non-Medical): Not on file  Physical Activity:   . Days of Exercise per Week: Not on file  . Minutes of Exercise per Session: Not on file  Stress:   . Feeling of Stress : Not on file  Social Connections:   . Frequency of Communication with Friends and Family: Not on file  . Frequency of Social Gatherings with Friends and Family: Not on file  . Attends Religious Services: Not on file  . Active Member of Clubs or Organizations: Not on file  . Attends Archivist Meetings: Not on file  . Marital  Status: Not on file  Intimate Partner Violence:   . Fear of Current or Ex-Partner: Not on file  . Emotionally Abused: Not on file  . Physically Abused: Not on file  . Sexually Abused: Not on file    Ms. Bickley's family history includes Alcohol abuse in her father and mother; Arthritis in her maternal grandmother; Asthma in her paternal aunt; Cancer in her mother; Cancer (age of onset: 9) in her sister; Colon cancer in her paternal aunt and paternal grandfather; Irritable bowel syndrome in her sister; Lupus in her sister; Stroke in her father.      Objective:    There were no vitals filed for this visit.  Physical Exam no exam today this was a telephone visit       Assessment & Plan:   #61 58 year old female with history of celiac  disease, IBS, and exocrine pancreatic insufficiency with 2-1/2-week history of persistent upper GI symptoms with belching burping gas indigestion and subxiphoid discomfort. I suspect she has had exacerbation of GERD, possible gastritis and/or esophagitis. She says she did have GERD symptoms when she was initially diagnosed with celiac disease, however has maintained a strict gluten-free diet  Plan; continue strict gluten-free precautions. Avoid NSAIDs CBC with differential, c-Met, H. pylori antibody. Start Protonix 40 mg p.o. AC breakfast and AC dinner x1 week then decrease to 40 mg p.o. every morning. Will reassess in about a week, and if no improvement in symptoms determine if need for imaging and/or EGD.   Amy S Esterwood PA-C 05/27/2019   Cc: Eulas Post, MD

## 2019-05-28 ENCOUNTER — Other Ambulatory Visit (INDEPENDENT_AMBULATORY_CARE_PROVIDER_SITE_OTHER): Payer: BC Managed Care – PPO

## 2019-05-28 DIAGNOSIS — K2100 Gastro-esophageal reflux disease with esophagitis, without bleeding: Secondary | ICD-10-CM | POA: Diagnosis not present

## 2019-05-28 DIAGNOSIS — R142 Eructation: Secondary | ICD-10-CM | POA: Diagnosis not present

## 2019-05-28 LAB — COMPREHENSIVE METABOLIC PANEL
ALT: 12 U/L (ref 0–35)
AST: 20 U/L (ref 0–37)
Albumin: 4.2 g/dL (ref 3.5–5.2)
Alkaline Phosphatase: 43 U/L (ref 39–117)
BUN: 12 mg/dL (ref 6–23)
CO2: 31 mEq/L (ref 19–32)
Calcium: 9.6 mg/dL (ref 8.4–10.5)
Chloride: 102 mEq/L (ref 96–112)
Creatinine, Ser: 0.81 mg/dL (ref 0.40–1.20)
GFR: 72.16 mL/min (ref >60.00)
Glucose, Bld: 93 mg/dL (ref 70–99)
Potassium: 4.2 mEq/L (ref 3.5–5.1)
Sodium: 140 mEq/L (ref 135–145)
Total Bilirubin: 0.5 mg/dL (ref 0.2–1.2)
Total Protein: 6.4 g/dL (ref 6.0–8.3)

## 2019-05-28 LAB — CBC WITH DIFFERENTIAL/PLATELET
Basophils Absolute: 0.1 10*3/uL (ref 0.0–0.1)
Basophils Relative: 2.6 % (ref 0.0–3.0)
Eosinophils Absolute: 0.2 10*3/uL (ref 0.0–0.7)
Eosinophils Relative: 6.4 % — ABNORMAL HIGH (ref 0.0–5.0)
HCT: 36.8 % (ref 36.0–46.0)
Hemoglobin: 12.2 g/dL (ref 12.0–15.0)
Lymphocytes Relative: 35.5 % (ref 12.0–46.0)
Lymphs Abs: 1.2 10*3/uL (ref 0.7–4.0)
MCHC: 33.2 g/dL (ref 30.0–36.0)
MCV: 94 fl (ref 78.0–100.0)
Monocytes Absolute: 0.3 10*3/uL (ref 0.1–1.0)
Monocytes Relative: 7.9 % (ref 3.0–12.0)
Neutro Abs: 1.6 10*3/uL (ref 1.4–7.7)
Neutrophils Relative %: 47.6 % (ref 43.0–77.0)
Platelets: 220 10*3/uL (ref 150.0–400.0)
RBC: 3.92 Mil/uL (ref 3.87–5.11)
RDW: 12.5 % (ref 11.5–15.5)
WBC: 3.4 10*3/uL — ABNORMAL LOW (ref 4.0–10.5)

## 2019-05-30 ENCOUNTER — Other Ambulatory Visit: Payer: Self-pay | Admitting: Family Medicine

## 2019-06-02 NOTE — Progress Notes (Signed)
Addendum: Reviewed and agree with assessment and management plan. ,  M, MD  

## 2019-06-03 ENCOUNTER — Other Ambulatory Visit (INDEPENDENT_AMBULATORY_CARE_PROVIDER_SITE_OTHER): Payer: BC Managed Care – PPO

## 2019-06-03 ENCOUNTER — Telehealth: Payer: Self-pay

## 2019-06-03 DIAGNOSIS — R142 Eructation: Secondary | ICD-10-CM

## 2019-06-03 DIAGNOSIS — K900 Celiac disease: Secondary | ICD-10-CM

## 2019-06-03 DIAGNOSIS — K219 Gastro-esophageal reflux disease without esophagitis: Secondary | ICD-10-CM

## 2019-06-03 DIAGNOSIS — R140 Abdominal distension (gaseous): Secondary | ICD-10-CM | POA: Diagnosis not present

## 2019-06-03 DIAGNOSIS — R195 Other fecal abnormalities: Secondary | ICD-10-CM

## 2019-06-03 DIAGNOSIS — R634 Abnormal weight loss: Secondary | ICD-10-CM

## 2019-06-03 LAB — H. PYLORI ANTIBODY, IGG: H Pylori IgG: NEGATIVE

## 2019-06-03 NOTE — Telephone Encounter (Signed)
Lab ordered on 05-27-2019 for a stool study H.pylori was not ordered correctly. She has started her PPI and the lab has requested to do a blood test instead. Verbal Ok from [employer]

## 2019-06-08 ENCOUNTER — Encounter: Payer: Self-pay | Admitting: Podiatry

## 2019-06-09 ENCOUNTER — Ambulatory Visit (INDEPENDENT_AMBULATORY_CARE_PROVIDER_SITE_OTHER): Payer: BC Managed Care – PPO | Admitting: Podiatry

## 2019-06-09 ENCOUNTER — Other Ambulatory Visit: Payer: Self-pay

## 2019-06-09 ENCOUNTER — Ambulatory Visit (INDEPENDENT_AMBULATORY_CARE_PROVIDER_SITE_OTHER): Payer: BC Managed Care – PPO

## 2019-06-09 ENCOUNTER — Encounter: Payer: Self-pay | Admitting: Podiatry

## 2019-06-09 DIAGNOSIS — S82831D Other fracture of upper and lower end of right fibula, subsequent encounter for closed fracture with routine healing: Secondary | ICD-10-CM

## 2019-06-10 NOTE — Progress Notes (Signed)
She presents today for follow-up of fracture right fibula states that it seems to be doing okay but I got a sore on top of the foot now from rubbing in the boot.  Objective: Vital signs are stable she is alert oriented x3 still has some mild edema to the inferior lateral malleolar area with tenderness on palpation of the malleolus.  Superficial skin abrasion to the dorsal aspect of the foot overlying subtle bone area.  Radiographs taken today still demonstrate distal fragment of the fibula nonhealed.  Pain level is decreasing with boot wear.  More than likely this does become a nonunion and fibrotic and the pain will resolve.  Plan: Continue to wear the boot for 1 more month.  Cover the skin lesion dorsally with a pad to help prevent the lesion from rubbing.

## 2019-06-19 ENCOUNTER — Telehealth: Payer: Self-pay | Admitting: *Deleted

## 2019-06-19 ENCOUNTER — Other Ambulatory Visit: Payer: Self-pay | Admitting: Physician Assistant

## 2019-06-19 NOTE — Telephone Encounter (Signed)
I spoke with pt and she states got out of the shower, she slipped on the wet floor and is in the boot at this time. Pt states it is a 3 out of 5 today, but yesterday was worse and has the same swelling as before, and she would like to know if she can come in next week for an x-ray to make sure she did not undo what she had done. I  Told pt to rest, and remain in the boot until seen in office.

## 2019-06-19 NOTE — Telephone Encounter (Signed)
Pt called states she has questions.

## 2019-06-23 ENCOUNTER — Encounter: Payer: Self-pay | Admitting: Podiatry

## 2019-06-23 ENCOUNTER — Ambulatory Visit (INDEPENDENT_AMBULATORY_CARE_PROVIDER_SITE_OTHER): Payer: BC Managed Care – PPO | Admitting: Podiatry

## 2019-06-23 ENCOUNTER — Ambulatory Visit (INDEPENDENT_AMBULATORY_CARE_PROVIDER_SITE_OTHER): Payer: BC Managed Care – PPO

## 2019-06-23 ENCOUNTER — Other Ambulatory Visit: Payer: Self-pay

## 2019-06-23 DIAGNOSIS — S82831D Other fracture of upper and lower end of right fibula, subsequent encounter for closed fracture with routine healing: Secondary | ICD-10-CM | POA: Diagnosis not present

## 2019-06-23 DIAGNOSIS — S9001XA Contusion of right ankle, initial encounter: Secondary | ICD-10-CM

## 2019-06-24 NOTE — Progress Notes (Signed)
She presents today concerned about her right ankle as she lost her balance and fell out of the shower January 22.  She does not think that she injured her foot any worse than she already had but she wants to have it checked anyway.  States that she has been wearing her boot regularly and her ankle was feeling much better.  States that it felt good without the boot before she fell and is now little more tender.  ROS: Denies fever chills nausea vomiting muscle aches pains calf pain back pain chest pain shortness of breath.  Denies any other injuries from the fall.  Objective: Vital signs are stable alert and oriented x3.  Pulses are palpable.  Right ankle does demonstrate a mild edema to the medial and lateral malleoli.  Otherwise no other edema.  Mild tenderness around the floor the sinus tarsi and the distalmost aspect of the fibula.  No ecchymosis good range of motion peroneal tendons are intact with full strength.  Radiographs taken today of the ankle do not demonstrate any significant acute injury.  Healing avulsion fracture distal fibula present.  Assessment: Well-healing fracture right fibula no other fractures identified.  Plan: Continue the use of the cam walker for another couple of weeks I would like to follow-up with her at that time.

## 2019-07-07 ENCOUNTER — Ambulatory Visit: Payer: BC Managed Care – PPO | Admitting: Podiatry

## 2019-07-14 ENCOUNTER — Ambulatory Visit (INDEPENDENT_AMBULATORY_CARE_PROVIDER_SITE_OTHER): Payer: BC Managed Care – PPO | Admitting: Podiatry

## 2019-07-14 ENCOUNTER — Ambulatory Visit (INDEPENDENT_AMBULATORY_CARE_PROVIDER_SITE_OTHER): Payer: BC Managed Care – PPO

## 2019-07-14 ENCOUNTER — Other Ambulatory Visit: Payer: Self-pay

## 2019-07-14 DIAGNOSIS — S82831D Other fracture of upper and lower end of right fibula, subsequent encounter for closed fracture with routine healing: Secondary | ICD-10-CM

## 2019-07-14 NOTE — Progress Notes (Signed)
She presents today for follow-up of her fracture to her fibula states that is about 60% improved states that it is healing very slowly.  Objective: Vital signs are stable she is alert and oriented x3.  She has some tenderness with mild edema to the lateral aspect but she has good plantar flexion and inversion and plantar flexion and abduction.  No signs of perineal injury.  Radiographs taken today demonstrate a healing fracture fragment still has a diastases that is present but hopefully will continue to progress.  Assessment:'s very slow healing distal fibular fracture.  Plan: I am going to place her in a Tri-Lock brace and she will continue use the cam walker if necessary.  Otherwise I will follow up with her in about 1 month at which time we may need to request a bone stimulator.

## 2019-07-21 ENCOUNTER — Encounter: Payer: Self-pay | Admitting: Family Medicine

## 2019-08-13 ENCOUNTER — Ambulatory Visit (INDEPENDENT_AMBULATORY_CARE_PROVIDER_SITE_OTHER): Payer: BC Managed Care – PPO

## 2019-08-13 ENCOUNTER — Ambulatory Visit (INDEPENDENT_AMBULATORY_CARE_PROVIDER_SITE_OTHER): Payer: BC Managed Care – PPO | Admitting: Podiatry

## 2019-08-13 ENCOUNTER — Encounter: Payer: Self-pay | Admitting: Podiatry

## 2019-08-13 ENCOUNTER — Other Ambulatory Visit: Payer: Self-pay

## 2019-08-13 DIAGNOSIS — S82831D Other fracture of upper and lower end of right fibula, subsequent encounter for closed fracture with routine healing: Secondary | ICD-10-CM | POA: Diagnosis not present

## 2019-08-13 NOTE — Progress Notes (Signed)
She presents today for follow-up of a fracture to her fibula of her right foot.  She states that it still feels bad she has noticed that over the past week she is able to get around a whole lot easier and do a whole lot more.  She really seems to like the Tri-Lock brace she says.  Objective: Vital signs are stable alert and oriented x3.  There is no erythema edema cellulitis drainage or odor she has no pain on palpation of the previous fracture site though she does have some tenderness on palpation of the peroneus brevis tendon.  Radiographs taken today do not demonstrate any type of significant fracture and it appears that the distal aspect of the fibula with a small avulsed tip appears to be healing in.  Assessment: Slowly resolving fracture fibula right cannot rule out a possible tear of the peroneus brevis.  Plan: I am going to allow her to get back to her regular activities I will follow-up with her on an as-needed basis.

## 2019-08-16 ENCOUNTER — Other Ambulatory Visit: Payer: Self-pay | Admitting: Family Medicine

## 2019-08-16 ENCOUNTER — Other Ambulatory Visit: Payer: Self-pay | Admitting: Internal Medicine

## 2019-08-25 ENCOUNTER — Other Ambulatory Visit: Payer: Self-pay | Admitting: Family Medicine

## 2019-10-02 DIAGNOSIS — Z1231 Encounter for screening mammogram for malignant neoplasm of breast: Secondary | ICD-10-CM | POA: Diagnosis not present

## 2019-10-02 LAB — HM MAMMOGRAPHY

## 2019-10-07 ENCOUNTER — Encounter: Payer: Self-pay | Admitting: Family Medicine

## 2019-10-14 IMAGING — MR MRI OF THE RIGHT SHOULDER WITHOUT CONTRAST
5 of 6 series · 33 of 40 positions shown · non-contrast
Comparison: Radiographs dated 10/14/2018

CLINICAL DATA: Chronic right shoulder pain.

EXAM:
MRI OF THE RIGHT SHOULDER WITHOUT CONTRAST
TECHNIQUE: Multiplanar, multisequence MR imaging of the shoulder was performed.
No intravenous contrast was administered.

[Series 4: PD fat-sat · axial · 4.0mm · 0.27mm/px · z∈[+3,+76]mm · 7 of 17 slices shown (1 of 2)]
[im 1/17]
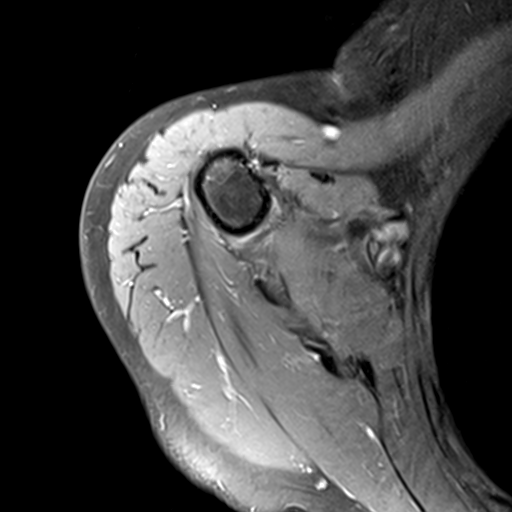
[im 3/17]
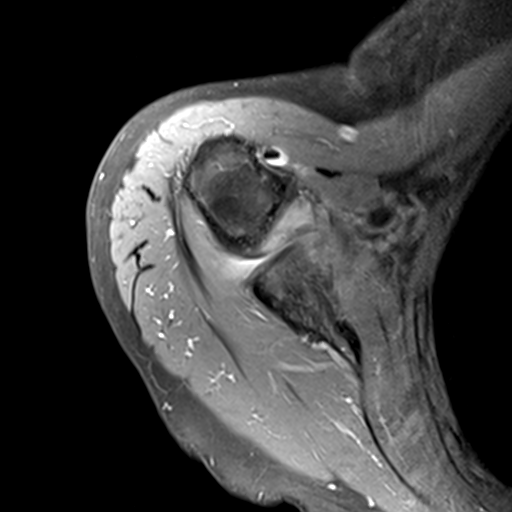
[im 6/17]
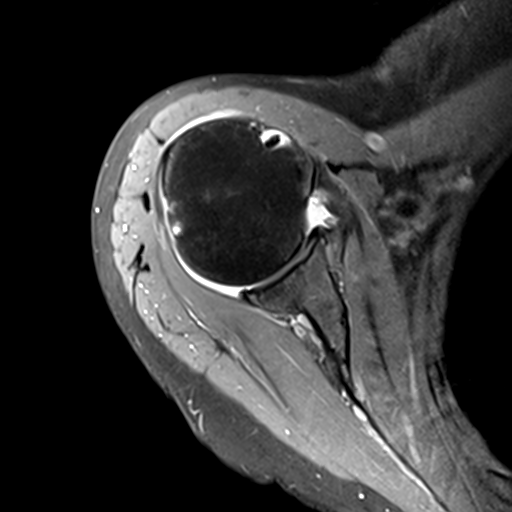
[im 9/17]
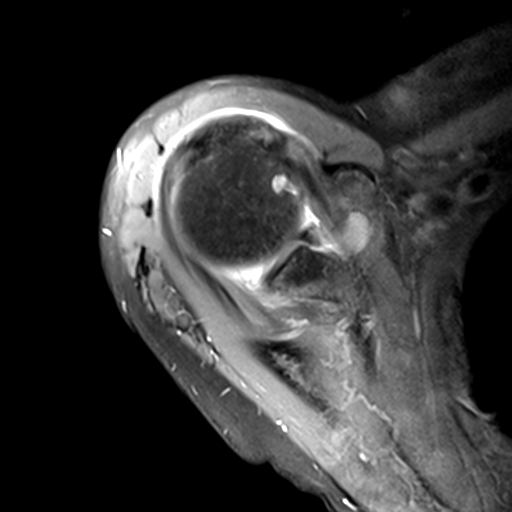
[im 11/17]
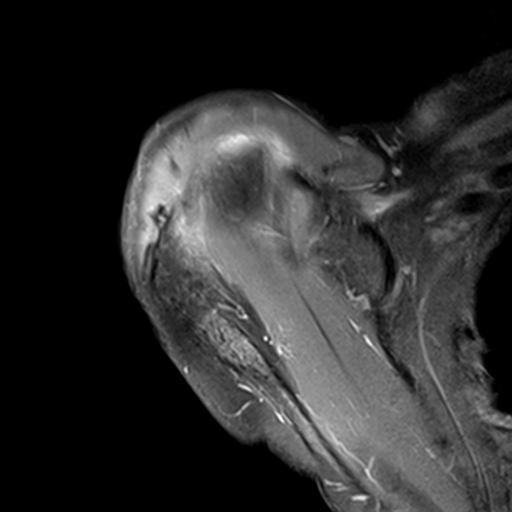
[im 14/17]
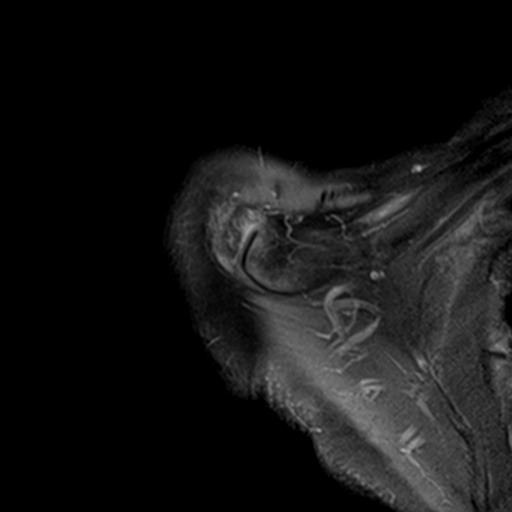
[im 17/17]
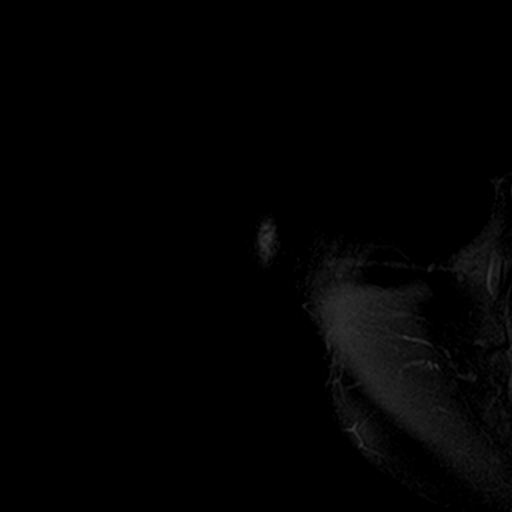

[Series 5: T2 fat-sat · sagittal · 4.0mm · 0.55mm/px · 6 of 15 slices shown (1 of 3)]
[im 1/15]
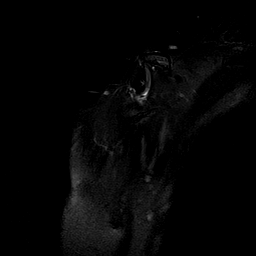
[im 3/15]
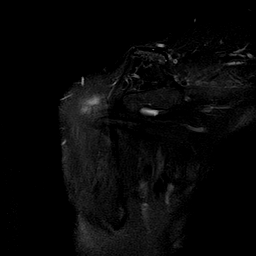
[im 6/15]
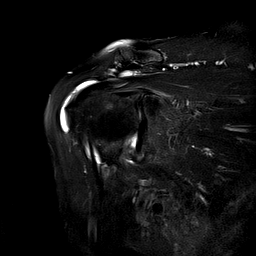
[im 9/15]
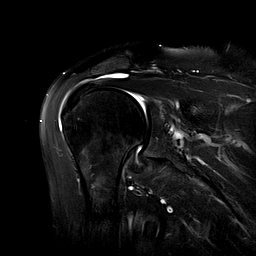
[im 12/15]
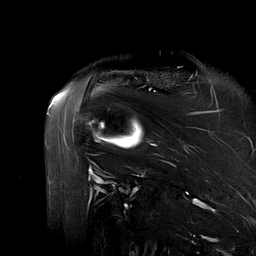
[im 15/15]
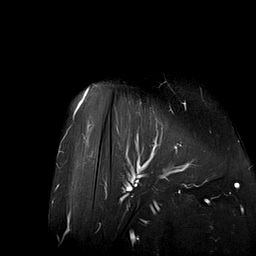

[Series 6: PD fat-sat · sagittal · 4.0mm · 0.27mm/px · 6 of 15 slices shown (2 of 2)]
[im 1/15]
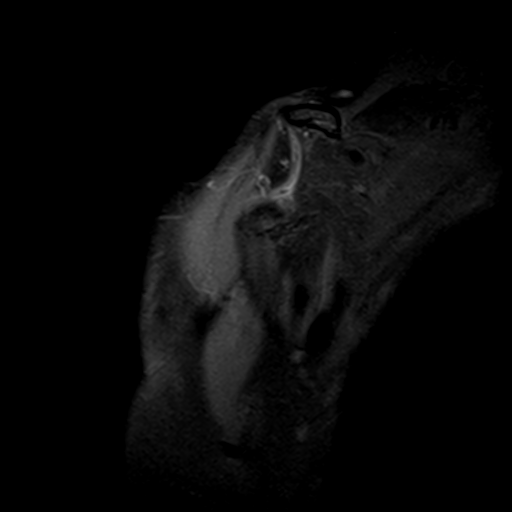
[im 3/15]
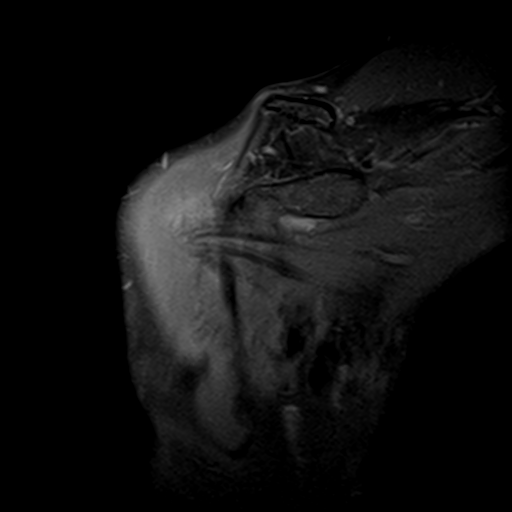
[im 6/15]
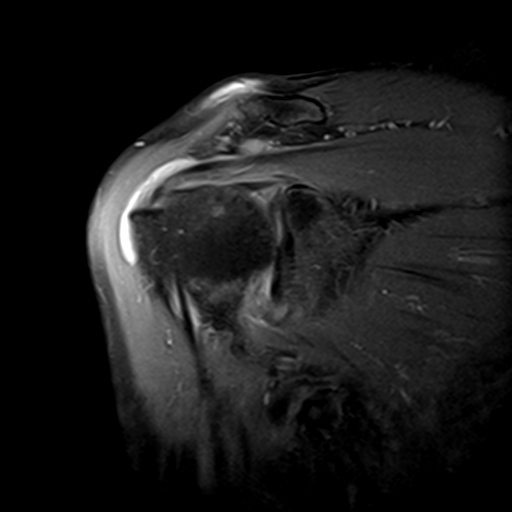
[im 9/15]
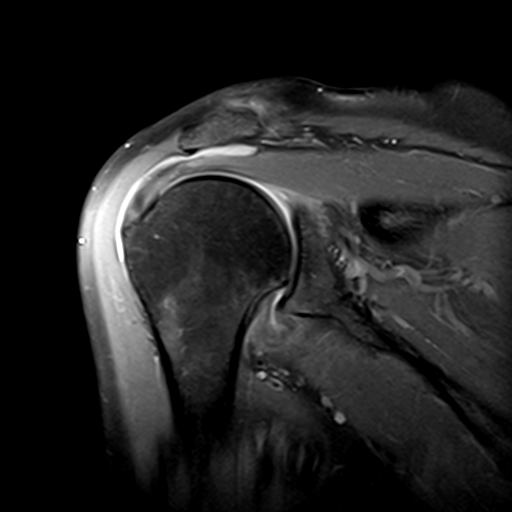
[im 12/15]
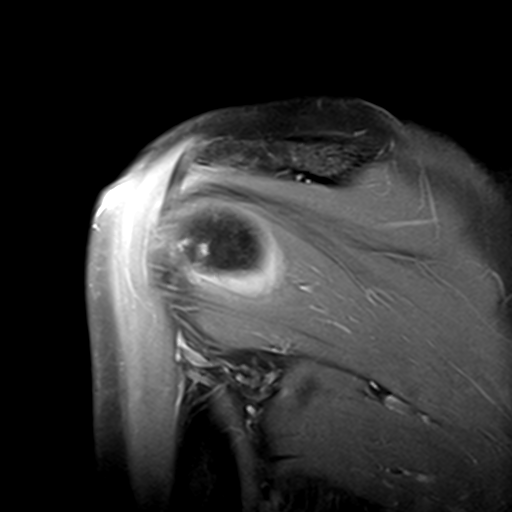
[im 15/15]
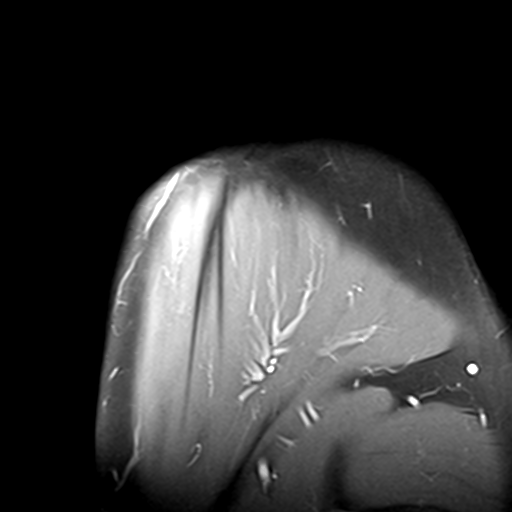

[Series 7: T2 fat-sat · coronal · 4.0mm · 0.55mm/px · 7 of 17 slices shown (2 of 3)]
[im 1/17]
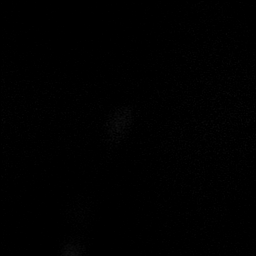
[im 3/17]
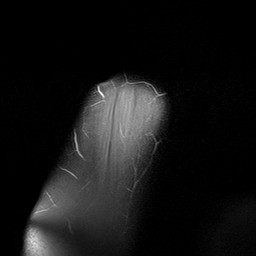
[im 6/17]
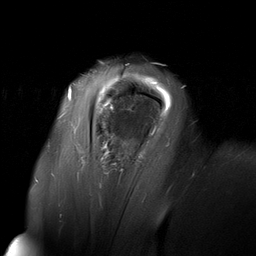
[im 9/17]
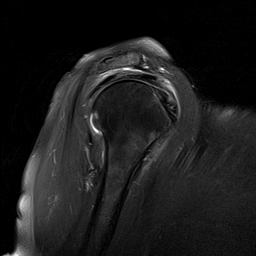
[im 11/17]
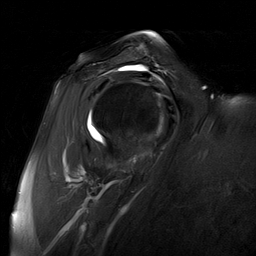
[im 14/17]
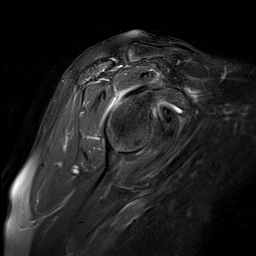
[im 17/17]
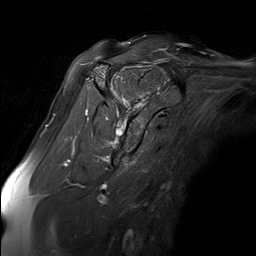

[Series 9: T2 fat-sat · coronal · 4.0mm · 0.55mm/px · 7 of 17 slices shown (3 of 3)]
[im 1/17]
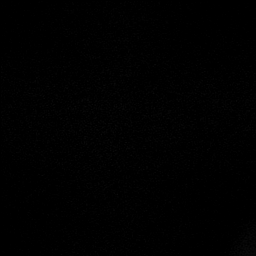
[im 3/17]
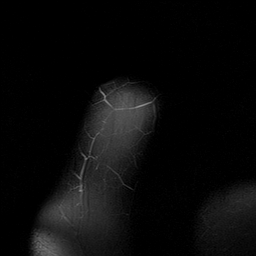
[im 6/17]
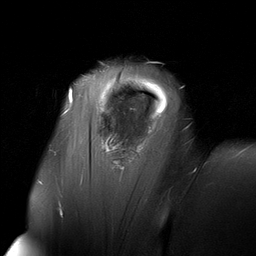
[im 9/17]
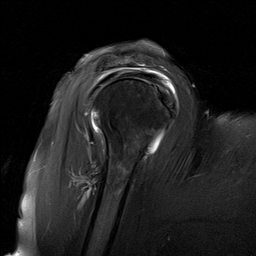
[im 11/17]
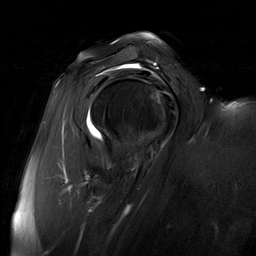
[im 14/17]
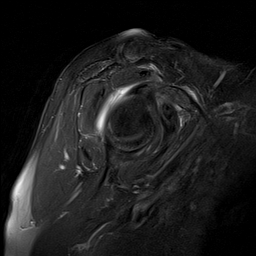
[im 17/17]
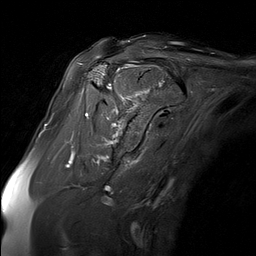

[33 of 40 positions shown; findings below may reference images not displayed]

FINDINGS: Rotator cuff: Intact supraspinatus, infraspinatus, teres minor and
subscapularis tendons.

Muscles: No atrophy or abnormal signal of the muscles of the rotator
cuff.

Biceps long head:  Properly located and intact.

Acromioclavicular Joint: Minimal degenerative changes. Type 1
acromion. There is fluid in the subacromial/subdeltoid bursae.

Glenohumeral Joint: Small glenohumeral joint effusion. Slight
increased soft tissue density in the rotator cuff interval. Slight
thickening and edema around the inferior glenohumeral ligament. No
chondral defect.

Labrum:  Intact.

Bones: Small degenerative cyst in the superior aspect of the lesser
tuberosity.

Other: None
IMPRESSION: 1. Subacromial/subdeltoid bursitis.
2. Intact rotator cuff.
3. Small glenohumeral joint effusion.
4. Abnormal soft tissue in the rotator cuff interval and at the
inferior glenohumeral ligament which is nonspecific but can be seen
with adhesive capsulitis.

## 2019-11-05 DIAGNOSIS — H43813 Vitreous degeneration, bilateral: Secondary | ICD-10-CM | POA: Diagnosis not present

## 2019-11-19 DIAGNOSIS — H43813 Vitreous degeneration, bilateral: Secondary | ICD-10-CM | POA: Diagnosis not present

## 2019-11-24 DIAGNOSIS — Z01419 Encounter for gynecological examination (general) (routine) without abnormal findings: Secondary | ICD-10-CM | POA: Diagnosis not present

## 2019-11-24 DIAGNOSIS — Z1212 Encounter for screening for malignant neoplasm of rectum: Secondary | ICD-10-CM | POA: Diagnosis not present

## 2019-11-26 ENCOUNTER — Encounter: Payer: Self-pay | Admitting: Podiatry

## 2019-11-26 ENCOUNTER — Ambulatory Visit (INDEPENDENT_AMBULATORY_CARE_PROVIDER_SITE_OTHER): Payer: BC Managed Care – PPO

## 2019-11-26 ENCOUNTER — Ambulatory Visit (INDEPENDENT_AMBULATORY_CARE_PROVIDER_SITE_OTHER): Payer: BC Managed Care – PPO | Admitting: Podiatry

## 2019-11-26 ENCOUNTER — Other Ambulatory Visit: Payer: Self-pay

## 2019-11-26 DIAGNOSIS — S82831D Other fracture of upper and lower end of right fibula, subsequent encounter for closed fracture with routine healing: Secondary | ICD-10-CM | POA: Diagnosis not present

## 2019-11-26 NOTE — Progress Notes (Signed)
She presents today for follow-up of a fracture of distal fibula.  She states that it seems to be doing better but I still have a lot of restriction in range of motion and some pain when I try to get that through that range of motion.  Objective: Vital signs are stable she is alert oriented x3.  Pulses are palpable.  Much decrease in edema and pain on palpation of the fibula from previous evaluation.  Pulses remain palpable she has limited active plantar flexion and dorsiflexion however with assisted plantarflexion and dorsiflexion she is able to get to normal range of motion that matches her contralateral foot.  She also has some tenderness in the medial longitudinal arch.  Assessment: Stiffness post cast post CAM Walker due to fracture of the fibula right.  Plan: We will send her to physical therapy for better range of motion I will follow-up with her if her pain is not reduced.  May need to consider MRI.

## 2019-11-27 ENCOUNTER — Telehealth: Payer: Self-pay | Admitting: *Deleted

## 2019-11-27 ENCOUNTER — Encounter: Payer: Self-pay | Admitting: Podiatry

## 2019-11-27 DIAGNOSIS — S82831D Other fracture of upper and lower end of right fibula, subsequent encounter for closed fracture with routine healing: Secondary | ICD-10-CM

## 2019-11-27 DIAGNOSIS — S9001XA Contusion of right ankle, initial encounter: Secondary | ICD-10-CM

## 2019-11-27 DIAGNOSIS — S82831A Other fracture of upper and lower end of right fibula, initial encounter for closed fracture: Secondary | ICD-10-CM

## 2019-11-27 NOTE — Telephone Encounter (Signed)
Faxed required from, [employer] with clinical and insurance to [employer].

## 2019-11-27 NOTE — Telephone Encounter (Signed)
-----   Message from R[place], [facility] sent at 11/26/2019  4:43 PM EDT ----- Regarding: PT PT - patient request [place] area  Evaluate and treat - ankle joint manipulation/strengthening  Duration: 3 x week x 4 weeks

## 2019-12-08 DIAGNOSIS — M62561 Muscle wasting and atrophy, not elsewhere classified, right lower leg: Secondary | ICD-10-CM | POA: Diagnosis not present

## 2019-12-08 DIAGNOSIS — M25671 Stiffness of right ankle, not elsewhere classified: Secondary | ICD-10-CM | POA: Diagnosis not present

## 2019-12-08 DIAGNOSIS — R269 Unspecified abnormalities of gait and mobility: Secondary | ICD-10-CM | POA: Diagnosis not present

## 2019-12-08 DIAGNOSIS — M25571 Pain in right ankle and joints of right foot: Secondary | ICD-10-CM | POA: Diagnosis not present

## 2019-12-10 DIAGNOSIS — R269 Unspecified abnormalities of gait and mobility: Secondary | ICD-10-CM | POA: Diagnosis not present

## 2019-12-10 DIAGNOSIS — M62561 Muscle wasting and atrophy, not elsewhere classified, right lower leg: Secondary | ICD-10-CM | POA: Diagnosis not present

## 2019-12-10 DIAGNOSIS — M25571 Pain in right ankle and joints of right foot: Secondary | ICD-10-CM | POA: Diagnosis not present

## 2019-12-10 DIAGNOSIS — M25671 Stiffness of right ankle, not elsewhere classified: Secondary | ICD-10-CM | POA: Diagnosis not present

## 2019-12-14 DIAGNOSIS — R269 Unspecified abnormalities of gait and mobility: Secondary | ICD-10-CM | POA: Diagnosis not present

## 2019-12-14 DIAGNOSIS — M62561 Muscle wasting and atrophy, not elsewhere classified, right lower leg: Secondary | ICD-10-CM | POA: Diagnosis not present

## 2019-12-14 DIAGNOSIS — M25571 Pain in right ankle and joints of right foot: Secondary | ICD-10-CM | POA: Diagnosis not present

## 2019-12-14 DIAGNOSIS — M25671 Stiffness of right ankle, not elsewhere classified: Secondary | ICD-10-CM | POA: Diagnosis not present

## 2019-12-16 DIAGNOSIS — M25671 Stiffness of right ankle, not elsewhere classified: Secondary | ICD-10-CM | POA: Diagnosis not present

## 2019-12-16 DIAGNOSIS — M25571 Pain in right ankle and joints of right foot: Secondary | ICD-10-CM | POA: Diagnosis not present

## 2019-12-16 DIAGNOSIS — R269 Unspecified abnormalities of gait and mobility: Secondary | ICD-10-CM | POA: Diagnosis not present

## 2019-12-16 DIAGNOSIS — M62561 Muscle wasting and atrophy, not elsewhere classified, right lower leg: Secondary | ICD-10-CM | POA: Diagnosis not present

## 2019-12-18 DIAGNOSIS — M62561 Muscle wasting and atrophy, not elsewhere classified, right lower leg: Secondary | ICD-10-CM | POA: Diagnosis not present

## 2019-12-18 DIAGNOSIS — M25571 Pain in right ankle and joints of right foot: Secondary | ICD-10-CM | POA: Diagnosis not present

## 2019-12-18 DIAGNOSIS — M25671 Stiffness of right ankle, not elsewhere classified: Secondary | ICD-10-CM | POA: Diagnosis not present

## 2019-12-18 DIAGNOSIS — R269 Unspecified abnormalities of gait and mobility: Secondary | ICD-10-CM | POA: Diagnosis not present

## 2019-12-21 DIAGNOSIS — M25571 Pain in right ankle and joints of right foot: Secondary | ICD-10-CM | POA: Diagnosis not present

## 2019-12-21 DIAGNOSIS — M25671 Stiffness of right ankle, not elsewhere classified: Secondary | ICD-10-CM | POA: Diagnosis not present

## 2019-12-21 DIAGNOSIS — M62561 Muscle wasting and atrophy, not elsewhere classified, right lower leg: Secondary | ICD-10-CM | POA: Diagnosis not present

## 2019-12-21 DIAGNOSIS — R269 Unspecified abnormalities of gait and mobility: Secondary | ICD-10-CM | POA: Diagnosis not present

## 2019-12-23 DIAGNOSIS — M62561 Muscle wasting and atrophy, not elsewhere classified, right lower leg: Secondary | ICD-10-CM | POA: Diagnosis not present

## 2019-12-23 DIAGNOSIS — M25671 Stiffness of right ankle, not elsewhere classified: Secondary | ICD-10-CM | POA: Diagnosis not present

## 2019-12-23 DIAGNOSIS — M25571 Pain in right ankle and joints of right foot: Secondary | ICD-10-CM | POA: Diagnosis not present

## 2019-12-23 DIAGNOSIS — R269 Unspecified abnormalities of gait and mobility: Secondary | ICD-10-CM | POA: Diagnosis not present

## 2019-12-28 DIAGNOSIS — M25671 Stiffness of right ankle, not elsewhere classified: Secondary | ICD-10-CM | POA: Diagnosis not present

## 2019-12-28 DIAGNOSIS — R269 Unspecified abnormalities of gait and mobility: Secondary | ICD-10-CM | POA: Diagnosis not present

## 2019-12-28 DIAGNOSIS — M25571 Pain in right ankle and joints of right foot: Secondary | ICD-10-CM | POA: Diagnosis not present

## 2019-12-28 DIAGNOSIS — M62561 Muscle wasting and atrophy, not elsewhere classified, right lower leg: Secondary | ICD-10-CM | POA: Diagnosis not present

## 2019-12-30 DIAGNOSIS — M25671 Stiffness of right ankle, not elsewhere classified: Secondary | ICD-10-CM | POA: Diagnosis not present

## 2019-12-30 DIAGNOSIS — M62561 Muscle wasting and atrophy, not elsewhere classified, right lower leg: Secondary | ICD-10-CM | POA: Diagnosis not present

## 2019-12-30 DIAGNOSIS — M25571 Pain in right ankle and joints of right foot: Secondary | ICD-10-CM | POA: Diagnosis not present

## 2019-12-30 DIAGNOSIS — R269 Unspecified abnormalities of gait and mobility: Secondary | ICD-10-CM | POA: Diagnosis not present

## 2020-01-01 DIAGNOSIS — M25671 Stiffness of right ankle, not elsewhere classified: Secondary | ICD-10-CM | POA: Diagnosis not present

## 2020-01-01 DIAGNOSIS — R269 Unspecified abnormalities of gait and mobility: Secondary | ICD-10-CM | POA: Diagnosis not present

## 2020-01-01 DIAGNOSIS — M62561 Muscle wasting and atrophy, not elsewhere classified, right lower leg: Secondary | ICD-10-CM | POA: Diagnosis not present

## 2020-01-01 DIAGNOSIS — M25571 Pain in right ankle and joints of right foot: Secondary | ICD-10-CM | POA: Diagnosis not present

## 2020-01-27 ENCOUNTER — Ambulatory Visit (INDEPENDENT_AMBULATORY_CARE_PROVIDER_SITE_OTHER): Payer: BC Managed Care – PPO | Admitting: Internal Medicine

## 2020-01-27 ENCOUNTER — Encounter: Payer: Self-pay | Admitting: Internal Medicine

## 2020-01-27 VITALS — BP 110/64 | HR 84 | Ht 65.0 in | Wt 116.0 lb

## 2020-01-27 DIAGNOSIS — K8681 Exocrine pancreatic insufficiency: Secondary | ICD-10-CM

## 2020-01-27 DIAGNOSIS — K900 Celiac disease: Secondary | ICD-10-CM

## 2020-01-27 DIAGNOSIS — K589 Irritable bowel syndrome without diarrhea: Secondary | ICD-10-CM | POA: Diagnosis not present

## 2020-01-27 DIAGNOSIS — K219 Gastro-esophageal reflux disease without esophagitis: Secondary | ICD-10-CM | POA: Diagnosis not present

## 2020-01-27 MED ORDER — ZENPEP 40000-126000 UNITS PO CPEP: 2.0000 | ORAL_CAPSULE | Freq: Three times a day (TID) | ORAL | 3 refills | Status: DC

## 2020-01-27 NOTE — Progress Notes (Signed)
Subjective:    Patient ID: Vicki [name], female    DOB: Jan 13, 1961, 59 y.o.   MRN: [id number]  HPI Vicki [name] is a 59 year old with a past medical history of celiac disease, exocrine pancreatic insufficiency, IBS, intermittent GERD/dyspepsia, hypothyroidism and migraines who is here for follow-up.  She was last seen by Nicoletta Ba in December 2020 and by me in the office in October 2020.  She is here alone today.  She reports that she has been doing fairly well.  She did have overall got inflammation around the time that she broke her distal right fibula in December 2020.  She has just finished physical therapy for this fracture.  She saw Amy in December and was given pantoprazole which she took daily for some time which improved reflux, belching and overall GI upset.  H. pylori serology was negative at that time.  She is now using this only as needed and has not needed it recently.  When she does need it it is for reflux and belching and GI upset symptoms.  When she uses it she will use it for approximately a week and then stop.  She continues her gluten free and dairy free diet.  Lactaid has not helped much.  She is also found that tomatoes seem to bother her.  Her diet feels somewhat restricted.  She continues Zenpep which is definitively been helpful and she is using 1 in the morning, 2 at lunch and 2 at dinner and 1 with snack.  Bowel movements are mostly formed and without steatorrhea, blood or mucus.  She can have some days where stools are hard "pebbles" but usually they are more soft.  Energy levels fluctuate.  She has taking a daily women's multivitamin.  She is not needing Lomotil often maybe 1-2 times per month.  Blood counts, liver enzymes, renal function and iron studies have been checked within the last year and normal. Review of Systems As per HPI, otherwise negative  Current Medications, Allergies, Past Medical History, Past Surgical History, Family History and Social History  were reviewed in [employer] record.     Objective:   Physical Exam BP 110/64   Pulse 84   Ht 5' 5"  (1.651 m)   Wt 116 lb (52.6 kg)   SpO2 99%   BMI 19.30 kg/m  Gen: awake, alert, NAD HEENT: anicteric CV: RRR, no mrg Pulm: CTA b/l Abd: soft, NT/ND, +BS throughout Ext: no c/c/e Neuro: nonfocal     Assessment & Plan:  59 year old with a past medical history of celiac disease, exocrine pancreatic insufficiency, IBS, intermittent GERD/dyspepsia, hypothyroidism and migraines who is here for follow-up.   1.  Celiac disease --well-controlled on strict gluten-free diet  2.  Exocrine pancreatic insufficiency --she has responded very well to Zenpep 40,000 units 1 to 2 capsules with meals and 1 with snacks.  Zenpep was considerably more effective than Creon and with Creon she actually had abdominal discomfort and upset.  We need to continue Zenpep as above.  Vitamin D was normal when checked last year  3.  GERD/dyspepsia --intermittent symptoms, H. pylori negative.  Responds well to pantoprazole.  She has not needed this recently but okay to use pantoprazole 40 mg daily for 1 to 2 weeks on an as-needed basis when the symptoms are bothersome  4.  IBS --overlapping on top of celiac disease and exocrine pancreatic sufficiency.  She is managing this well.  Diet is somewhat restricted and  I recommended that she consider calorie count to ensure she is getting at least 1500 cal/day.  Overall her weight is stable compared to last year.  Okay for Lomotil 1 tablet 3 times daily as needed, which fortunately is quite rare  5.  CRC screening --she is up-to-date with colonoscopy having had a colonoscopy in August 2017 with removal of a distal hyperplastic polyp only  We can follow-up in about a year unless she needs to be seen sooner

## 2020-01-27 NOTE — Patient Instructions (Signed)
We have sent the following medications to your pharmacy for you to pick up at your convenience: Zenpep  Continue pantoprazole as needed.  If you are age 60 or older, your body mass index should be between 23-30. Your Body mass index is 19.3 kg/m. If this is out of the aforementioned range listed, please consider follow up with your Primary Care Provider.  If you are age 67 or younger, your body mass index should be between 19-25. Your Body mass index is 19.3 kg/m. If this is out of the aformentioned range listed, please consider follow up with your Primary Care Provider.    Due to recent changes in healthcare laws, you may see the results of your imaging and laboratory studies on MyChart before your provider has had a chance to review them.  We understand that in some cases there may be results that are confusing or concerning to you. Not all laboratory results come back in the same time frame and the provider may be waiting for multiple results in order to interpret others.  Please give [place] 48 hours in order for your provider to thoroughly review all the results before contacting the office for clarification of your results.

## 2020-02-12 DIAGNOSIS — G43009 Migraine without aura, not intractable, without status migrainosus: Secondary | ICD-10-CM | POA: Diagnosis not present

## 2020-02-12 DIAGNOSIS — G43109 Migraine with aura, not intractable, without status migrainosus: Secondary | ICD-10-CM | POA: Diagnosis not present

## 2020-02-12 DIAGNOSIS — G43709 Chronic migraine without aura, not intractable, without status migrainosus: Secondary | ICD-10-CM | POA: Diagnosis not present

## 2020-02-16 DIAGNOSIS — H2513 Age-related nuclear cataract, bilateral: Secondary | ICD-10-CM | POA: Diagnosis not present

## 2020-02-19 ENCOUNTER — Telehealth: Payer: Self-pay | Admitting: *Deleted

## 2020-02-19 NOTE — Telephone Encounter (Signed)
I have sent a request for prior authorization through [email or web address] for Zenpep 40,000 units 2 capsules 3 times daily with each meal and 1 capsule with snacks (up to 8 capsules daily) to [place].   We will await insurance response which should be within the next 72 business hours.   Of note: Patient has exocrine pancreatic insufficiency (K86.81). She has tried Creon in the past which was not nearly as effective as the zenpep has been for her.

## 2020-03-15 ENCOUNTER — Other Ambulatory Visit: Payer: Self-pay | Admitting: Family Medicine

## 2020-06-27 DIAGNOSIS — M13849 Other specified arthritis, unspecified hand: Secondary | ICD-10-CM | POA: Diagnosis not present

## 2020-06-27 DIAGNOSIS — M79641 Pain in right hand: Secondary | ICD-10-CM | POA: Diagnosis not present

## 2020-06-27 DIAGNOSIS — M79642 Pain in left hand: Secondary | ICD-10-CM | POA: Diagnosis not present

## 2020-06-27 DIAGNOSIS — M720 Palmar fascial fibromatosis [Dupuytren]: Secondary | ICD-10-CM | POA: Diagnosis not present

## 2020-07-05 ENCOUNTER — Telehealth: Payer: Self-pay | Admitting: *Deleted

## 2020-07-05 DIAGNOSIS — R140 Abdominal distension (gaseous): Secondary | ICD-10-CM

## 2020-07-05 DIAGNOSIS — K8681 Exocrine pancreatic insufficiency: Secondary | ICD-10-CM

## 2020-07-05 DIAGNOSIS — R195 Other fecal abnormalities: Secondary | ICD-10-CM

## 2020-07-05 NOTE — Telephone Encounter (Signed)
We have attempted prior authorization for patient's Zenpep 40,000-126,000 units for her pancreatic insufficiency. Unfortunately, we have gotten a faxed response from [employer] dated 07/04/20 (reference [id number]) stating that Zenpep has been denied.  "This medication may be covered for members who have pancreatic insufficiency (when the pancrease doesn't make enough of the enzymes the body needs digest food) due to cystic fibrosis (an inherited disorder that causes severe damage to the lungs, digestive system and other organs in the body), pancreatitis (swelling of the pancreas), or when the pancreas has been removed by surgery.  This medication may also be covered when pancreatic insufficiency has been confirmed by specific function tests.  In this case, the member does not have pancreatic insufficiency caused by one of the above conditions, or confirmed by functions tests."  Of note: Patient has already tried and failed creon.

## 2020-07-06 NOTE — Telephone Encounter (Signed)
Please make patient aware that her insurance company is giving a hard time with coverage of Zenpep, which is not new However if we can document definitive pancreatic insufficiency by lab test her insurance may cover this I am very confident in the diagnosis of pancreatic insufficiency given her response to pancreatic enzyme supplementation, however if she would like [place] to try to get this medication covered we could have her stop the medication for 1 week and submit a fecal elastase This test is not perfect but if it were low it would be evidence to provide her insurance company to try to argue strongly for full drug coverage Ultimately up to her

## 2020-07-07 NOTE — Telephone Encounter (Signed)
I have spoken to patient to advise of insurance denial of Zenpep as well as Dr Vena Rua offer to order fecal elastase to prove insufficiency and hopefully get insurance approval. Patient is agreeable to completing stool study as cost of medication without insurance approval would be unattainable at $9,000 monthly. She has been advised to hold Zenpep x 1 week, then send in stool study. Orders placed in Epic.

## 2020-07-18 ENCOUNTER — Other Ambulatory Visit: Payer: BC Managed Care – PPO

## 2020-07-18 DIAGNOSIS — R140 Abdominal distension (gaseous): Secondary | ICD-10-CM

## 2020-07-18 DIAGNOSIS — K8681 Exocrine pancreatic insufficiency: Secondary | ICD-10-CM | POA: Diagnosis not present

## 2020-07-18 DIAGNOSIS — R195 Other fecal abnormalities: Secondary | ICD-10-CM

## 2020-07-20 ENCOUNTER — Other Ambulatory Visit: Payer: Self-pay | Admitting: Physician Assistant

## 2020-07-22 ENCOUNTER — Telehealth: Payer: Self-pay

## 2020-07-22 NOTE — Telephone Encounter (Signed)
Prior Authorization has been started for Pantoprazole.

## 2020-07-23 LAB — PANCREATIC ELASTASE, FECAL: Pancreatic Elastase-1, Stool: >500 mcg/g

## 2020-07-28 ENCOUNTER — Other Ambulatory Visit: Payer: Self-pay | Admitting: Internal Medicine

## 2020-07-29 ENCOUNTER — Telehealth: Payer: Self-pay

## 2020-07-29 NOTE — Telephone Encounter (Signed)
Fax rec'd from [place].  They have reviewed and approved the request FOR ZENPEP [id number] UNITS.  Approval period: 07-01-2020 through 07-01-2021.  BC/BS: [phone]. Reference #[id number]

## 2020-07-29 NOTE — Telephone Encounter (Signed)
Zenpep has been approved today after my peer to peer conversation with [employer]. Approval code is same as previous denial code: [id number]  Approved x 1 year and backdated to 06/29/20 We will likely have to repeat peer to peer in 1 yr  [place] news for her. Thanks [employer]

## 2020-08-03 NOTE — Telephone Encounter (Signed)
Approvedon February 25 Effective from 07/22/2020 through 07/21/2021.

## 2020-08-30 ENCOUNTER — Ambulatory Visit: Payer: BC Managed Care – PPO | Admitting: Podiatry

## 2020-09-12 ENCOUNTER — Telehealth: Payer: Self-pay | Admitting: Family Medicine

## 2020-09-12 ENCOUNTER — Other Ambulatory Visit: Payer: Self-pay | Admitting: Internal Medicine

## 2020-09-14 ENCOUNTER — Other Ambulatory Visit: Payer: Self-pay

## 2020-09-14 MED ORDER — PAROXETINE HCL 10 MG PO TABS: ORAL_TABLET | ORAL | 0 refills | Status: DC

## 2020-09-14 NOTE — Telephone Encounter (Signed)
PARoxetine (PAXIL) 10 MG tablet    [id number] [place], [place] - [address] Phone:  [phone] Fax:  [phone]

## 2020-09-14 NOTE — Telephone Encounter (Signed)
[facility] [place]

## 2020-09-26 ENCOUNTER — Other Ambulatory Visit: Payer: Self-pay | Admitting: Family Medicine

## 2020-09-30 ENCOUNTER — Encounter: Payer: BC Managed Care – PPO | Admitting: Family Medicine

## 2020-10-03 ENCOUNTER — Ambulatory Visit (INDEPENDENT_AMBULATORY_CARE_PROVIDER_SITE_OTHER): Payer: BC Managed Care – PPO | Admitting: Family Medicine

## 2020-10-03 ENCOUNTER — Other Ambulatory Visit: Payer: Self-pay

## 2020-10-03 VITALS — BP 108/50 | HR 68 | Temp 98.5°F | Ht 65.0 in | Wt 119.8 lb

## 2020-10-03 DIAGNOSIS — Z0000 Encounter for general adult medical examination without abnormal findings: Secondary | ICD-10-CM

## 2020-10-03 LAB — CBC WITH DIFFERENTIAL/PLATELET
Basophils Absolute: 0.1 10*3/uL (ref 0.0–0.1)
Basophils Relative: 1.9 % (ref 0.0–3.0)
Eosinophils Absolute: 0.3 10*3/uL (ref 0.0–0.7)
Eosinophils Relative: 8.8 % — ABNORMAL HIGH (ref 0.0–5.0)
HCT: 36.2 % (ref 36.0–46.0)
Hemoglobin: 12.1 g/dL (ref 12.0–15.0)
Lymphocytes Relative: 35.4 % (ref 12.0–46.0)
Lymphs Abs: 1.3 10*3/uL (ref 0.7–4.0)
MCHC: 33.4 g/dL (ref 30.0–36.0)
MCV: 93.2 fl (ref 78.0–100.0)
Monocytes Absolute: 0.3 10*3/uL (ref 0.1–1.0)
Monocytes Relative: 7.9 % (ref 3.0–12.0)
Neutro Abs: 1.7 10*3/uL (ref 1.4–7.7)
Neutrophils Relative %: 46 % (ref 43.0–77.0)
Platelets: 177 10*3/uL (ref 150.0–400.0)
RBC: 3.89 Mil/uL (ref 3.87–5.11)
RDW: 13.2 % (ref 11.5–15.5)
WBC: 3.7 10*3/uL — ABNORMAL LOW (ref 4.0–10.5)

## 2020-10-03 LAB — LIPID PANEL
Cholesterol: 187 mg/dL (ref 0–200)
HDL: 72.5 mg/dL (ref >39.00)
LDL Cholesterol: 102 mg/dL — ABNORMAL HIGH (ref 0–99)
NonHDL: 114.61
Total CHOL/HDL Ratio: 3
Triglycerides: 65 mg/dL (ref 0.0–149.0)
VLDL: 13 mg/dL (ref 0.0–40.0)

## 2020-10-03 LAB — HEPATIC FUNCTION PANEL
ALT: 15 U/L (ref 0–35)
AST: 20 U/L (ref 0–37)
Albumin: 4.4 g/dL (ref 3.5–5.2)
Alkaline Phosphatase: 46 U/L (ref 39–117)
Bilirubin, Direct: 0.1 mg/dL (ref 0.0–0.3)
Total Bilirubin: 0.6 mg/dL (ref 0.2–1.2)
Total Protein: 6.1 g/dL (ref 6.0–8.3)

## 2020-10-03 LAB — BASIC METABOLIC PANEL
BUN: 11 mg/dL (ref 6–23)
CO2: 33 mEq/L — ABNORMAL HIGH (ref 19–32)
Calcium: 9.5 mg/dL (ref 8.4–10.5)
Chloride: 104 mEq/L (ref 96–112)
Creatinine, Ser: 0.84 mg/dL (ref 0.40–1.20)
GFR: 75.11 mL/min (ref >60.00)
Glucose, Bld: 65 mg/dL — ABNORMAL LOW (ref 70–99)
Potassium: 4.2 mEq/L (ref 3.5–5.1)
Sodium: 143 mEq/L (ref 135–145)

## 2020-10-03 LAB — TSH: TSH: 3.75 u[IU]/mL (ref 0.35–4.50)

## 2020-10-03 MED ORDER — ALBUTEROL SULFATE HFA 108 (90 BASE) MCG/ACT IN AERS: 2.0000 | INHALATION_SPRAY | RESPIRATORY_TRACT | 1 refills | Status: DC | PRN

## 2020-10-03 NOTE — Progress Notes (Signed)
Established Patient Office Visit  Subjective:  Patient ID: Vicki [name], female    DOB: 09-08-1960  Age: 60 y.o. MRN: [id number]  CC:  Chief Complaint  Patient presents with  . Annual Exam    HPI Vicki [name] presents for physical exam.  She is seen by gynecologist yearly.  She has history of migraine headaches, mild intermittent asthma, celiac disease, GERD, IBS.  She is followed by gastroenterology.  Avoids gluten's.  Does request refill of albuterol which she uses infrequently.  She has had some allergy issues this year particularly.  Health maintenance reviewed  -She is getting regular Pap smears and at least every other year mammogram through GYN -Tetanus due 2026 -Colonoscopy apparently due 8/22 -She declines hepatitis C screening.  She is low risk. -Had previous Zostavax.  She had reaction to that vaccine and declined Shingrix  Family history reviewed-both her parents were alcoholics.  Her father died of stroke complications.  Mother and sister had breast cancer.  They were both BRCA negative.  Social history-married with 4 children.  She retired 2 years ago.  No history of smoking.  Rare alcohol.  Exercises regularly.  Past Medical History:  Diagnosis Date  . Allergy   . Anxiety    hx ?panic disorder  . Asthma   . Celiac disease   . Chronic gastritis   . GERD (gastroesophageal reflux disease)   . Hyperplastic colon polyp   . Migraines    chronic  . Schatzki's ring   . Skin cancer    chin area  . Thyroid disease     Past Surgical History:  Procedure Laterality Date  . ABLATION  2012  . CESAREAN SECTION  1984  . COLONOSCOPY  2011  . DILATION AND CURETTAGE OF UTERUS  2012  . POLYPECTOMY    . TONSILLECTOMY AND ADENOIDECTOMY  1964  . TUBAL LIGATION  1998  . UPPER GASTROINTESTINAL ENDOSCOPY  2011,1998    Family History  Problem Relation Age of Onset  . Alcohol abuse Mother   . Cancer Mother        breast  . Alcohol abuse Father   . Stroke  Father   . Asthma Paternal Aunt   . Colon cancer Paternal Aunt   . Arthritis Maternal Grandmother   . Cancer Sister 76       breast cancer  . Lupus Sister   . Irritable bowel syndrome Sister   . Colon cancer Paternal Grandfather   . Heart disease Neg Hx   . Esophageal cancer Neg Hx   . Stomach cancer Neg Hx   . Rectal cancer Neg Hx     Social History   Socioeconomic History  . Marital status: Married    Spouse name: Carlis Abbott  . Number of children: 4  . Years of education: Not on file  . Highest education level: Not on file  Occupational History  . Occupation: retired Therapist, sports  Tobacco Use  . Smoking status: Never Smoker  . Smokeless tobacco: Never Used  Vaping Use  . Vaping Use: Never used  Substance and Sexual Activity  . Alcohol use: No  . Drug use: No  . Sexual activity: Not on file  Other Topics Concern  . Not on file  Social History Narrative  . Not on file   Social Determinants of Health   Financial Resource Strain: Not on file  Food Insecurity: Not on file  Transportation Needs: Not on file  Physical Activity: Not on file  Stress: Not on file  Social Connections: Not on file  Intimate Partner Violence: Not on file    Outpatient Medications Prior to Visit  Medication Sig Dispense Refill  . mometasone (NASONEX) 50 MCG/ACT nasal spray USE 2 SPRAYS IN EACH NOSTRIL EVER DAY 51 each 0  . Multiple Vitamins-Minerals (WOMENS MULTIVITAMIN PLUS PO) Take by mouth daily.    . Pancrelipase, Lip-Prot-Amyl, (ZENPEP) 40000-126000 units CPEP Take 2 capsules by mouth 3 (three) times daily with meals. And 1 capsule with snacks (total up to 8 capsules daily) 720 capsule 3  . baclofen (LIORESAL) 10 MG tablet Take 10 mg by mouth every 8 (eight) hours as needed (for migraines).    . diphenoxylate-atropine (LOMOTIL) 2.5-0.025 MG tablet TAKE 1 TABLET BY MOUTH 4 TIMES A DAY AS NEEDED FOR DIARRHEA OR LOOSE STOOL 120 tablet 1  . estradiol (ESTRACE) 0.1 MG/GM vaginal cream Place vaginally.  (Patient not taking: Reported on 10/03/2020)    . pantoprazole (PROTONIX) 40 MG tablet Take 1 tablet (40 mg total) by mouth daily. 90 tablet 1  . PARoxetine (PAXIL) 10 MG tablet TAKE 1 TABLET BY MOUTH EVERY DAY IN THE MORNING 30 tablet 0  . pseudoephedrine (SUDAFED) 30 MG tablet Take by mouth.    . SUMAtriptan (IMITREX) 100 MG tablet TAKE 1 TABLET BY MOUTH EVERY 2 HOURS AS NEEDED FOR MIGRAINE 9 tablet 1  . valACYclovir (VALTREX) 1000 MG tablet Take two at onset of cold sore and repeat 2 in 12 hours. 30 tablet 1  . albuterol (PROVENTIL HFA;VENTOLIN HFA) 108 (90 Base) MCG/ACT inhaler INHALE 2 PUFFS INTO THE LUNGS EVERY 4 (FOUR) HOURS AS NEEDED FOR WHEEZING OR SHORTNESS OF BREATH. 8.5 Inhaler 0   No facility-administered medications prior to visit.    Allergies  Allergen Reactions  . Lamisil [Terbinafine] Hives  . Ceclor [Cefaclor]     hives  . Cephalosporins   . Latex   . Zoster Vaccine Live Rash    ROS Review of Systems  Constitutional: Negative for activity change, appetite change, fatigue, fever and unexpected weight change.  HENT: Negative for ear pain, hearing loss, sore throat and trouble swallowing.   Eyes: Negative for visual disturbance.  Respiratory: Negative for cough and shortness of breath.   Cardiovascular: Negative for chest pain and palpitations.  Gastrointestinal: Negative for abdominal pain, blood in stool, constipation and diarrhea.  Endocrine: Negative for polydipsia and polyuria.  Genitourinary: Negative for dysuria and hematuria.  Musculoskeletal: Negative for arthralgias, back pain and myalgias.  Skin: Negative for rash.  Neurological: Negative for dizziness, syncope and headaches.  Hematological: Negative for adenopathy.  Psychiatric/Behavioral: Negative for confusion and dysphoric mood.      Objective:    Physical Exam Constitutional:      Appearance: She is well-developed.  HENT:     Head: Normocephalic and atraumatic.  Eyes:     Pupils: Pupils are  equal, round, and reactive to light.  Neck:     Thyroid: No thyromegaly.  Cardiovascular:     Rate and Rhythm: Normal rate and regular rhythm.     Heart sounds: Normal heart sounds. No murmur heard.   Pulmonary:     Effort: No respiratory distress.     Breath sounds: Normal breath sounds. No wheezing or rales.  Abdominal:     General: Bowel sounds are normal. There is no distension.     Palpations: Abdomen is soft. There is no mass.     Tenderness: There is no abdominal tenderness. There is no  guarding or rebound.  Musculoskeletal:        General: Normal range of motion.     Cervical back: Normal range of motion and neck supple.     Right lower leg: No edema.     Left lower leg: No edema.  Lymphadenopathy:     Cervical: No cervical adenopathy.  Skin:    Findings: No rash.  Neurological:     Mental Status: She is alert and oriented to person, place, and time.     Cranial Nerves: No cranial nerve deficit.  Psychiatric:        Behavior: Behavior normal.        Thought Content: Thought content normal.        Judgment: Judgment normal.     BP (!) 108/50 (BP Location: Left Arm, Patient Position: Sitting, Cuff Size: Normal)   Pulse 68   Temp 98.5 F (36.9 C) (Oral)   Ht _0  (1.651 m)   Wt 119 lb 12.8 oz (54.3 kg)   SpO2 99%   BMI 19.94 kg/m  Wt Readings from Last 3 Encounters:  10/03/20 119 lb 12.8 oz (54.3 kg)  01/27/20 116 lb (52.6 kg)  03/24/19 119 lb (54 kg)     Health Maintenance Due  Topic Date Due  . HIV Screening  Never done  . COVID-19 Vaccine (3 - Moderna risk 4-dose series) 09/05/2019    There are no preventive care reminders to display for this patient.  Lab Results  Component Value Date   TSH 2.50 10/23/2018   Lab Results  Component Value Date   WBC 3.4 (L) 05/28/2019   HGB 12.2 05/28/2019   HCT 36.8 05/28/2019   MCV 94.0 05/28/2019   PLT 220.0 05/28/2019   Lab Results  Component Value Date   NA 140 05/28/2019   K 4.2 05/28/2019   CO2  31 05/28/2019   GLUCOSE 93 05/28/2019   BUN 12 05/28/2019   CREATININE 0.81 05/28/2019   BILITOT 0.5 05/28/2019   ALKPHOS 43 05/28/2019   AST 20 05/28/2019   ALT 12 05/28/2019   PROT 6.4 05/28/2019   ALBUMIN 4.2 05/28/2019   CALCIUM 9.6 05/28/2019   GFR 72.16 05/28/2019   Lab Results  Component Value Date   CHOL 184 03/23/2014   Lab Results  Component Value Date   HDL 71.60 03/23/2014   Lab Results  Component Value Date   LDLCALC 105 (H) 03/23/2014   Lab Results  Component Value Date   TRIG 39.0 03/23/2014   Lab Results  Component Value Date   CHOLHDL 3 03/23/2014   No results found for: HGBA1C    Assessment & Plan:   Problem List Items Addressed This Visit   None   Visit Diagnoses    Physical exam    -  Primary   Relevant Orders   Basic metabolic panel   Lipid panel   CBC with Differential/Platelet   TSH   Hepatic function panel    Healthy 60 year old female.  We discussed the following health maintenance issues  -She plans to continue with GYN follow-up regarding her Pap smears and mammograms -Obtain screening labs -Recommend daily calcium 1200 mg and vitamin D 800-1,000 international units daily -Recommend regular weightbearing exercise -Discussed Shingrix but she declines. -Continue annual flu vaccine -Reminder about colonoscopy later this year.  Meds ordered this encounter  Medications  . albuterol (VENTOLIN HFA) 108 (90 Base) MCG/ACT inhaler    Sig: Inhale 2 puffs into the lungs every 4 (four)  hours as needed for wheezing or shortness of breath.    Dispense:  8.5 each    Refill:  1    Follow-up: No follow-ups on file.    Carolann Littler, MD

## 2020-10-03 NOTE — Patient Instructions (Signed)
Preventive Care 61-61 Years Old, Female Preventive care refers to lifestyle choices and visits with your health care provider that can promote health and wellness. This includes:  A yearly physical exam. This is also called an annual wellness visit.  Regular dental and eye exams.  Immunizations.  Screening for certain conditions.  Healthy lifestyle choices, such as: ? Eating a healthy diet. ? Getting regular exercise. ? Not using drugs or products that contain nicotine and tobacco. ? Limiting alcohol use. What can I expect for my preventive care visit? Physical exam Your health care provider will check your:  Height and weight. These may be used to calculate your BMI (body mass index). BMI is a measurement that tells if you are at a healthy weight.  Heart rate and blood pressure.  Body temperature.  Skin for abnormal spots. Counseling Your health care provider may ask you questions about your:  Past medical problems.  Family's medical history.  Alcohol, tobacco, and drug use.  Emotional well-being.  Home life and relationship well-being.  Sexual activity.  Diet, exercise, and sleep habits.  Work and work Statistician.  Access to firearms.  Method of birth control.  Menstrual cycle.  Pregnancy history. What immunizations do I need? Vaccines are usually given at various ages, according to a schedule. Your health care provider will recommend vaccines for you based on your age, medical history, and lifestyle or other factors, such as travel or where you work.   What tests do I need? Blood tests  Lipid and cholesterol levels. These may be checked every 5 years, or more often if you are over 3 years old.  Hepatitis C test.  Hepatitis B test. Screening  Lung cancer screening. You may have this screening every year starting at age 73 if you have a 30-pack-year history of smoking and currently smoke or have quit within the past 15 years.  Colorectal cancer  screening. ? All adults should have this screening starting at age 52 and continuing until age 17. ? Your health care provider may recommend screening at age 49 if you are at increased risk. ? You will have tests every 1-10 years, depending on your results and the type of screening test.  Diabetes screening. ? This is done by checking your blood sugar (glucose) after you have not eaten for a while (fasting). ? You may have this done every 1-3 years.  Mammogram. ? This may be done every 1-2 years. ? Talk with your health care provider about when you should start having regular mammograms. This may depend on whether you have a family history of breast cancer.  BRCA-related cancer screening. This may be done if you have a family history of breast, ovarian, tubal, or peritoneal cancers.  Pelvic exam and Pap test. ? This may be done every 3 years starting at age 10. ? Starting at age 11, this may be done every 5 years if you have a Pap test in combination with an HPV test. Other tests  STD (sexually transmitted disease) testing, if you are at risk.  Bone density scan. This is done to screen for osteoporosis. You may have this scan if you are at high risk for osteoporosis. Talk with your health care provider about your test results, treatment options, and if necessary, the need for more tests. Follow these instructions at home: Eating and drinking  Eat a diet that includes fresh fruits and vegetables, whole grains, lean protein, and low-fat dairy products.  Take vitamin and mineral supplements  as recommended by your health care provider.  Do not drink alcohol if: ? Your health care provider tells you not to drink. ? You are pregnant, may be pregnant, or are planning to become pregnant.  If you drink alcohol: ? Limit how much you have to 0-1 drink a day. ? Be aware of how much alcohol is in your drink. In the [place] one drink equals one 12 oz bottle of beer (355 mL), one 5 oz glass of  wine (148 mL), or one 1 oz glass of hard liquor (44 mL).   Lifestyle  Take daily care of your teeth and gums. Brush your teeth every morning and night with fluoride toothpaste. Floss one time each day.  Stay active. Exercise for at least 30 minutes 5 or more days each week.  Do not use any products that contain nicotine or tobacco, such as cigarettes, e-cigarettes, and chewing tobacco. If you need help quitting, ask your health care provider.  Do not use drugs.  If you are sexually active, practice safe sex. Use a condom or other form of protection to prevent STIs (sexually transmitted infections).  If you do not wish to become pregnant, use a form of birth control. If you plan to become pregnant, see your health care provider for a prepregnancy visit.  If told by your health care provider, take low-dose aspirin daily starting at age 50.  Find healthy ways to cope with stress, such as: ? Meditation, yoga, or listening to music. ? Journaling. ? Talking to a trusted person. ? Spending time with friends and family. Safety  Always wear your seat belt while driving or riding in a vehicle.  Do not drive: ? If you have been drinking alcohol. Do not ride with someone who has been drinking. ? When you are tired or distracted. ? While texting.  Wear a helmet and other protective equipment during sports activities.  If you have firearms in your house, make sure you follow all gun safety procedures. What's next?  Visit your health care provider once a year for an annual wellness visit.  Ask your health care provider how often you should have your eyes and teeth checked.  Stay up to date on all vaccines. This information is not intended to replace advice given to you by your health care provider. Make sure you discuss any questions you have with your health care provider. Document Revised: 02/16/2020 Document Reviewed: 01/23/2018 Elsevier Patient Education  2021 Elsevier Inc.  

## 2020-10-06 ENCOUNTER — Other Ambulatory Visit: Payer: Self-pay | Admitting: Family Medicine

## 2020-10-26 ENCOUNTER — Encounter: Payer: Self-pay | Admitting: Family Medicine

## 2020-10-26 MED ORDER — TRIAMCINOLONE ACETONIDE 0.1 % EX CREA: 1.0000 "application " | TOPICAL_CREAM | Freq: Two times a day (BID) | CUTANEOUS | 1 refills | Status: DC | PRN

## 2020-11-11 ENCOUNTER — Ambulatory Visit (INDEPENDENT_AMBULATORY_CARE_PROVIDER_SITE_OTHER): Payer: BC Managed Care – PPO | Admitting: Family Medicine

## 2020-11-11 ENCOUNTER — Encounter: Payer: Self-pay | Admitting: Family Medicine

## 2020-11-11 ENCOUNTER — Other Ambulatory Visit: Payer: Self-pay

## 2020-11-11 VITALS — BP 118/58 | HR 59 | Temp 97.6°F | Wt 119.8 lb

## 2020-11-11 DIAGNOSIS — F410 Panic disorder [episodic paroxysmal anxiety] without agoraphobia: Secondary | ICD-10-CM | POA: Diagnosis not present

## 2020-11-11 MED ORDER — ALPRAZOLAM 0.25 MG PO TABS: ORAL_TABLET | ORAL | 0 refills | Status: DC
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
## 2020-11-11 NOTE — Patient Instructions (Signed)
Increase the Paxil to 5 mg twice daily.   Let me know in 2-3 weeks if not improving.

## 2020-12-13 DIAGNOSIS — Z1212 Encounter for screening for malignant neoplasm of rectum: Secondary | ICD-10-CM | POA: Diagnosis not present

## 2020-12-13 DIAGNOSIS — Z01419 Encounter for gynecological examination (general) (routine) without abnormal findings: Secondary | ICD-10-CM | POA: Diagnosis not present

## 2020-12-19 ENCOUNTER — Other Ambulatory Visit: Payer: Self-pay | Admitting: Family Medicine

## 2021-01-05 ENCOUNTER — Other Ambulatory Visit: Payer: Self-pay

## 2021-01-06 ENCOUNTER — Ambulatory Visit (INDEPENDENT_AMBULATORY_CARE_PROVIDER_SITE_OTHER): Payer: BC Managed Care – PPO | Admitting: Family Medicine

## 2021-01-06 VITALS — BP 118/58 | HR 65 | Temp 98.4°F | Wt 123.4 lb

## 2021-01-06 DIAGNOSIS — R059 Cough, unspecified: Secondary | ICD-10-CM

## 2021-01-06 MED ORDER — AZITHROMYCIN 250 MG PO TABS: ORAL_TABLET | ORAL | 0 refills | Status: AC

## 2021-01-06 NOTE — Progress Notes (Signed)
Established Patient Office Visit  Subjective:  Patient ID: Vicki [name], female    DOB: [birth year]  Age: 60 y.o. MRN: [id number]  CC:  Chief Complaint  Patient presents with   Shortness of Breath    Pt states she had what felt like a head cold a few weeks ago, negative for covid, most symptoms have subsided but still has some drainage and SOB when active    HPI Nate J Brosnahan presents for some persistent cough and shortness of breath following recent URI with cough.  She states that one of their sons was visiting and will be 2 weeks ago the Sunday that she developed symptoms of some cough, postnasal drainage, hoarseness.  No fever.  She did 4 different COVID test which were all negative.  Her voice has returned.  Her main concern now is that she has some dyspnea with activity.  No chest pain.  No pleuritic pain.  No acute onset dyspnea.  No hemoptysis.  Not aware of any wheezing.  No history of asthma.  Past Medical History:  Diagnosis Date   Allergy    Anxiety    hx ?panic disorder   Asthma    Celiac disease    Chronic gastritis    GERD (gastroesophageal reflux disease)    Hyperplastic colon polyp    Migraines    chronic   Schatzki's ring    Skin cancer    chin area   Thyroid disease     Past Surgical History:  Procedure Laterality Date   ABLATION  2012   CESAREAN SECTION  1984   COLONOSCOPY  2011   DILATION AND CURETTAGE OF UTERUS  2012   POLYPECTOMY     TONSILLECTOMY AND ADENOIDECTOMY  1964   TUBAL LIGATION  1998   UPPER GASTROINTESTINAL ENDOSCOPY  2011,1998    Family History  Problem Relation Age of Onset   Alcohol abuse Mother    Cancer Mother        breast   Alcohol abuse Father    Stroke Father    Asthma Paternal Aunt    Colon cancer Paternal Aunt    Arthritis Maternal Grandmother    Cancer Sister 32       breast cancer   Lupus Sister    Irritable bowel syndrome Sister    Colon cancer Paternal Grandfather    Heart disease Neg Hx     Esophageal cancer Neg Hx    Stomach cancer Neg Hx    Rectal cancer Neg Hx     Social History   Socioeconomic History   Marital status: Married    Spouse name: Interior and spatial designer   Number of children: 4   Years of education: Not on file   Highest education level: Not on file  Occupational History   Occupation: retired Therapist, sports  Tobacco Use   Smoking status: Never   Smokeless tobacco: Never  Vaping Use   Vaping Use: Never used  Substance and Sexual Activity   Alcohol use: No   Drug use: No   Sexual activity: Not on file  Other Topics Concern   Not on file  Social History Narrative   Not on file   Social Determinants of Health   Financial Resource Strain: Not on file  Food Insecurity: Not on file  Transportation Needs: Not on file  Physical Activity: Not on file  Stress: Not on file  Social Connections: Not on file  Intimate Partner Violence: Not on file    Outpatient Medications  Prior to Visit  Medication Sig Dispense Refill   albuterol (VENTOLIN HFA) 108 (90 Base) MCG/ACT inhaler Inhale 2 puffs into the lungs every 4 (four) hours as needed for wheezing or shortness of breath. 8.5 each 1   ALPRAZolam (XANAX) 0.25 MG tablet Take one to two tablets by mouth one hour prior to flying. 20 tablet 0   baclofen (LIORESAL) 10 MG tablet Take 10 mg by mouth every 8 (eight) hours as needed (for migraines).     diphenoxylate-atropine (LOMOTIL) 2.5-0.025 MG tablet TAKE 1 TABLET BY MOUTH 4 TIMES A DAY AS NEEDED FOR DIARRHEA OR LOOSE STOOL 120 tablet 1   estradiol (ESTRACE) 0.1 MG/GM vaginal cream Place vaginally.     mometasone (NASONEX) 50 MCG/ACT nasal spray USE 2 SPRAYS IN EACH NOSTRIL EVERY DAY 51 each 0   Multiple Vitamins-Minerals (WOMENS MULTIVITAMIN PLUS PO) Take by mouth daily.     Pancrelipase, Lip-Prot-Amyl, (ZENPEP) 40000-126000 units CPEP Take 2 capsules by mouth 3 (three) times daily with meals. And 1 capsule with snacks (total up to 8 capsules daily) 720 capsule 3   PARoxetine (PAXIL) 10  MG tablet TAKE 1 TABLET BY MOUTH EVERY DAY IN THE MORNING 90 tablet 1   pseudoephedrine (SUDAFED) 30 MG tablet Take by mouth.     SUMAtriptan (IMITREX) 100 MG tablet TAKE 1 TABLET BY MOUTH EVERY 2 HOURS AS NEEDED FOR MIGRAINE 9 tablet 1   triamcinolone cream (KENALOG) 0.1 % Apply 1 application topically 2 (two) times daily as needed. 15 g 1   valACYclovir (VALTREX) 1000 MG tablet Take two at onset of cold sore and repeat 2 in 12 hours. 30 tablet 1   No facility-administered medications prior to visit.    Allergies  Allergen Reactions   Lamisil [Terbinafine] Hives   Ceclor [Cefaclor]     hives   Cephalosporins    Latex    Zoster Vaccine Live Rash    ROS Review of Systems  Constitutional:  Negative for chills and fever.  HENT:  Positive for postnasal drip.   Respiratory:  Positive for cough and shortness of breath. Negative for wheezing.   Cardiovascular:  Negative for chest pain, palpitations and leg swelling.     Objective:    Physical Exam Vitals reviewed.  Constitutional:      Appearance: She is well-developed.  Cardiovascular:     Rate and Rhythm: Normal rate and regular rhythm.  Pulmonary:     Breath sounds: Normal breath sounds. No decreased breath sounds, wheezing or rales.  Musculoskeletal:     Cervical back: Neck supple.     Right lower leg: No edema.     Left lower leg: No edema.  Neurological:     Mental Status: She is alert.    BP (!) 118/58 (BP Location: Left Arm, Patient Position: Sitting, Cuff Size: Normal)   Pulse 65   Temp 98.4 F (36.9 C) (Oral)   Wt 123 lb 6.4 oz (56 kg)   SpO2 99%   BMI 20.53 kg/m  Wt Readings from Last 3 Encounters:  01/06/21 123 lb 6.4 oz (56 kg)  11/11/20 119 lb 12.8 oz (54.3 kg)  10/03/20 119 lb 12.8 oz (54.3 kg)     Health Maintenance Due  Topic Date Due   Pneumococcal Vaccine 28-68 Years old (1 - PCV) Never done   HIV Screening  Never done   Zoster Vaccines- Shingrix (1 of 2) Never done   COVID-19 Vaccine (3 -  Moderna risk series) 09/05/2019  COLONOSCOPY (Pts 45-94yr Insurance coverage will need to be confirmed)  12/26/2020    There are no preventive care reminders to display for this patient.  Lab Results  Component Value Date   TSH 3.75 10/03/2020   Lab Results  Component Value Date   WBC 3.7 (L) 10/03/2020   HGB 12.1 10/03/2020   HCT 36.2 10/03/2020   MCV 93.2 10/03/2020   PLT 177.0 10/03/2020   Lab Results  Component Value Date   NA 143 10/03/2020   K 4.2 10/03/2020   CO2 33 (H) 10/03/2020   GLUCOSE 65 (L) 10/03/2020   BUN 11 10/03/2020   CREATININE 0.84 10/03/2020   BILITOT 0.6 10/03/2020   ALKPHOS 46 10/03/2020   AST 20 10/03/2020   ALT 15 10/03/2020   PROT 6.1 10/03/2020   ALBUMIN 4.4 10/03/2020   CALCIUM 9.5 10/03/2020   GFR 75.11 10/03/2020   Lab Results  Component Value Date   CHOL 187 10/03/2020   Lab Results  Component Value Date   HDL 72.50 10/03/2020   Lab Results  Component Value Date   LDLCALC 102 (H) 10/03/2020   Lab Results  Component Value Date   TRIG 65.0 10/03/2020   Lab Results  Component Value Date   CHOLHDL 3 10/03/2020   No results found for: HGBA1C    Assessment & Plan:   Cough.  Suspect recent acute viral upper respiratory infection with cough.  Multiple COVID tests were negative.  Patient specifically had concerns regarding atypical type infection.  She has no evidence for any wheezing on exam and pulse oximeter 99%.  No clinical suspicion for PE.  -We elected to go and cover with Zithromax for 5 days.  If her symptoms of shortness of breath are not improving by next week start with chest x-ray  Meds ordered this encounter  Medications   azithromycin (ZITHROMAX Z-PAK) 250 MG tablet    Sig: Take 2 tablets (500 mg) on  Day 1,  followed by 1 tablet (250 mg) once daily on Days 2 through 5.    Dispense:  6 each    Refill:  0    Follow-up: No follow-ups on file.    BCarolann Littler MD

## 2021-01-06 NOTE — Patient Instructions (Signed)
If symptoms not improving over the next week let me know and we can get CXR.

## 2021-01-09 ENCOUNTER — Encounter: Payer: Self-pay | Admitting: *Deleted

## 2021-01-22 ENCOUNTER — Encounter: Payer: Self-pay | Admitting: Family Medicine

## 2021-01-23 ENCOUNTER — Telehealth: Payer: Self-pay | Admitting: Family Medicine

## 2021-01-23 ENCOUNTER — Encounter: Payer: Self-pay | Admitting: Family Medicine

## 2021-01-23 DIAGNOSIS — R059 Cough, unspecified: Secondary | ICD-10-CM

## 2021-01-23 MED ORDER — AMOXICILLIN-POT CLAVULANATE 875-125 MG PO TABS: 1.0000 | ORAL_TABLET | Freq: Two times a day (BID) | ORAL | 0 refills | Status: DC

## 2021-01-23 NOTE — Telephone Encounter (Signed)
Spoke with the patient. She is aware of Dr. Erick Blinks message. She stated that the zpak did help but once she finished the zpak symptoms returned. She would like to know if we can send in another prescription or a different antibiotic.   Please advise.

## 2021-01-23 NOTE — Telephone Encounter (Signed)
Please advise 

## 2021-01-23 NOTE — Telephone Encounter (Signed)
Set up PA and lateral chest x-ray.  I was not sure regarding best location for her but I put in [facility] which does apparently allow walk-ins between 8 and 5

## 2021-01-23 NOTE — Telephone Encounter (Signed)
See multiple "patient advice encounters ".  Patient recently on Zithromax and had only some transient improvement.  She still has some cough.  Persistent sinusitis symptoms.  We will send in Augmentin and set up chest x-ray to further evaluate

## 2021-01-23 NOTE — Telephone Encounter (Signed)
Pt call and stated she sent a message on mychart to dr.Burchette and forgot to let him know that she have fluid in her left ear.

## 2021-01-23 NOTE — Telephone Encounter (Signed)
Spoke with the patient. She is aware of Dr. Erick Blinks message. She has agreed to imaging. Patient is aware that I will call her once these orders have been placed.

## 2021-01-24 NOTE — Telephone Encounter (Signed)
Spoke with the patient. She stated that her husband is a Marine scientist and she had him listen to her chest and he did not think she needs imaging. She does not feel that she needs a chest xray at this time. Will send to Dr. Elease Hashimoto as Juluis Rainier.

## 2021-01-24 NOTE — Telephone Encounter (Signed)
Spoke with the patient. She stated that her husband is a Marine scientist and she had him listen to her chest. She does not feel that she needs a chest xray at this time. Will send to Dr. Elease Hashimoto as Juluis Rainier.

## 2021-01-27 ENCOUNTER — Other Ambulatory Visit: Payer: Self-pay | Admitting: Internal Medicine

## 2021-02-14 DIAGNOSIS — G43109 Migraine with aura, not intractable, without status migrainosus: Secondary | ICD-10-CM | POA: Diagnosis not present

## 2021-02-14 DIAGNOSIS — G43709 Chronic migraine without aura, not intractable, without status migrainosus: Secondary | ICD-10-CM | POA: Diagnosis not present

## 2021-03-12 ENCOUNTER — Other Ambulatory Visit: Payer: Self-pay | Admitting: Internal Medicine

## 2021-03-12 ENCOUNTER — Other Ambulatory Visit: Payer: Self-pay | Admitting: Family Medicine

## 2021-03-13 ENCOUNTER — Other Ambulatory Visit: Payer: Self-pay | Admitting: Internal Medicine

## 2021-05-01 DIAGNOSIS — H2513 Age-related nuclear cataract, bilateral: Secondary | ICD-10-CM | POA: Diagnosis not present

## 2021-05-11 ENCOUNTER — Telehealth: Payer: Self-pay | Admitting: Internal Medicine

## 2021-05-11 ENCOUNTER — Other Ambulatory Visit: Payer: Self-pay | Admitting: Internal Medicine

## 2021-05-11 MED ORDER — ZENPEP 40000-126000 UNITS PO CPEP: ORAL_CAPSULE | ORAL | 0 refills | Status: DC

## 2021-05-11 NOTE — Telephone Encounter (Signed)
Rx sent 

## 2021-05-11 NOTE — Telephone Encounter (Signed)
Inbound call from patient requesting a medication refill for Zenpep 90 day prescription. Patient states medication is expensive and have met her deductible for the year and hopes she could get the 90 day prescription. She scheduled the next available appt 1/10 with APP. Send prescription to [facility] in [place].

## 2021-05-18 ENCOUNTER — Encounter: Payer: Self-pay | Admitting: Internal Medicine

## 2021-06-01 ENCOUNTER — Other Ambulatory Visit: Payer: Self-pay

## 2021-06-06 ENCOUNTER — Encounter: Payer: Self-pay | Admitting: Nurse Practitioner

## 2021-06-06 ENCOUNTER — Ambulatory Visit (INDEPENDENT_AMBULATORY_CARE_PROVIDER_SITE_OTHER): Payer: BC Managed Care – PPO | Admitting: Nurse Practitioner

## 2021-06-06 VITALS — BP 100/60 | HR 70 | Ht 65.0 in | Wt 122.0 lb

## 2021-06-06 DIAGNOSIS — K900 Celiac disease: Secondary | ICD-10-CM | POA: Diagnosis not present

## 2021-06-06 DIAGNOSIS — K8681 Exocrine pancreatic insufficiency: Secondary | ICD-10-CM

## 2021-06-06 NOTE — Patient Instructions (Signed)
If you are age 62 or younger, your body mass index should be between 19-25. Your Body mass index is 20.3 kg/m. If this is out of the aformentioned range listed, please consider follow up with your Primary Care Provider.   The [facility] [place] [facility] providers would like to encourage you to use [facility] to communicate with providers for non-urgent requests or questions.  Due to long hold times on the telephone, sending your provider a message by [facility] may be faster and more efficient way to get a response. Please allow 48 business hours for a response.  Please remember that this is for non-urgent requests/questions.  Continue Zeprep as prescribed and follow up in one year or as needed.  Your next colonoscopy is due August 2027  It was great seeing you today! Thank you for entrusting me with your care and choosing [facility].  Noralyn Pick, CRNP

## 2021-06-06 NOTE — Progress Notes (Signed)
06/06/2021 Vicki [name] [id number] 27-Oct-1960   Chief Complaint: Pancreatic insufficiency follow-up, Zenpep refill  History of Present Illness: Vicki [name] is a 61 year old female with a past medical history of celiac disease  initially diagnosed per EGD 2011, exocrine pancreatic insufficiency, IBS and GERD. She is followed by Dr. Hilarie [name]. She presents to our office today for her annual follow up. She remains on Zenpep with meals.  She takes Zenpep 1 or 2 capsules with breakfast, 2 capsule with lunch and dinner and 1 capsule with snacks.  She is passing a solid or "soft serve" brown stool once or twice daily.  She sometimes skips a day without passing a BM.  No greasy or floating stools.  No rectal bleeding or black stools.  No GERD symptoms.  She has gained 4 to 5 pounds over the past year.  She maintains 100% gluten free diet to the best of her ability. She reports feeling quite well at this time.  CBC Latest Ref Rng & Units 10/03/2020 05/28/2019 10/23/2018  WBC 4.0 - 10.5 K/uL 3.7(L) 3.4(L) 5.0  Hemoglobin 12.0 - 15.0 g/dL 12.1 12.2 12.0  Hematocrit 36.0 - 46.0 % 36.2 36.8 34.5(L)  Platelets 150.0 - 400.0 K/uL 177.0 220.0 185.0   CMP Latest Ref Rng & Units 10/03/2020 05/28/2019 10/23/2018  Glucose 70 - 99 mg/dL 65(L) 93 -  BUN 6 - 23 mg/dL 11 12 -  Creatinine 0.40 - 1.20 mg/dL 0.84 0.81 -  Sodium 135 - 145 mEq/L 143 140 -  Potassium 3.5 - 5.1 mEq/L 4.2 4.2 -  Chloride 96 - 112 mEq/L 104 102 -  CO2 19 - 32 mEq/L 33(H) 31 -  Calcium 8.4 - 10.5 mg/dL 9.5 9.6 9.6  Total Protein 6.0 - 8.3 g/dL 6.1 6.4 -  Total Bilirubin 0.2 - 1.2 mg/dL 0.6 0.5 -  Alkaline Phos 39 - 117 U/L 46 43 -  AST 0 - 37 U/L 20 20 -  ALT 0 - 35 U/L 15 12 -    Colonoscopy 12/27/2015: - The examined portion of the ileum was normal. - One 5 mm polyp in the descending colon, removed with a cold snare. Resected and retrieved. - The examination was otherwise normal on direct and retroflexion views. - 5 year  recall -HYPERPLASTIC COLONIC POLYP. -NO ADENOMATOUS CHANGE OR MALIGNANCY IDENTIFIED -Initial recall 5-year then changed by Dr. Hilarie [name] to 10-year. Next colonoscopy due 12/2025.  EGD 04/28/2010: Mild chronic reflux esophagitis Duodenal biopsies with villous atrophy consistent with celiac disease  Current Outpatient Medications on File Prior to Visit  Medication Sig Dispense Refill   albuterol (VENTOLIN HFA) 108 (90 Base) MCG/ACT inhaler Inhale 2 puffs into the lungs every 4 (four) hours as needed for wheezing or shortness of breath. 8.5 each 1   ALPRAZolam (XANAX) 0.25 MG tablet Take one to two tablets by mouth one hour prior to flying. 20 tablet 0   baclofen (LIORESAL) 10 MG tablet Take 10 mg by mouth every 8 (eight) hours as needed (for migraines).     diphenoxylate-atropine (LOMOTIL) 2.5-0.025 MG tablet TAKE 1 TABLET BY MOUTH 4 TIMES A DAY AS NEEDED FOR DIARRHEA OR LOOSE STOOL 120 tablet 1   mometasone (NASONEX) 50 MCG/ACT nasal spray USE 2 SPRAYS IN EACH NOSTRIL EVERY DAY 51 each 0   Multiple Vitamins-Minerals (WOMENS MULTIVITAMIN PLUS PO) Take by mouth daily.     Pancrelipase, Lip-Prot-Amyl, (ZENPEP) 40000-126000 units CPEP Take 2 capsules by mouth three times daily with meals  and 1 capsule with each snack (up to 8 capsules daily). NEEDS OFFICE VISIT FOR REFILLS 720 capsule 0   PARoxetine (PAXIL) 10 MG tablet TAKE 1 TABLET BY MOUTH EVERY DAY IN THE MORNING 90 tablet 1   pseudoephedrine (SUDAFED) 30 MG tablet Take by mouth.     SUMAtriptan (IMITREX) 100 MG tablet TAKE 1 TABLET BY MOUTH EVERY 2 HOURS AS NEEDED FOR MIGRAINE 9 tablet 1   valACYclovir (VALTREX) 1000 MG tablet Take two at onset of cold sore and repeat 2 in 12 hours. 30 tablet 1   No current facility-administered medications on file prior to visit.   Allergies  Allergen Reactions   Lamisil [Terbinafine] Hives   Wheat Bran     Other reaction(s): Respiratory Distress   Ceclor [Cefaclor]     hives   Cephalosporins    Latex     Zoster Vaccine Live Rash   Current Medications, Allergies, Past Medical History, Past Surgical History, Family History and Social History were reviewed in [employer] record.  Review of Systems:   Constitutional: Negative for fever, sweats, chills or weight loss.  Respiratory: Negative for shortness of breath.   Cardiovascular: Negative for chest pain, palpitations and leg swelling.  Gastrointestinal: See HPI.  Musculoskeletal: Negative for back pain or muscle aches.  Neurological: Negative for dizziness, headaches or paresthesias.   Physical Exam: BP 100/60    Pulse 70    Ht 5' 5"  (1.651 m)    Wt 122 lb (55.3 kg)    BMI 20.30 kg/m  Wt Readings from Last 3 Encounters:  06/06/21 122 lb (55.3 kg)  01/06/21 123 lb 6.4 oz (56 kg)  11/11/20 119 lb 12.8 oz (54.3 kg)    General: 61 year old female in no acute distress. Head: Normocephalic and atraumatic. Eyes: No scleral icterus. Conjunctiva pink . Ears: Normal auditory acuity. Mouth: Dentition intact. No ulcers or lesions.  Lungs: Clear throughout to auscultation. Heart: Regular rate and rhythm, no murmur. Abdomen: Soft, nontender and nondistended. No masses or hepatomegaly. Normal bowel sounds x 4 quadrants.  Rectal: Deferred.  Musculoskeletal: Symmetrical with no gross deformities. Extremities: No edema. Neurological: Alert oriented x 4. No focal deficits.  Psychological: Alert and cooperative. Normal mood and affect  Assessment and Recommendations:  1) Celiac disease, stable -Continue gluten-free diet  2) Exocrine insufficiency  -Continue Zenpep (40,000 - 126,000 units) 2 caps with meals, 1 cap with snacks  -Follow-up in the office in 1 year and as needed  3) IBS, well controlled   4) GERD, asymptomatic.  Feels best off acid reducing medications.  5) History of one adenomatous colon polyp in 2011. One hyperplastic removed from the descending colon per colonoscopy 12/2015. -Next colonoscopy due  12/2025

## 2021-06-19 NOTE — Progress Notes (Signed)
Addendum: Reviewed and agree with assessment and management plan. ,  M, MD  

## 2021-07-09 ENCOUNTER — Telehealth: Payer: BC Managed Care – PPO | Admitting: Physician Assistant

## 2021-07-09 DIAGNOSIS — J019 Acute sinusitis, unspecified: Secondary | ICD-10-CM | POA: Diagnosis not present

## 2021-07-09 DIAGNOSIS — B9689 Other specified bacterial agents as the cause of diseases classified elsewhere: Secondary | ICD-10-CM | POA: Diagnosis not present

## 2021-07-09 MED ORDER — DOXYCYCLINE HYCLATE 100 MG PO TABS
100.0000 mg | ORAL_TABLET | Freq: Two times a day (BID) | ORAL | 0 refills | Status: DC
Start: 2021-07-09 — End: 2021-08-11

## 2021-07-09 MED ORDER — BENZONATATE 100 MG PO CAPS: 100.0000 mg | ORAL_CAPSULE | Freq: Three times a day (TID) | ORAL | 0 refills | Status: DC | PRN

## 2021-07-09 MED ORDER — FLUTICASONE PROPIONATE 50 MCG/ACT NA SUSP: 2.0000 | Freq: Every day | NASAL | 0 refills | Status: DC

## 2021-07-09 NOTE — Progress Notes (Signed)
E-Visit for Sinus Problems  We are sorry that you are not feeling well.  Here is how we plan to help!  Based on what you have shared with me it looks like you have sinusitis.  Sinusitis is inflammation and infection in the sinus cavities of the head.  Based on your presentation I believe you most likely have Acute Bacterial Sinusitis.  This is an infection caused by bacteria and is treated with antibiotics. I have prescribed Doxycycline 157m by mouth twice a day for 10 days.  I will prescribe fluticasone nasal spray for the sinus inflammation. I will also prescribe tessalon perles for the cough. You may use an oral decongestant such as Mucinex D or if you have glaucoma or high blood pressure use plain Mucinex. Saline nasal spray help and can safely be used as often as needed for congestion.  If you develop worsening sinus pain, fever or notice severe headache and vision changes, or if symptoms are not better after completion of antibiotic, please schedule an appointment with a health care provider.    Sinus infections are not as easily transmitted as other respiratory infection, however we still recommend that you avoid close contact with loved ones, especially the very young and elderly.  Remember to wash your hands thoroughly throughout the day as this is the number one way to prevent the spread of infection!  Home Care: Only take medications as instructed by your medical team. Complete the entire course of an antibiotic. Do not take these medications with alcohol. A steam or ultrasonic humidifier can help congestion.  You can place a towel over your head and breathe in the steam from hot water coming from a faucet. Avoid close contacts especially the very young and the elderly. Cover your mouth when you cough or sneeze. Always remember to wash your hands.  Get Help Right Away If: You develop worsening fever or sinus pain. You develop a severe head ache or visual changes. Your symptoms persist  after you have completed your treatment plan.  Make sure you Understand these instructions. Will watch your condition. Will get help right away if you are not doing well or get worse.  Thank you for choosing an e-visit.  Your e-visit answers were reviewed by a board certified advanced clinical practitioner to complete your personal care plan. Depending upon the condition, your plan could have included both over the counter or prescription medications.  Please review your pharmacy choice. Make sure the pharmacy is open so you can pick up prescription now. If there is a problem, you may contact your provider through [employer] have the prescription routed to another pharmacy.  Your safety is important to [place] If you have drug allergies check your prescription carefully.   For the next 24 hours you can use MyChart to ask questions about today's visit, request a non-urgent call back, or ask for a work or school excuse. You will get an email in the next two days asking about your experience. I hope that your e-visit has been valuable and will speed your recovery.  I provided 5 minutes of non face-to-face time during this encounter for chart review and documentation.

## 2021-08-10 DIAGNOSIS — M65332 Trigger finger, left middle finger: Secondary | ICD-10-CM | POA: Diagnosis not present

## 2021-08-10 DIAGNOSIS — M720 Palmar fascial fibromatosis [Dupuytren]: Secondary | ICD-10-CM | POA: Insufficient documentation

## 2021-08-10 DIAGNOSIS — M65341 Trigger finger, right ring finger: Secondary | ICD-10-CM | POA: Diagnosis not present

## 2021-08-10 DIAGNOSIS — M65331 Trigger finger, right middle finger: Secondary | ICD-10-CM | POA: Diagnosis not present

## 2021-08-10 DIAGNOSIS — M65342 Trigger finger, left ring finger: Secondary | ICD-10-CM | POA: Insufficient documentation

## 2021-08-11 ENCOUNTER — Telehealth (INDEPENDENT_AMBULATORY_CARE_PROVIDER_SITE_OTHER): Payer: BC Managed Care – PPO | Admitting: Adult Health

## 2021-08-11 ENCOUNTER — Encounter: Payer: Self-pay | Admitting: Adult Health

## 2021-08-11 VITALS — HR 89 | Temp 98.2°F | Ht 65.0 in | Wt 120.0 lb

## 2021-08-11 DIAGNOSIS — U071 COVID-19: Secondary | ICD-10-CM | POA: Diagnosis not present

## 2021-08-11 MED ORDER — NIRMATRELVIR/RITONAVIR (PAXLOVID)TABLET: 3.0000 | ORAL_TABLET | Freq: Two times a day (BID) | ORAL | 0 refills | Status: AC

## 2021-08-11 NOTE — Progress Notes (Signed)
Virtual Visit via Video Note ? ?I connected with Vicki [name] on 08/11/21 at  4:15 PM EDT by a video enabled telemedicine application and verified that I am speaking with the correct person using two identifiers. ? Location patient: home ?Location provider:work or home office ?Persons participating in the virtual visit: patient, provider ? ?I discussed the limitations of evaluation and management by telemedicine and the availability of in person appointments. The patient expressed understanding and agreed to proceed. ? ? ?HPI: ?61 year old female who is being evaluated today for an acute issue.  She reports that her husband tested positive for COVID yesterday.  She was not have any symptoms until earlier today when she noticed that she could not taste her lunch.  Since that time she has developed a dry cough, nasal congestion, hoarse voice, and sore throat.  She denies fevers, chills, shortness of breath, or wheezing. ? ?Her husband was started on Paxlovid and she would like to have the same medication ? ? ?ROS: See pertinent positives and negatives per HPI. ? ?Past Medical History:  ?Diagnosis Date  ? Allergy   ? Anxiety   ? hx ?panic disorder  ? Asthma   ? Celiac disease   ? Chronic gastritis   ? GERD (gastroesophageal reflux disease)   ? Hyperplastic colon polyp   ? Migraines   ? chronic  ? Schatzki's ring   ? Skin cancer   ? chin area  ? Thyroid disease   ? ? ?Past Surgical History:  ?Procedure Laterality Date  ? ABLATION  2012  ? [place]  ? COLONOSCOPY  2011  ? DILATION AND CURETTAGE OF UTERUS  2012  ? POLYPECTOMY    ? [place]  ? TUBAL LIGATION  1998  ? UPPER GASTROINTESTINAL ENDOSCOPY  2011,1998  ? ? ?Family History  ?Problem Relation Age of Onset  ? Alcohol abuse Mother   ? Breast cancer Mother   ? Alcohol abuse Father   ? Stroke Father   ? Breast cancer Sister 51  ? Lupus Sister   ? Irritable bowel syndrome Sister   ? Arthritis Maternal Grandmother   ? Colon  cancer Paternal Grandfather   ? Asthma Paternal Aunt   ? Colon cancer Paternal Aunt   ? Heart disease Neg Hx   ? Esophageal cancer Neg Hx   ? Stomach cancer Neg Hx   ? Rectal cancer Neg Hx   ? ? ? ? ? ?Current Outpatient Medications:  ?  nirmatrelvir/ritonavir EUA (PAXLOVID) 20 x 150 MG & 10 x 100MG TABS, Take 3 tablets by mouth 2 (two) times daily for 5 days. (Take nirmatrelvir 150 mg two tablets twice daily for 5 days and ritonavir 100 mg one tablet twice daily for 5 days) Patient GFR is 75, Disp: 30 tablet, Rfl: 0 ?  albuterol (VENTOLIN HFA) 108 (90 Base) MCG/ACT inhaler, Inhale 2 puffs into the lungs every 4 (four) hours as needed for wheezing or shortness of breath., Disp: 8.5 each, Rfl: 1 ?  ALPRAZolam (XANAX) 0.25 MG tablet, Take one to two tablets by mouth one hour prior to flying., Disp: 20 tablet, Rfl: 0 ?  baclofen (LIORESAL) 10 MG tablet, Take 10 mg by mouth every 8 (eight) hours as needed (for migraines)., Disp: , Rfl:  ?  diphenoxylate-atropine (LOMOTIL) 2.5-0.025 MG tablet, TAKE 1 TABLET BY MOUTH 4 TIMES A DAY AS NEEDED FOR DIARRHEA OR LOOSE STOOL, Disp: 120 tablet, Rfl: 1 ?  mometasone (NASONEX) 50 MCG/ACT  nasal spray, USE 2 SPRAYS IN EACH NOSTRIL EVERY DAY, Disp: 51 each, Rfl: 0 ?  Multiple Vitamins-Minerals (WOMENS MULTIVITAMIN PLUS PO), Take by mouth daily., Disp: , Rfl:  ?  Pancrelipase, Lip-Prot-Amyl, (ZENPEP) 40000-126000 units CPEP, Take 2 capsules by mouth three times daily with meals and 1 capsule with each snack (up to 8 capsules daily). NEEDS OFFICE VISIT FOR REFILLS, Disp: 720 capsule, Rfl: 0 ?  PARoxetine (PAXIL) 10 MG tablet, TAKE 1 TABLET BY MOUTH EVERY DAY IN THE MORNING, Disp: 90 tablet, Rfl: 1 ?  pseudoephedrine (SUDAFED) 30 MG tablet, Take by mouth., Disp: , Rfl:  ?  SUMAtriptan (IMITREX) 100 MG tablet, TAKE 1 TABLET BY MOUTH EVERY 2 HOURS AS NEEDED FOR MIGRAINE, Disp: 9 tablet, Rfl: 1 ?  valACYclovir (VALTREX) 1000 MG tablet, Take two at onset of cold sore and repeat 2 in 12  hours., Disp: 30 tablet, Rfl: 1 ? ?EXAM: ? ?VITALS per patient if applicable: ? ?GENERAL: alert, oriented, appears well and in no acute distress ? ?HEENT: atraumatic, conjunttiva clear, no obvious abnormalities on inspection of external nose and ears ? ?NECK: normal movements of the head and neck ? ?LUNGS: on inspection no signs of respiratory distress, breathing rate appears normal, no obvious gross SOB, gasping or wheezing ? ?CV: no obvious cyanosis ? ?MS: moves all visible extremities without noticeable abnormality ? ?PSYCH/NEURO: pleasant and cooperative, no obvious depression or anxiety, speech and thought processing grossly intact ? ?ASSESSMENT AND PLAN: ? ?Discussed the following assessment and plan: ? ?1. COVID-19 virus infection ?- nirmatrelvir/ritonavir EUA (PAXLOVID) 20 x 150 MG & 10 x 100MG TABS; Take 3 tablets by mouth 2 (two) times daily for 5 days. (Take nirmatrelvir 150 mg two tablets twice daily for 5 days and ritonavir 100 mg one tablet twice daily for 5 days) Patient GFR is 75  Dispense: 30 tablet; Refill: 0 ? ? ? ? ?  ?I discussed the assessment and treatment plan with the patient. The patient was provided an opportunity to ask questions and all were answered. The patient agreed with the plan and demonstrated an understanding of the instructions. ?  ?The patient was advised to call back or seek an in-person evaluation if the symptoms worsen or if the condition fails to improve as anticipated. ? ? ?Dorothyann Peng, NP  ? ?

## 2021-08-12 DIAGNOSIS — M19049 Primary osteoarthritis, unspecified hand: Secondary | ICD-10-CM | POA: Insufficient documentation

## 2021-10-16 DIAGNOSIS — D1801 Hemangioma of skin and subcutaneous tissue: Secondary | ICD-10-CM | POA: Diagnosis not present

## 2021-10-16 DIAGNOSIS — L578 Other skin changes due to chronic exposure to nonionizing radiation: Secondary | ICD-10-CM | POA: Diagnosis not present

## 2021-10-16 DIAGNOSIS — L814 Other melanin hyperpigmentation: Secondary | ICD-10-CM | POA: Diagnosis not present

## 2021-10-16 DIAGNOSIS — L821 Other seborrheic keratosis: Secondary | ICD-10-CM | POA: Diagnosis not present

## 2021-11-02 DIAGNOSIS — M25561 Pain in right knee: Secondary | ICD-10-CM | POA: Diagnosis not present

## 2021-12-11 ENCOUNTER — Other Ambulatory Visit: Payer: Self-pay | Admitting: Internal Medicine

## 2021-12-11 ENCOUNTER — Other Ambulatory Visit: Payer: Self-pay | Admitting: Family Medicine

## 2021-12-12 ENCOUNTER — Other Ambulatory Visit (HOSPITAL_COMMUNITY): Payer: Self-pay

## 2021-12-12 ENCOUNTER — Telehealth: Payer: Self-pay | Admitting: Pharmacy Technician

## 2021-12-12 NOTE — Telephone Encounter (Signed)
Patient Advocate Encounter  Received notification from [employer] that prior authorization for ZENPEP 40000 is required.   PA submitted on 7.18.23 Key [id number] Status is pending    Luciano Cutter, CPhT Patient Advocate Phone: [phone]

## 2021-12-14 NOTE — Telephone Encounter (Signed)
Patient Advocate Encounter  Prior Authorization for Zenpep has been approved.    Rx #: [id number] Effective dates: 12/12/2021 through 12/11/2022  Clista Bernhardt, CPhT Pharmacy Patient Advocate Specialist [place] Patient Advocate Team Phone: [phone]   Fax: [phone]

## 2021-12-21 ENCOUNTER — Telehealth: Payer: BC Managed Care – PPO | Admitting: Physician Assistant

## 2021-12-21 DIAGNOSIS — J019 Acute sinusitis, unspecified: Secondary | ICD-10-CM

## 2021-12-21 DIAGNOSIS — B9689 Other specified bacterial agents as the cause of diseases classified elsewhere: Secondary | ICD-10-CM

## 2021-12-21 MED ORDER — BENZONATATE 100 MG PO CAPS: 100.0000 mg | ORAL_CAPSULE | Freq: Three times a day (TID) | ORAL | 0 refills | Status: DC | PRN

## 2021-12-21 MED ORDER — DOXYCYCLINE HYCLATE 100 MG PO TABS
100.0000 mg | ORAL_TABLET | Freq: Two times a day (BID) | ORAL | 0 refills | Status: DC
Start: 2021-12-21 — End: 2022-01-08

## 2021-12-21 NOTE — Progress Notes (Signed)
I have spent 5 minutes in review of e-visit questionnaire, review and updating patient chart, medical decision making and response to patient.    [place] , PA-C    

## 2021-12-21 NOTE — Progress Notes (Signed)
E-Visit for Sinus Problems  We are sorry that you are not feeling well.  Here is how we plan to help!  Based on what you have shared with me it looks like you have sinusitis.  Sinusitis is inflammation and infection in the sinus cavities of the head.  Based on your presentation I believe you most likely have Acute Bacterial Sinusitis.  This is an infection caused by bacteria and is treated with antibiotics. I have prescribed Doxycycline 111m by mouth twice a day for 10 days. You may use an oral decongestant such as Mucinex D or if you have glaucoma or high blood pressure use plain Mucinex. I have also sent in a prescription cough medication to take as directed. Saline nasal spray help and can safely be used as often as needed for congestion.  If you develop worsening sinus pain, fever or notice severe headache and vision changes, or if symptoms are not better after completion of antibiotic, please schedule an appointment with a health care provider.    Sinus infections are not as easily transmitted as other respiratory infection, however we still recommend that you avoid close contact with loved ones, especially the very young and elderly.  Remember to wash your hands thoroughly throughout the day as this is the number one way to prevent the spread of infection!  Home Care: Only take medications as instructed by your medical team. Complete the entire course of an antibiotic. Do not take these medications with alcohol. A steam or ultrasonic humidifier can help congestion.  You can place a towel over your head and breathe in the steam from hot water coming from a faucet. Avoid close contacts especially the very young and the elderly. Cover your mouth when you cough or sneeze. Always remember to wash your hands.  Get Help Right Away If: You develop worsening fever or sinus pain. You develop a severe head ache or visual changes. Your symptoms persist after you have completed your treatment  plan.  Make sure you Understand these instructions. Will watch your condition. Will get help right away if you are not doing well or get worse.  Thank you for choosing an e-visit.  Your e-visit answers were reviewed by a board certified advanced clinical practitioner to complete your personal care plan. Depending upon the condition, your plan could have included both over the counter or prescription medications.  Please review your pharmacy choice. Make sure the pharmacy is open so you can pick up prescription now. If there is a problem, you may contact your provider through [employer] have the prescription routed to another pharmacy.  Your safety is important to [place] If you have drug allergies check your prescription carefully.   For the next 24 hours you can use MyChart to ask questions about today's visit, request a non-urgent call back, or ask for a work or school excuse. You will get an email in the next two days asking about your experience. I hope that your e-visit has been valuable and will speed your recovery.

## 2021-12-25 ENCOUNTER — Other Ambulatory Visit (HOSPITAL_COMMUNITY): Payer: Self-pay

## 2021-12-29 ENCOUNTER — Other Ambulatory Visit (HOSPITAL_COMMUNITY): Payer: Self-pay

## 2022-01-08 ENCOUNTER — Ambulatory Visit (INDEPENDENT_AMBULATORY_CARE_PROVIDER_SITE_OTHER): Payer: BC Managed Care – PPO | Admitting: Family Medicine

## 2022-01-08 ENCOUNTER — Encounter: Payer: Self-pay | Admitting: Family Medicine

## 2022-01-08 VITALS — BP 96/60 | HR 62 | Temp 98.2°F | Ht 65.0 in | Wt 126.6 lb

## 2022-01-08 DIAGNOSIS — J0111 Acute recurrent frontal sinusitis: Secondary | ICD-10-CM

## 2022-01-08 MED ORDER — PREDNISONE 10 MG PO TABS: ORAL_TABLET | ORAL | 0 refills | Status: DC

## 2022-01-08 MED ORDER — AMOXICILLIN-POT CLAVULANATE 875-125 MG PO TABS: 1.0000 | ORAL_TABLET | Freq: Two times a day (BID) | ORAL | 0 refills | Status: DC

## 2022-01-08 NOTE — Progress Notes (Signed)
Established Patient Office Visit  Subjective   Patient ID: Vicki [name], female    DOB: [birth year]  Age: 61 y.o. MRN: [id number]  Chief Complaint  Patient presents with   Nasal Congestion    Patient complains of nasal congestion, x3 weeks, Tried Doxycycline, Sudafed and Afrin nasal spray with little relief    Facial Pain    Patient complains of right sided facial pain, x3 weeks, Tried Sudafed, Robitussin and Tylenol with little relief   Cough    Patient complains of cough, Non-productive cough,     HPI   Vicki [name] is here with several week history of sinusitis symptoms.  She had nasal congestion, malaise, fatigue intermittent headaches, facial pain especially right frontal sinus, cough, laryngitis.  She actually saw someone about 3 weeks ago was treated doxycycline and felt some better but then symptoms relapsed after coming off the doxycycline.  Denies any fever.  Has done okay with Augmentin in the past.  Allergy to cephalosporin.  Home COVID testing negative.  Past Medical History:  Diagnosis Date   Allergy    Anxiety    hx ?panic disorder   Asthma    Celiac disease    Chronic gastritis    GERD (gastroesophageal reflux disease)    Hyperplastic colon polyp    Migraines    chronic   Schatzki's ring    Skin cancer    chin area   Thyroid disease    Past Surgical History:  Procedure Laterality Date   ABLATION  2012   CESAREAN SECTION  1984   COLONOSCOPY  2011   DILATION AND CURETTAGE OF UTERUS  2012   POLYPECTOMY     TONSILLECTOMY AND [place]   UPPER GASTROINTESTINAL ENDOSCOPY  2011,1998    reports that she has never smoked. She has never used smokeless tobacco. She reports that she does not drink alcohol and does not use drugs. family history includes Alcohol abuse in her father and mother; Arthritis in her maternal grandmother; Asthma in her paternal aunt; Breast cancer in her mother; Breast cancer (age of onset: 38) in her sister;  Colon cancer in her paternal aunt and paternal grandfather; Irritable bowel syndrome in her sister; Lupus in her sister; Stroke in her father. Allergies  Allergen Reactions   Lamisil [Terbinafine] Hives   Wheat Bran     Other reaction(s): Respiratory Distress   Ceclor [Cefaclor]     hives   Cephalosporins    Latex    Zoster Vaccine Live Rash    Review of Systems  Constitutional:  Positive for malaise/fatigue. Negative for chills and fever.  HENT:  Positive for congestion and sinus pain. Negative for ear pain.   Respiratory:  Positive for cough.       Objective:     BP 96/60 (BP Location: Left Arm, Patient Position: Sitting, Cuff Size: Normal)   Pulse 62   Temp 98.2 F (36.8 C) (Oral)   Ht 5' 5"  (1.651 m)   Wt 126 lb 9.6 oz (57.4 kg)   SpO2 98%   BMI 21.07 kg/m    Physical Exam Vitals reviewed.  Constitutional:      Appearance: Normal appearance.  HENT:     Right Ear: Tympanic membrane normal.     Left Ear: Tympanic membrane normal.     Mouth/Throat:     Mouth: Mucous membranes are moist.     Pharynx: Oropharynx is clear. No oropharyngeal exudate.  Cardiovascular:  Rate and Rhythm: Normal rate and regular rhythm.  Pulmonary:     Effort: Pulmonary effort is normal.     Breath sounds: Normal breath sounds. No wheezing or rales.  Musculoskeletal:     Cervical back: Neck supple.  Lymphadenopathy:     Cervical: No cervical adenopathy.  Neurological:     Mental Status: She is alert.      No results found for any visits on 01/08/22.    The 10-year ASCVD risk score (Arnett DK, et al., 2019) is: 1.9%    Assessment & Plan:   Chronic sinusitis versus acute recurrent.  Patient has had several weeks of symptoms as above.  Start Augmentin 875 mg twice daily for 10 days.  Prednisone taper over 10 days.  Touch base for any persistent or worsening symptoms.  No follow-ups on file.    Carolann Littler, MD

## 2022-01-11 ENCOUNTER — Other Ambulatory Visit (HOSPITAL_COMMUNITY): Payer: Self-pay

## 2022-01-15 DIAGNOSIS — M79642 Pain in left hand: Secondary | ICD-10-CM | POA: Diagnosis not present

## 2022-01-15 DIAGNOSIS — M79641 Pain in right hand: Secondary | ICD-10-CM | POA: Diagnosis not present

## 2022-01-15 DIAGNOSIS — M65331 Trigger finger, right middle finger: Secondary | ICD-10-CM | POA: Diagnosis not present

## 2022-01-15 DIAGNOSIS — M65341 Trigger finger, right ring finger: Secondary | ICD-10-CM | POA: Diagnosis not present

## 2022-01-23 DIAGNOSIS — Z1151 Encounter for screening for human papillomavirus (HPV): Secondary | ICD-10-CM | POA: Diagnosis not present

## 2022-01-23 DIAGNOSIS — Z01419 Encounter for gynecological examination (general) (routine) without abnormal findings: Secondary | ICD-10-CM | POA: Diagnosis not present

## 2022-01-23 DIAGNOSIS — N898 Other specified noninflammatory disorders of vagina: Secondary | ICD-10-CM | POA: Diagnosis not present

## 2022-01-23 DIAGNOSIS — Z1212 Encounter for screening for malignant neoplasm of rectum: Secondary | ICD-10-CM | POA: Diagnosis not present

## 2022-01-31 ENCOUNTER — Other Ambulatory Visit: Payer: Self-pay | Admitting: Internal Medicine

## 2022-01-31 ENCOUNTER — Other Ambulatory Visit: Payer: Self-pay | Admitting: Family Medicine

## 2022-02-01 NOTE — Telephone Encounter (Signed)
Vicki [name] you saw this patient in January 2023. Can we refill this rx for this patient? Dr Hilarie Fredrickson is out of the office this week.

## 2022-02-15 DIAGNOSIS — G43709 Chronic migraine without aura, not intractable, without status migrainosus: Secondary | ICD-10-CM | POA: Diagnosis not present

## 2022-04-11 ENCOUNTER — Other Ambulatory Visit: Payer: Self-pay | Admitting: Family Medicine

## 2022-05-10 ENCOUNTER — Other Ambulatory Visit: Payer: Self-pay | Admitting: Family Medicine

## 2022-05-22 ENCOUNTER — Ambulatory Visit (INDEPENDENT_AMBULATORY_CARE_PROVIDER_SITE_OTHER): Payer: BC Managed Care – PPO | Admitting: Family Medicine

## 2022-05-22 ENCOUNTER — Encounter: Payer: Self-pay | Admitting: Family Medicine

## 2022-05-22 VITALS — BP 120/60 | HR 67 | Temp 98.2°F | Ht 65.0 in | Wt 127.5 lb

## 2022-05-22 DIAGNOSIS — F410 Panic disorder [episodic paroxysmal anxiety] without agoraphobia: Secondary | ICD-10-CM

## 2022-05-22 DIAGNOSIS — J4520 Mild intermittent asthma, uncomplicated: Secondary | ICD-10-CM | POA: Diagnosis not present

## 2022-05-22 MED ORDER — PAROXETINE HCL 10 MG PO TABS: ORAL_TABLET | ORAL | 3 refills | Status: DC

## 2022-05-22 MED ORDER — ALBUTEROL SULFATE HFA 108 (90 BASE) MCG/ACT IN AERS: 2.0000 | INHALATION_SPRAY | RESPIRATORY_TRACT | 1 refills | Status: DC | PRN

## 2022-05-22 NOTE — Progress Notes (Signed)
Established Patient Office Visit  Subjective   Patient ID: Vicki [name], female    DOB: [birth year]  Age: 61 y.o. MRN: [id number]  Chief Complaint  Patient presents with   Medication Consultation    HPI   [name] is seen to consult regarding several items as follows  Longstanding history of anxiety.  She was diagnosed with panic disorder several years ago.  Has been on Paxil for years.  At one point she was on 40 mg but eventually able to taper down.  Recently has been on 5 mg.  She has had symptoms of weight gain and some fatigue even at 10 mg dosage.  However, she had concerns that at the 5 mg dose she had some recent breakthrough and anxiety symptoms.  This is particularly noted when she is out driving.  She has inability to drive places like interstate.  She does not recall trying other SSRI medications in the past.  She has history of mild intermittent asthma and requesting refills of albuterol.  Infrequent use.  Past Medical History:  Diagnosis Date   Allergy    Anxiety    hx ?panic disorder   Asthma    Celiac disease    Chronic gastritis    GERD (gastroesophageal reflux disease)    Hyperplastic colon polyp    Migraines    chronic   Schatzki's ring    Skin cancer    chin area   Thyroid disease    Past Surgical History:  Procedure Laterality Date   ABLATION  2012   CESAREAN SECTION  1984   COLONOSCOPY  2011   DILATION AND CURETTAGE OF UTERUS  2012   POLYPECTOMY     TONSILLECTOMY AND [place]   UPPER GASTROINTESTINAL ENDOSCOPY  2011,1998    reports that she has never smoked. She has never used smokeless tobacco. She reports that she does not drink alcohol and does not use drugs. family history includes Alcohol abuse in her father and mother; Arthritis in her maternal grandmother; Asthma in her paternal aunt; Breast cancer in her mother; Breast cancer (age of onset: 80) in her sister; Colon cancer in her paternal aunt and paternal  grandfather; Irritable bowel syndrome in her sister; Lupus in her sister; Stroke in her father. Allergies  Allergen Reactions   Lamisil [Terbinafine] Hives   Wheat Bran     Other reaction(s): Respiratory Distress   Ceclor [Cefaclor]     hives   Cephalosporins    Latex    Zoster Vaccine Live Rash    Review of Systems  Constitutional:  Negative for weight loss.  Respiratory:  Negative for shortness of breath.   Cardiovascular:  Negative for chest pain.  Psychiatric/Behavioral:  Negative for suicidal ideas. The patient is nervous/anxious.       Objective:     BP 120/60 (BP Location: Left Arm, Patient Position: Sitting, Cuff Size: Normal)   Pulse 67   Temp 98.2 F (36.8 C) (Oral)   Ht 5' 5"  (1.651 m)   Wt 127 lb 8 oz (57.8 kg)   SpO2 100%   BMI 21.22 kg/m    Physical Exam Vitals reviewed.  Constitutional:      General: She is not in acute distress.    Appearance: Normal appearance.  Cardiovascular:     Rate and Rhythm: Normal rate and regular rhythm.  Pulmonary:     Effort: Pulmonary effort is normal.     Breath sounds: Normal  breath sounds. No wheezing or rales.  Neurological:     Mental Status: She is alert.  Psychiatric:        Mood and Affect: Mood normal.        Thought Content: Thought content normal.      No results found for any visits on 05/22/22.    The 10-year ASCVD risk score (Arnett DK, et al., 2019) is: 2.9%    Assessment & Plan:   #1 history of anxiety symptoms with reported "panic disorder ".  Patient currently on very low-dose Paxil.  We explained that 5 mg usually inadequate to suppress panic symptoms in many patients.  We agreed to go ahead and titrate this up to 10 mg.  We did discuss potentially looking at other SSRI medications that would have less risk for weight gain.  We also discussed possible pharmaco-genomic testing to determine possible best medications for treating her anxiety and she will look into insurance coverage for  that.  #2 mild intermittent asthma.  Refill albuterol for as needed use. Carolann Littler, MD

## 2022-05-22 NOTE — Patient Instructions (Signed)
See if your insurance with cover pharmaco-genomic testing to look at psychotropic medications.

## 2022-05-31 ENCOUNTER — Other Ambulatory Visit: Payer: Self-pay | Admitting: Family Medicine

## 2022-05-31 ENCOUNTER — Encounter: Payer: Self-pay | Admitting: Family Medicine

## 2022-05-31 DIAGNOSIS — Z1379 Encounter for other screening for genetic and chromosomal anomalies: Secondary | ICD-10-CM

## 2022-06-04 ENCOUNTER — Other Ambulatory Visit: Payer: Self-pay

## 2022-06-04 ENCOUNTER — Other Ambulatory Visit: Payer: BC Managed Care – PPO

## 2022-06-04 DIAGNOSIS — Z1379 Encounter for other screening for genetic and chromosomal anomalies: Secondary | ICD-10-CM

## 2022-06-04 LAB — ACTX PHARMACOGENOMICS SERVICE-BLOOD

## 2022-07-02 DIAGNOSIS — C44311 Basal cell carcinoma of skin of nose: Secondary | ICD-10-CM | POA: Diagnosis not present

## 2022-07-02 DIAGNOSIS — D485 Neoplasm of uncertain behavior of skin: Secondary | ICD-10-CM | POA: Diagnosis not present

## 2022-07-10 DIAGNOSIS — H2513 Age-related nuclear cataract, bilateral: Secondary | ICD-10-CM | POA: Diagnosis not present

## 2022-07-16 ENCOUNTER — Ambulatory Visit (INDEPENDENT_AMBULATORY_CARE_PROVIDER_SITE_OTHER): Payer: BC Managed Care – PPO | Admitting: Podiatry

## 2022-07-16 ENCOUNTER — Encounter: Payer: Self-pay | Admitting: Podiatry

## 2022-07-16 ENCOUNTER — Ambulatory Visit (INDEPENDENT_AMBULATORY_CARE_PROVIDER_SITE_OTHER): Payer: BC Managed Care – PPO

## 2022-07-16 DIAGNOSIS — M79672 Pain in left foot: Secondary | ICD-10-CM | POA: Diagnosis not present

## 2022-07-16 DIAGNOSIS — M722 Plantar fascial fibromatosis: Secondary | ICD-10-CM | POA: Diagnosis not present

## 2022-07-16 MED ORDER — MELOXICAM 15 MG PO TABS: 15.0000 mg | ORAL_TABLET | Freq: Every day | ORAL | 0 refills | Status: DC

## 2022-07-16 MED ORDER — DEXAMETHASONE SODIUM PHOSPHATE 120 MG/30ML IJ SOLN
4.0000 mg | Freq: Once | INTRAMUSCULAR | Status: AC
  Administered 2022-07-16: 4 mg via INTRA_ARTICULAR

## 2022-07-16 NOTE — Progress Notes (Signed)
  Subjective:  Patient ID: Vicki [name], female    DOB: [birth year],   MRN: [id number]  Chief Complaint  Patient presents with   Foot Pain     Left heel pain - on going for about 1 month     62 y.o. female presents for concern of left heel pain that has been going on for about a month. Relates first steps in the morning ar the worse. She has had plantar fasciiits on the right in the past and has a brace she started to wear and is wearing tennis shoes in the house. States injections in the past helped the right . Denies any other pedal complaints. Denies n/v/f/c.   Past Medical History:  Diagnosis Date   Allergy    Anxiety    hx ?panic disorder   Asthma    Celiac disease    Chronic gastritis    GERD (gastroesophageal reflux disease)    Hyperplastic colon polyp    Migraines    chronic   Schatzki's ring    Skin cancer    chin area   Thyroid disease     Objective:  Physical Exam: Vascular: DP/PT pulses 2/4 bilateral. CFT <3 seconds. Normal hair growth on digits. No edema.  Skin. No lacerations or abrasions bilateral feet.  Musculoskeletal: MMT 5/5 bilateral lower extremities in DF, PF, Inversion and Eversion. Deceased ROM in DF of ankle joint. Tender to the medial calcaneal tubercle on the left. No pain with calcaneal squeeze and no pain along achilles or PT tendon.  Neurological: Sensation intact to light touch.   Assessment:   1. Plantar fasciitis, left   2. Pain of left heel [M79.672]      Plan:  Patient was evaluated and treated and all questions answered. Discussed plantar fasciitis with patient.  X-rays reviewed and discussed with patient. No acute fractures or dislocations noted. Mild spurring noted at inferior calcaneus.  Reviewed previous physician notes Discussed treatment options including, ice, NSAIDS, supportive shoes, bracing, and stretching. Stretching exercises provided to be done on a daily basis.   Prescription for meloxicam provided and sent to  pharmacy. Patient requesting injection today. Procedure note below.   Follow-up 6 weeks or sooner if any problems arise. In the meantime, encouraged to call the office with any questions, concerns, change in symptoms.   Procedure:  Discussed etiology, pathology, conservative vs. surgical therapies. At this time a plantar fascial injection was recommended.  The patient agreed and a sterile skin prep was applied.  An injection consisting of  dexamethasone and marcaine mixture was infiltrated at the point of maximal tenderness on the left Heel.  Bandaid applied. The patient tolerated this well and was given instructions for aftercare.     Lorenda Peck, DPM

## 2022-07-16 NOTE — Patient Instructions (Signed)

## 2022-08-01 ENCOUNTER — Encounter: Payer: Self-pay | Admitting: Podiatry

## 2022-08-01 ENCOUNTER — Other Ambulatory Visit: Payer: Self-pay | Admitting: Podiatry

## 2022-08-01 MED ORDER — METHYLPREDNISOLONE 4 MG PO TBPK: ORAL_TABLET | ORAL | 0 refills | Status: DC

## 2022-08-03 ENCOUNTER — Telehealth: Payer: Self-pay

## 2022-08-03 NOTE — Telephone Encounter (Signed)
Noted I have contacted [facility] to follow up in regards to lab results. I left a message on their provider line for staff to return my call. Currently Awaiting call back

## 2022-08-03 NOTE — Telephone Encounter (Signed)
I spoke with Vicki [name] with [facility] and she reported that the results are typically entered online and she is unsure if they are able to fax over printed results. Vicki [name] stated she would gather more information from her supervisor and fax over results if she is able but if not she would follow up with me in regards to this.

## 2022-08-03 NOTE — Telephone Encounter (Signed)
-----   Message from Eulas Post, MD sent at 07/17/2022  7:59 AM EST ----- This is the link I received.  Was unable to pull up any actual results.   We might have to contact company to see if they can send printed review of results.  ----- Message ----- From: Interface, [facility] Sent: 07/09/2022   3:46 PM EST To: Eulas Post, MD

## 2022-08-09 ENCOUNTER — Encounter: Payer: Self-pay | Admitting: Family Medicine

## 2022-08-09 ENCOUNTER — Telehealth: Payer: BC Managed Care – PPO | Admitting: Family Medicine

## 2022-08-09 DIAGNOSIS — B001 Herpesviral vesicular dermatitis: Secondary | ICD-10-CM | POA: Diagnosis not present

## 2022-08-09 MED ORDER — VALACYCLOVIR HCL 1 G PO TABS: 2000.0000 mg | ORAL_TABLET | Freq: Two times a day (BID) | ORAL | 0 refills | Status: AC

## 2022-08-09 NOTE — Progress Notes (Signed)

## 2022-08-10 NOTE — Telephone Encounter (Signed)
Noted  

## 2022-08-27 ENCOUNTER — Ambulatory Visit: Payer: BC Managed Care – PPO | Admitting: Podiatry

## 2022-08-28 DIAGNOSIS — Z1231 Encounter for screening mammogram for malignant neoplasm of breast: Secondary | ICD-10-CM | POA: Diagnosis not present

## 2022-08-28 LAB — HM MAMMOGRAPHY

## 2022-09-06 DIAGNOSIS — C44311 Basal cell carcinoma of skin of nose: Secondary | ICD-10-CM | POA: Diagnosis not present

## 2022-10-05 DIAGNOSIS — G43709 Chronic migraine without aura, not intractable, without status migrainosus: Secondary | ICD-10-CM | POA: Diagnosis not present

## 2022-10-18 ENCOUNTER — Encounter: Payer: Self-pay | Admitting: Podiatry

## 2022-10-18 ENCOUNTER — Ambulatory Visit (INDEPENDENT_AMBULATORY_CARE_PROVIDER_SITE_OTHER): Payer: BC Managed Care – PPO | Admitting: Podiatry

## 2022-10-18 DIAGNOSIS — M722 Plantar fascial fibromatosis: Secondary | ICD-10-CM

## 2022-10-18 DIAGNOSIS — G5792 Unspecified mononeuropathy of left lower limb: Secondary | ICD-10-CM

## 2022-10-18 MED ORDER — TRIAMCINOLONE ACETONIDE 40 MG/ML IJ SUSP
20.0000 mg | Freq: Once | INTRAMUSCULAR | Status: AC
  Administered 2022-10-18: 20 mg

## 2022-10-18 NOTE — Progress Notes (Signed)
She presents today for follow-up of her Planter fasciitis she saw Dr. Juanetta Gosling last visit and she states that is better for a few days but really did not seem to hit the spot.  She was unable to take the methylprednisolone or the meloxicam due to gastric upset.  Objective: Vital signs are stable she is alert and oriented x 3.  She has of severe pain on palpation of the plantar central calcaneal tubercle area with a palpable fluctuant area and radiating pain on palpation.  Assessment: Planter fasciitis with bursitis and Baxters neuritis.  Plan: I injected the area today with 10 mg Kenalog number grams Marcaine directly into the painful area.  She understood stands that if this fails to alleviate her symptoms MRI will be necessary.

## 2022-11-12 ENCOUNTER — Ambulatory Visit: Payer: BC Managed Care – PPO | Admitting: Sports Medicine

## 2022-11-12 ENCOUNTER — Other Ambulatory Visit: Payer: Self-pay | Admitting: Family Medicine

## 2022-11-22 ENCOUNTER — Ambulatory Visit: Payer: BC Managed Care – PPO | Admitting: Podiatry

## 2022-12-07 ENCOUNTER — Encounter: Payer: Self-pay | Admitting: Podiatry

## 2022-12-07 DIAGNOSIS — S93692A Other sprain of left foot, initial encounter: Secondary | ICD-10-CM

## 2022-12-12 ENCOUNTER — Telehealth: Payer: Self-pay | Admitting: Podiatrist

## 2022-12-12 NOTE — Telephone Encounter (Signed)
Vicki [name] called and left a voicemail regarding her upcoming MRI.  She wanted to clarify that the order was correct for the left ankle due to her plantar fasciitis diagnosis.   I confirmed that the left ankle MRI order is correct as this is the view Dr. Al Corpus prefers for this pathology.   I spoke with Vicki [name] directly and I also called [facility] to confirm the order is correct.  They will reach out to her to schedule tomorrow.

## 2022-12-13 ENCOUNTER — Encounter: Payer: Self-pay | Admitting: Podiatry

## 2022-12-14 ENCOUNTER — Ambulatory Visit
Admission: RE | Admit: 2022-12-14 | Discharge: 2022-12-14 | Disposition: A | Discharge status: Home / Self Care | Payer: BC Managed Care – PPO | Source: Ambulatory Visit | Attending: Podiatry | Admitting: Podiatry

## 2022-12-14 DIAGNOSIS — M25572 Pain in left ankle and joints of left foot: Secondary | ICD-10-CM | POA: Diagnosis not present

## 2022-12-14 DIAGNOSIS — S93692A Other sprain of left foot, initial encounter: Secondary | ICD-10-CM

## 2023-01-01 ENCOUNTER — Encounter: Payer: Self-pay | Admitting: Podiatry

## 2023-01-01 ENCOUNTER — Ambulatory Visit (INDEPENDENT_AMBULATORY_CARE_PROVIDER_SITE_OTHER): Payer: BC Managed Care – PPO | Admitting: Podiatry

## 2023-01-01 DIAGNOSIS — M722 Plantar fascial fibromatosis: Secondary | ICD-10-CM

## 2023-01-01 MED ORDER — TRIAMCINOLONE ACETONIDE 40 MG/ML IJ SUSP
20.0000 mg | Freq: Once | INTRAMUSCULAR | Status: AC
Start: 2023-01-01 — End: 2023-01-01
  Administered 2023-01-01: 20 mg

## 2023-01-01 NOTE — Progress Notes (Signed)
She presents today for follow-up of her MRI.  States that is still hurting just as bad as it was she refers to her left heel.  Objective: Vitals are stable oriented x 3.  MRI does not demonstrate a tear of the plantar fascia but it does demonstrate plantar fasciitis chronic with edema in the calcaneus.  Assessment: Chronic intractable plantar fasciitis.  Plan: Start her with [facility] and schedule her for orthotics also dispensed a night splint.  I was able to inject her left heel today with Kenalog and local anesthetic.

## 2023-01-02 ENCOUNTER — Ambulatory Visit (INDEPENDENT_AMBULATORY_CARE_PROVIDER_SITE_OTHER): Payer: BC Managed Care – PPO | Admitting: Physical Therapy

## 2023-01-02 ENCOUNTER — Encounter: Payer: Self-pay | Admitting: Physical Therapy

## 2023-01-02 DIAGNOSIS — R262 Difficulty in walking, not elsewhere classified: Secondary | ICD-10-CM | POA: Diagnosis not present

## 2023-01-02 DIAGNOSIS — M79672 Pain in left foot: Secondary | ICD-10-CM | POA: Diagnosis not present

## 2023-01-02 DIAGNOSIS — M6281 Muscle weakness (generalized): Secondary | ICD-10-CM | POA: Diagnosis not present

## 2023-01-02 DIAGNOSIS — G8929 Other chronic pain: Secondary | ICD-10-CM | POA: Diagnosis not present

## 2023-01-02 NOTE — Therapy (Signed)
OUTPATIENT PHYSICAL THERAPY LOWER EXTREMITY EVALUATION   Patient Name: Vicki [name] MRN: [id number] DOB:[birth year], 62 y.o., female Today's Date: 01/02/2023  END OF SESSION:  PT End of Session - 01/02/23 0933     Visit Number 1    Number of Visits 16    Date for PT Re-Evaluation 02/27/23    Authorization Type [employer]    PT Start Time 0934    PT Stop Time 1020    PT Time Calculation (min) 46 min             Past Medical History:  Diagnosis Date   Allergy    Anxiety    hx ?panic disorder   Asthma    Celiac disease    Chronic gastritis    GERD (gastroesophageal reflux disease)    Hyperplastic colon polyp    Migraines    chronic   Schatzki's ring    Skin cancer    chin area   Thyroid disease    Past Surgical History:  Procedure Laterality Date   ABLATION  2012   CESAREAN SECTION  1984   COLONOSCOPY  2011   DILATION AND CURETTAGE OF UTERUS  2012   POLYPECTOMY     TONSILLECTOMY AND ADENOIDECTOMY  1964   TUBAL LIGATION  1998   UPPER GASTROINTESTINAL ENDOSCOPY  2011,1998   Patient Active Problem List   Diagnosis Date Noted   Arthritis of hand 08/12/2021   Acquired trigger finger of left ring finger 08/10/2021   Dupuytren's disease of finger 08/10/2021   Chronic migraine without aura without status migrainosus, not intractable 02/19/2019   Irritable bowel disease 01/14/2018   Gastroesophageal reflux disease without esophagitis 11/09/2015   Belching 11/09/2015   Loose stools 11/09/2015   History of migraine headaches 01/09/2013   Asthma, mild intermittent 01/31/2012   GERD (gastroesophageal reflux disease) 01/31/2012   Celiac disease 01/31/2012   Panic disorder 01/31/2012    PCP: Kristian Covey, MD  REFERRING PROVIDER: Elinor Parkinson, DPM   REFERRING DIAG: M72.2 (ICD-10-CM) - Plantar fasciitis, left   THERAPY DIAG:  Chronic heel pain, left  Muscle weakness (generalized)  Difficulty in walking, not elsewhere classified  Rationale for Evaluation  and Treatment: Rehabilitation  ONSET DATE: Episodic heel pain over the years  SUBJECTIVE:   SUBJECTIVE STATEMENT: States she used to have heel pain in both feet but it got better. States in March of this year she cleaned the house in crocs and then she got an injection in her left heel. States she didn't do the general exercsies given to her. States she didn't take the pain meds as it upset her gut. States she always wears tennis shoes. States she is very active and she walks her dogs everyday. Gets 15-20K steps a day. Wants to continue to stay active.  Reports her pain returned in May and she got another injection.   States she has pain first thing in the morning and then it feels better in the mid day and then by the end of the day it starts to hurt again. Standing for long periods of time hurts. Reports she wears tennis shoes in the house and has orthotics. States it feels better with shoes on. States she doesn't want to have surgery.   PERTINENT HISTORY: Migraines PAIN:  Are you having pain? Yes: NPRS scale: 2, worst 9/10 Pain location: left heel Pain description: dull, achy, sharp, tender, tightness,  Aggravating factors: walking/standing, AM Relieving factors: walking, shoes, inserts, ice, injections, stretching  PRECAUTIONS: None  RED FLAGS: None   WEIGHT BEARING RESTRICTIONS: No  FALLS:  Has patient fallen in last 6 months? No    OCCUPATION: retired, IT sales professional   PLOF: Independent  PATIENT GOALS: to be able to have less pain and to be     OBJECTIVE:   DIAGNOSTIC FINDINGS:  MRI left ankle 12/14/22 IMPRESSION: 1. Mild tendinosis of the posterior tibial tendon. 2. Plantar fasciitis of the medial band of plantar fascia towards the calcaneal insertion with subcortical reactive marrow edema. No tear of the plantar fascia.   COGNITION: Overall cognitive status: Within functional limits for tasks assessed       EDEMA:  Circumferential: at ankle 18cm Left,  17.5 cm R     PALPATION: Tenderness to palpation along medal sides of lower legs, arches L>R, hypo mobilitiy in mid foot B    LE Measurements Lower Extremity Right EVAL Left EVAL   A/PROM MMT A/PROM MMT  Hip Flexion      Hip Extension      Hip Abduction      Hip Adduction      Hip Internal rotation 20  20   Hip External rotation 40  30   Knee Flexion      Knee Extension      Ankle Dorsiflexion 5 4+ 2 4+  Ankle Plantarflexion 50 Cannot perform SL heel raise 50 Cannot perform SL heel raise  Ankle Inversion 35 4+ 30 4+  Ankle Eversion 0 4-  4-   (Blank rows = not tested) * pain Ankles: Right rest in 10 deg eversion and left rest in 5 degrees    LOWER EXTREMITY SPECIAL TESTS:  Ely's test + B    GAIT: Distance walked: 25 ft in clinic Assistive device utilized: None Level of assistance: Complete Independence Comments: supinates posteriorly B   TODAY'S TREATMENT:                                                                                                                              DATE:   01/02/2023  Therapeutic Exercise:  Aerobic: Supine: Prone:  Seated: calf stretch x1 30" holds B strap  Standing: heel raises DL [id number] 5" holds, Neuromuscular Re-education: Manual Therapy: Therapeutic Activity: Self Care: Trigger Point Dry Needling:  Modalities:    PATIENT EDUCATION:  Education details: on current presentation, on HEP, on clinical outcomes score and POC, on benefits of compression garments, on different types of house shoes  Person educated: Patient Education method: Explanation, Demonstration, and Handouts Education comprehension: verbalized understanding   HOME EXERCISE PROGRAM: [id number]  ASSESSMENT:  CLINICAL IMPRESSION: Patient presents to PT with chronic episodic plantar fasciitis that has been worse this year and has not resolved with injections or rest. Patient presents with weakness, swelling, limited mobility and altered mechanics that are  likely contributing to current presentation. Educated patient on current findings and patient would greatly benefit from skilled PT to improve overall function and QOL.  OBJECTIVE IMPAIRMENTS: decreased activity tolerance, decreased balance, decreased knowledge of use of DME, decreased mobility, difficulty walking, decreased ROM, increased edema, and pain.   ACTIVITY LIMITATIONS: lifting, standing, transfers, and locomotion level  PARTICIPATION LIMITATIONS: cleaning, community activity, and yard work  PERSONAL FACTORS: Time since onset of injury/illness/exacerbation are also affecting patient's functional outcome.   REHAB POTENTIAL: Good  CLINICAL DECISION MAKING: Stable/uncomplicated  EVALUATION COMPLEXITY: Low   GOALS: Goals reviewed with patient? yes  SHORT TERM GOALS: Target date: 01/30/2023   Patient will be independent in self management strategies to improve quality of life and functional outcomes. Baseline: New Program Goal status: INITIAL  2.  Patient will report at least 50% improvement in overall symptoms and/or function to demonstrate improved functional mobility Baseline: 0% better Goal status: INITIAL  3.  Patient will demonstrate no difference in swelling between left and right lower legs  Baseline: current swelling in left  leg Goal status: INITIAL      LONG TERM GOALS: Target date: 02/27/2023   Patient will report at least 75% improvement in overall symptoms and/or function to demonstrate improved functional mobility Baseline: 0% better Goal status: INITIAL  2.   Patient will be able to perform all daily activities without pain in feet to improve tolerance to ADLS, Baseline:  painful Goal status: INITIAL  3.  Patient will be able to perform 20 single leg heel raises on either foot to demonstrate improved calf strength Baseline: unable Goal status: INITIAL  4.  Patient will report performing regular mobility routine to reduce stiffness in pain in  lower legs Baseline: not currently. Goal status: INITIAL     PLAN:  PT FREQUENCY: 2x/week  PT DURATION: 8 weeks  PLANNED INTERVENTIONS: Therapeutic exercises, Therapeutic activity, Neuromuscular re-education, Balance training, Gait training, Patient/Family education, Self Care, Joint mobilization, Joint manipulation, Stair training, Vestibular training, Canalith repositioning, Orthotic/Fit training, Prosthetic training, DME instructions, Aquatic Therapy, Dry Needling, Electrical stimulation, Spinal manipulation, Spinal mobilization, Cryotherapy, Moist heat, Taping, Traction, Ultrasound, Ionotophoresis 4mg /ml Dexamethasone, Manual therapy, and Re-evaluation.   PLAN FOR NEXT SESSION: check leg length, STM lower leg, hip mobility, calf strengthening, balance   11:24 AM, 01/02/23 Tereasa Coop, DPT Physical Therapy with [facility]

## 2023-01-07 ENCOUNTER — Encounter: Payer: Self-pay | Admitting: Physical Therapy

## 2023-01-07 ENCOUNTER — Ambulatory Visit (INDEPENDENT_AMBULATORY_CARE_PROVIDER_SITE_OTHER): Payer: BC Managed Care – PPO | Admitting: Physical Therapy

## 2023-01-07 ENCOUNTER — Other Ambulatory Visit: Payer: BC Managed Care – PPO

## 2023-01-07 DIAGNOSIS — M6281 Muscle weakness (generalized): Secondary | ICD-10-CM

## 2023-01-07 DIAGNOSIS — G8929 Other chronic pain: Secondary | ICD-10-CM

## 2023-01-07 DIAGNOSIS — M79672 Pain in left foot: Secondary | ICD-10-CM | POA: Diagnosis not present

## 2023-01-07 DIAGNOSIS — R262 Difficulty in walking, not elsewhere classified: Secondary | ICD-10-CM | POA: Diagnosis not present

## 2023-01-07 NOTE — Therapy (Signed)
OUTPATIENT PHYSICAL THERAPY LOWER EXTREMITY TREATMENT   Patient Name: Vicki [name] MRN: [id number] DOB:[birth year], 62 y.o., female Today's Date: 01/07/2023  END OF SESSION:  PT End of Session - 01/07/23 1301     Visit Number 2    Number of Visits 16    Date for PT Re-Evaluation 02/27/23    Authorization Type [employer]    PT Start Time 1303    PT Stop Time 1341    PT Time Calculation (min) 38 min             Past Medical History:  Diagnosis Date   Allergy    Anxiety    hx ?panic disorder   Asthma    Celiac disease    Chronic gastritis    GERD (gastroesophageal reflux disease)    Hyperplastic colon polyp    Migraines    chronic   Schatzki's ring    Skin cancer    chin area   Thyroid disease    Past Surgical History:  Procedure Laterality Date   ABLATION  2012   CESAREAN SECTION  1984   COLONOSCOPY  2011   DILATION AND CURETTAGE OF UTERUS  2012   POLYPECTOMY     TONSILLECTOMY AND ADENOIDECTOMY  1964   TUBAL LIGATION  1998   UPPER GASTROINTESTINAL ENDOSCOPY  2011,1998   Patient Active Problem List   Diagnosis Date Noted   Arthritis of hand 08/12/2021   Acquired trigger finger of left ring finger 08/10/2021   Dupuytren's disease of finger 08/10/2021   Chronic migraine without aura without status migrainosus, not intractable 02/19/2019   Irritable bowel disease 01/14/2018   Gastroesophageal reflux disease without esophagitis 11/09/2015   Belching 11/09/2015   Loose stools 11/09/2015   History of migraine headaches 01/09/2013   Asthma, mild intermittent 01/31/2012   GERD (gastroesophageal reflux disease) 01/31/2012   Celiac disease 01/31/2012   Panic disorder 01/31/2012    PCP: Kristian Covey, MD  REFERRING PROVIDER: Elinor Parkinson, DPM   REFERRING DIAG: M72.2 (ICD-10-CM) - Plantar fasciitis, left   THERAPY DIAG:  Chronic heel pain, left  Difficulty in walking, not elsewhere classified  Muscle weakness (generalized)  Rationale for Evaluation  and Treatment: Rehabilitation  ONSET DATE: Episodic heel pain over the years  SUBJECTIVE:   SUBJECTIVE STATEMENT: 01/07/2023 States that she ordered th compression socks. States that her side of her left foot has been sore. States that as the plantar fascia better.   Eval: States she used to have heel pain in both feet but it got better. States in March of this year she cleaned the house in crocs and then she got an injection in her left heel. States she didn't do the general exercsies given to her. States she didn't take the pain meds as it upset her gut. States she always wears tennis shoes. States she is very active and she walks her dogs everyday. Gets 15-20K steps a day. Wants to continue to stay active.  Reports her pain returned in May and she got another injection.   States she has pain first thing in the morning and then it feels better in the mid day and then by the end of the day it starts to hurt again. Standing for long periods of time hurts. Reports she wears tennis shoes in the house and has orthotics. States it feels better with shoes on. States she doesn't want to have surgery.   PERTINENT HISTORY: Migraines PAIN:  Are you having pain? Yes: NPRS  scale: 3/10 Pain location: left lateral foot  Pain description: dull, achy, sharp, tender, tightness,  Aggravating factors: walking/standing, AM Relieving factors: walking, shoes, inserts, ice, injections, stretching  PRECAUTIONS: None  RED FLAGS: None   WEIGHT BEARING RESTRICTIONS: No  FALLS:  Has patient fallen in last 6 months? No    OCCUPATION: retired, IT sales professional   PLOF: Independent  PATIENT GOALS: to be able to have less pain and to be     OBJECTIVE:   DIAGNOSTIC FINDINGS:  MRI left ankle 12/14/22 IMPRESSION: 1. Mild tendinosis of the posterior tibial tendon. 2. Plantar fasciitis of the medial band of plantar fascia towards the calcaneal insertion with subcortical reactive marrow edema. No tear of  the plantar fascia.   COGNITION: Overall cognitive status: Within functional limits for tasks assessed       EDEMA:  Circumferential: at ankle 18cm Left, 17.5 cm R     PALPATION: Tenderness to palpation along medal sides of lower legs, arches L>R, hypo mobilitiy in mid foot B    LE Measurements Lower Extremity Right EVAL Left EVAL   A/PROM MMT A/PROM MMT  Hip Flexion      Hip Extension      Hip Abduction      Hip Adduction      Hip Internal rotation 20  20   Hip External rotation 40  30   Knee Flexion      Knee Extension      Ankle Dorsiflexion 5 4+ 2 4+  Ankle Plantarflexion 50 Cannot perform SL heel raise 50 Cannot perform SL heel raise  Ankle Inversion 35 4+ 30 4+  Ankle Eversion 0 4-  4-   (Blank rows = not tested) * pain Ankles: Right rest in 10 deg eversion and left rest in 5 degrees    LOWER EXTREMITY SPECIAL TESTS:  Ely's test + B    GAIT: Distance walked: 25 ft in clinic Assistive device utilized: None Level of assistance: Complete Independence Comments: supinates posteriorly B   TODAY'S TREATMENT:                                                                                                                              DATE:   01/07/2023  Therapeutic Exercise:  Aerobic: Supine: Prone:  Seated: inversion/eversion with DF 5 minutes B, toe splaying/extension/flexion and abd 20 minutes, self mobilization to medial leg with tennis ball 5 minutes B  Standing: heel raises DL [id number] 5" holds with tennis ball Neuromuscular Re-education: Manual Therapy: Therapeutic Activity: Self Care: Trigger Point Dry Needling:  Modalities:    PATIENT EDUCATION:  Education details: on anatomy and HEP Person educated: Patient Education method: Explanation, Demonstration, and Handouts Education comprehension: verbalized understanding   HOME EXERCISE PROGRAM: [id number]  ASSESSMENT:  CLINICAL IMPRESSION: 01/07/2023 Session focused on foot and toe mobility.  Tolerated well no pain noted. Added ball to heel raise to help with inversion as patient liked to turn foot out causing  lateral foot pain. Overall tolerated exercises well and would continue to benefit from skilled PT at this time.   Patient presents to PT with chronic episodic plantar fasciitis that has been worse this year and has not resolved with injections or rest. Patient presents with weakness, swelling, limited mobility and altered mechanics that are likely contributing to current presentation. Educated patient on current findings and patient would greatly benefit from skilled PT to improve overall function and QOL.    OBJECTIVE IMPAIRMENTS: decreased activity tolerance, decreased balance, decreased knowledge of use of DME, decreased mobility, difficulty walking, decreased ROM, increased edema, and pain.   ACTIVITY LIMITATIONS: lifting, standing, transfers, and locomotion level  PARTICIPATION LIMITATIONS: cleaning, community activity, and yard work  PERSONAL FACTORS: Time since onset of injury/illness/exacerbation are also affecting patient's functional outcome.   REHAB POTENTIAL: Good  CLINICAL DECISION MAKING: Stable/uncomplicated  EVALUATION COMPLEXITY: Low   GOALS: Goals reviewed with patient? yes  SHORT TERM GOALS: Target date: 01/30/2023   Patient will be independent in self management strategies to improve quality of life and functional outcomes. Baseline: New Program Goal status: INITIAL  2.  Patient will report at least 50% improvement in overall symptoms and/or function to demonstrate improved functional mobility Baseline: 0% better Goal status: INITIAL  3.  Patient will demonstrate no difference in swelling between left and right lower legs  Baseline: current swelling in left  leg Goal status: INITIAL      LONG TERM GOALS: Target date: 02/27/2023   Patient will report at least 75% improvement in overall symptoms and/or function to demonstrate improved  functional mobility Baseline: 0% better Goal status: INITIAL  2.   Patient will be able to perform all daily activities without pain in feet to improve tolerance to ADLS, Baseline:  painful Goal status: INITIAL  3.  Patient will be able to perform 20 single leg heel raises on either foot to demonstrate improved calf strength Baseline: unable Goal status: INITIAL  4.  Patient will report performing regular mobility routine to reduce stiffness in pain in lower legs Baseline: not currently. Goal status: INITIAL     PLAN:  PT FREQUENCY: 2x/week  PT DURATION: 8 weeks  PLANNED INTERVENTIONS: Therapeutic exercises, Therapeutic activity, Neuromuscular re-education, Balance training, Gait training, Patient/Family education, Self Care, Joint mobilization, Joint manipulation, Stair training, Vestibular training, Canalith repositioning, Orthotic/Fit training, Prosthetic training, DME instructions, Aquatic Therapy, Dry Needling, Electrical stimulation, Spinal manipulation, Spinal mobilization, Cryotherapy, Moist heat, Taping, Traction, Ultrasound, Ionotophoresis 4mg /ml Dexamethasone, Manual therapy, and Re-evaluation.   PLAN FOR NEXT SESSION: check leg length, STM lower leg, hip mobility, calf strengthening, balance   1:50 PM, 01/07/23 Tereasa Coop, DPT Physical Therapy with [facility]

## 2023-01-09 ENCOUNTER — Encounter: Payer: Self-pay | Admitting: Physical Therapy

## 2023-01-09 ENCOUNTER — Ambulatory Visit (INDEPENDENT_AMBULATORY_CARE_PROVIDER_SITE_OTHER): Payer: BC Managed Care – PPO | Admitting: Physical Therapy

## 2023-01-09 DIAGNOSIS — M79672 Pain in left foot: Secondary | ICD-10-CM

## 2023-01-09 DIAGNOSIS — M6281 Muscle weakness (generalized): Secondary | ICD-10-CM

## 2023-01-09 DIAGNOSIS — G8929 Other chronic pain: Secondary | ICD-10-CM

## 2023-01-09 DIAGNOSIS — R262 Difficulty in walking, not elsewhere classified: Secondary | ICD-10-CM

## 2023-01-09 NOTE — Therapy (Signed)
OUTPATIENT PHYSICAL THERAPY LOWER EXTREMITY TREATMENT   Patient Name: Vicki [name] MRN: [id number] DOB:1960/12/17, 62 y.o., female Today's Date: 01/09/2023  END OF SESSION:  PT End of Session - 01/09/23 1431     Visit Number 3    Number of Visits 16    Date for PT Re-Evaluation 02/27/23    Authorization Type BCBS    PT Start Time 1431    PT Stop Time 1511    PT Time Calculation (min) 40 min             Past Medical History:  Diagnosis Date   Allergy    Anxiety    hx ?panic disorder   Asthma    Celiac disease    Chronic gastritis    GERD (gastroesophageal reflux disease)    Hyperplastic colon polyp    Migraines    chronic   Schatzki's ring    Skin cancer    chin area   Thyroid disease    Past Surgical History:  Procedure Laterality Date   ABLATION  2012   CESAREAN SECTION  1984   COLONOSCOPY  2011   DILATION AND CURETTAGE OF UTERUS  2012   POLYPECTOMY     TONSILLECTOMY AND ADENOIDECTOMY  1964   TUBAL LIGATION  1998   UPPER GASTROINTESTINAL ENDOSCOPY  2011,1998   Patient Active Problem List   Diagnosis Date Noted   Arthritis of hand 08/12/2021   Acquired trigger finger of left ring finger 08/10/2021   Dupuytren's disease of finger 08/10/2021   Chronic migraine without aura without status migrainosus, not intractable 02/19/2019   Irritable bowel disease 01/14/2018   Gastroesophageal reflux disease without esophagitis 11/09/2015   Belching 11/09/2015   Loose stools 11/09/2015   History of migraine headaches 01/09/2013   Asthma, mild intermittent 01/31/2012   GERD (gastroesophageal reflux disease) 01/31/2012   Celiac disease 01/31/2012   Panic disorder 01/31/2012    PCP: Kristian Covey, MD  REFERRING PROVIDER: Elinor Parkinson, DPM   REFERRING DIAG: M72.2 (ICD-10-CM) - Plantar fasciitis, left   THERAPY DIAG:  Chronic heel pain, left  Difficulty in walking, not elsewhere classified  Muscle weakness (generalized)  Rationale for Evaluation  and Treatment: Rehabilitation  ONSET DATE: Episodic heel pain over the years  SUBJECTIVE:   SUBJECTIVE STATEMENT: 01/09/2023 States her plantar fascia and heel are not really bothering her. States it is now it is mostly the top and side of her foot.   Eval: States she used to have heel pain in both feet but it got better. States in March of this year she cleaned the house in crocs and then she got an injection in her left heel. States she didn't do the general exercsies given to her. States she didn't take the pain meds as it upset her gut. States she always wears tennis shoes. States she is very active and she walks her dogs everyday. Gets 15-20K steps a day. Wants to continue to stay active.  Reports her pain returned in May and she got another injection.   States she has pain first thing in the morning and then it feels better in the mid day and then by the end of the day it starts to hurt again. Standing for long periods of time hurts. Reports she wears tennis shoes in the house and has orthotics. States it feels better with shoes on. States she doesn't want to have surgery.   PERTINENT HISTORY: Migraines PAIN:  Are you having pain? Yes: NPRS  scale: 3/10 Pain location: left lateral foot  Pain description: ache  Aggravating factors: walking/standing, AM Relieving factors: walking, shoes, inserts, ice, injections, stretching  PRECAUTIONS: None  RED FLAGS: None   WEIGHT BEARING RESTRICTIONS: No  FALLS:  Has patient fallen in last 6 months? No    OCCUPATION: retired, IT sales professional   PLOF: Independent  PATIENT GOALS: to be able to have less pain and to be     OBJECTIVE:   DIAGNOSTIC FINDINGS:  MRI left ankle 12/14/22 IMPRESSION: 1. Mild tendinosis of the posterior tibial tendon. 2. Plantar fasciitis of the medial band of plantar fascia towards the calcaneal insertion with subcortical reactive marrow edema. No tear of the plantar fascia.   COGNITION: Overall  cognitive status: Within functional limits for tasks assessed       EDEMA:  Circumferential: at ankle 18cm Left, 17.5 cm R     PALPATION: Tenderness to palpation along medal sides of lower legs, arches L>R, hypo mobilitiy in mid foot B    LE Measurements Lower Extremity Right EVAL Left EVAL   A/PROM MMT A/PROM MMT  Hip Flexion      Hip Extension      Hip Abduction      Hip Adduction      Hip Internal rotation 20  20   Hip External rotation 40  30   Knee Flexion      Knee Extension      Ankle Dorsiflexion 5 4+ 2 4+  Ankle Plantarflexion 50 Cannot perform SL heel raise 50 Cannot perform SL heel raise  Ankle Inversion 35 4+ 30 4+  Ankle Eversion 0 4-  4-   (Blank rows = not tested) * pain Ankles: Right rest in 10 deg eversion and left rest in 5 degrees    LOWER EXTREMITY SPECIAL TESTS:  Ely's test + B    GAIT: Distance walked: 25 ft in clinic Assistive device utilized: None Level of assistance: Complete Independence Comments: supinates posteriorly B   TODAY'S TREATMENT:                                                                                                                              DATE:   01/09/2023  Therapeutic Exercise:  Aerobic: Supine: Prone:  Seated: arch lifts left 5 minutes, toe mobility B flex/ext/splaying, walking with reduced ankle eversion 5 minutes  Standing:  Kneeling: tibial translation mobilization 10 minutes B Neuromuscular Re-education: Manual Therapy: percussion gun to towel for vibration to left foot - tolerated well 12 minutes Therapeutic Activity: Self Care: Trigger Point Dry Needling:  Modalities:    PATIENT EDUCATION:  Education details: on anatomy and HEP, on rationale behind interventions and safe use of percussion gun Person educated: Patient Education method: Explanation, Demonstration, and Handouts Education comprehension: verbalized understanding   HOME EXERCISE PROGRAM: [id number]  ASSESSMENT:  CLINICAL  IMPRESSION: 01/09/2023 Focused on gait and compensatory strategies in ankle and foot (excessive supination with walking/ eversion). Tolerated percussion  gun with vibration therapy well reporting reduced pain afterwards. Overall tolerated session well and will continue with curretn POC as tolerated.   Patient presents to PT with chronic episodic plantar fasciitis that has been worse this year and has not resolved with injections or rest. Patient presents with weakness, swelling, limited mobility and altered mechanics that are likely contributing to current presentation. Educated patient on current findings and patient would greatly benefit from skilled PT to improve overall function and QOL.    OBJECTIVE IMPAIRMENTS: decreased activity tolerance, decreased balance, decreased knowledge of use of DME, decreased mobility, difficulty walking, decreased ROM, increased edema, and pain.   ACTIVITY LIMITATIONS: lifting, standing, transfers, and locomotion level  PARTICIPATION LIMITATIONS: cleaning, community activity, and yard work  PERSONAL FACTORS: Time since onset of injury/illness/exacerbation are also affecting patient's functional outcome.   REHAB POTENTIAL: Good  CLINICAL DECISION MAKING: Stable/uncomplicated  EVALUATION COMPLEXITY: Low   GOALS: Goals reviewed with patient? yes  SHORT TERM GOALS: Target date: 01/30/2023   Patient will be independent in self management strategies to improve quality of life and functional outcomes. Baseline: New Program Goal status: INITIAL  2.  Patient will report at least 50% improvement in overall symptoms and/or function to demonstrate improved functional mobility Baseline: 0% better Goal status: INITIAL  3.  Patient will demonstrate no difference in swelling between left and right lower legs  Baseline: current swelling in left  leg Goal status: INITIAL      LONG TERM GOALS: Target date: 02/27/2023   Patient will report at least 75%  improvement in overall symptoms and/or function to demonstrate improved functional mobility Baseline: 0% better Goal status: INITIAL  2.   Patient will be able to perform all daily activities without pain in feet to improve tolerance to ADLS, Baseline:  painful Goal status: INITIAL  3.  Patient will be able to perform 20 single leg heel raises on either foot to demonstrate improved calf strength Baseline: unable Goal status: INITIAL  4.  Patient will report performing regular mobility routine to reduce stiffness in pain in lower legs Baseline: not currently. Goal status: INITIAL     PLAN:  PT FREQUENCY: 2x/week  PT DURATION: 8 weeks  PLANNED INTERVENTIONS: Therapeutic exercises, Therapeutic activity, Neuromuscular re-education, Balance training, Gait training, Patient/Family education, Self Care, Joint mobilization, Joint manipulation, Stair training, Vestibular training, Canalith repositioning, Orthotic/Fit training, Prosthetic training, DME instructions, Aquatic Therapy, Dry Needling, Electrical stimulation, Spinal manipulation, Spinal mobilization, Cryotherapy, Moist heat, Taping, Traction, Ultrasound, Ionotophoresis 4mg /ml Dexamethasone, Manual therapy, and Re-evaluation.   PLAN FOR NEXT SESSION: check leg length, STM lower leg, hip mobility, calf strengthening, balance   3:40 PM, 01/09/23 Tereasa Coop, DPT Physical Therapy with [facility]

## 2023-01-14 ENCOUNTER — Ambulatory Visit (INDEPENDENT_AMBULATORY_CARE_PROVIDER_SITE_OTHER): Payer: BC Managed Care – PPO | Admitting: Physical Therapy

## 2023-01-14 ENCOUNTER — Encounter: Payer: Self-pay | Admitting: Physical Therapy

## 2023-01-14 DIAGNOSIS — M79672 Pain in left foot: Secondary | ICD-10-CM | POA: Diagnosis not present

## 2023-01-14 DIAGNOSIS — R262 Difficulty in walking, not elsewhere classified: Secondary | ICD-10-CM | POA: Diagnosis not present

## 2023-01-14 DIAGNOSIS — G8929 Other chronic pain: Secondary | ICD-10-CM | POA: Diagnosis not present

## 2023-01-14 DIAGNOSIS — M6281 Muscle weakness (generalized): Secondary | ICD-10-CM

## 2023-01-14 NOTE — Therapy (Signed)
OUTPATIENT PHYSICAL THERAPY LOWER EXTREMITY TREATMENT   Patient Name: Vicki [name] MRN: [id number] DOB:1961-05-01, 62 y.o., female Today's Date: 01/14/2023  END OF SESSION:  PT End of Session - 01/14/23 1516     Visit Number 4    Number of Visits 16    Date for PT Re-Evaluation 02/27/23    Authorization Type BCBS    PT Start Time 1517    PT Stop Time 1555    PT Time Calculation (min) 38 min             Past Medical History:  Diagnosis Date   Allergy    Anxiety    hx ?panic disorder   Asthma    Celiac disease    Chronic gastritis    GERD (gastroesophageal reflux disease)    Hyperplastic colon polyp    Migraines    chronic   Schatzki's ring    Skin cancer    chin area   Thyroid disease    Past Surgical History:  Procedure Laterality Date   ABLATION  2012   CESAREAN SECTION  1984   COLONOSCOPY  2011   DILATION AND CURETTAGE OF UTERUS  2012   POLYPECTOMY     TONSILLECTOMY AND ADENOIDECTOMY  1964   TUBAL LIGATION  1998   UPPER GASTROINTESTINAL ENDOSCOPY  2011,1998   Patient Active Problem List   Diagnosis Date Noted   Arthritis of hand 08/12/2021   Acquired trigger finger of left ring finger 08/10/2021   Dupuytren's disease of finger 08/10/2021   Chronic migraine without aura without status migrainosus, not intractable 02/19/2019   Irritable bowel disease 01/14/2018   Gastroesophageal reflux disease without esophagitis 11/09/2015   Belching 11/09/2015   Loose stools 11/09/2015   History of migraine headaches 01/09/2013   Asthma, mild intermittent 01/31/2012   GERD (gastroesophageal reflux disease) 01/31/2012   Celiac disease 01/31/2012   Panic disorder 01/31/2012    PCP: Kristian Covey, MD  REFERRING PROVIDER: Elinor Parkinson, DPM   REFERRING DIAG: M72.2 (ICD-10-CM) - Plantar fasciitis, left   THERAPY DIAG:  Chronic heel pain, left  Difficulty in walking, not elsewhere classified  Muscle weakness (generalized)  Rationale for Evaluation  and Treatment: Rehabilitation  ONSET DATE: Episodic heel pain over the years  SUBJECTIVE:   SUBJECTIVE STATEMENT: 01/14/2023 States her plantar fascia and heel are not really bothering her. States it is now it is mostly the top and side of her foot.   Eval: States she used to have heel pain in both feet but it got better. States in March of this year she cleaned the house in crocs and then she got an injection in her left heel. States she didn't do the general exercsies given to her. States she didn't take the pain meds as it upset her gut. States she always wears tennis shoes. States she is very active and she walks her dogs everyday. Gets 15-20K steps a day. Wants to continue to stay active.  Reports her pain returned in May and she got another injection.   States she has pain first thing in the morning and then it feels better in the mid day and then by the end of the day it starts to hurt again. Standing for long periods of time hurts. Reports she wears tennis shoes in the house and has orthotics. States it feels better with shoes on. States she doesn't want to have surgery.   PERTINENT HISTORY: Migraines PAIN:  Are you having pain? Yes: NPRS  scale: 3/10 Pain location: left lateral foot  Pain description: ache  Aggravating factors: walking/standing, AM Relieving factors: walking, shoes, inserts, ice, injections, stretching  PRECAUTIONS: None  RED FLAGS: None   WEIGHT BEARING RESTRICTIONS: No  FALLS:  Has patient fallen in last 6 months? No    OCCUPATION: retired, IT sales professional   PLOF: Independent  PATIENT GOALS: to be able to have less pain and to be     OBJECTIVE:   DIAGNOSTIC FINDINGS:  MRI left ankle 12/14/22 IMPRESSION: 1. Mild tendinosis of the posterior tibial tendon. 2. Plantar fasciitis of the medial band of plantar fascia towards the calcaneal insertion with subcortical reactive marrow edema. No tear of the plantar fascia.   COGNITION: Overall  cognitive status: Within functional limits for tasks assessed       EDEMA:  Circumferential: at ankle 18cm Left, 17.5 cm R     PALPATION: Tenderness to palpation along medal sides of lower legs, arches L>R, hypo mobilitiy in mid foot B    LE Measurements Lower Extremity Right EVAL Left EVAL   A/PROM MMT A/PROM MMT  Hip Flexion      Hip Extension      Hip Abduction      Hip Adduction      Hip Internal rotation 20  20   Hip External rotation 40  30   Knee Flexion      Knee Extension      Ankle Dorsiflexion 5 4+ 2 4+  Ankle Plantarflexion 50 Cannot perform SL heel raise 50 Cannot perform SL heel raise  Ankle Inversion 35 4+ 30 4+  Ankle Eversion 0 4-  4-   (Blank rows = not tested) * pain Ankles: Right rest in 10 deg eversion and left rest in 5 degrees    LOWER EXTREMITY SPECIAL TESTS:  Ely's test + B    GAIT: Distance walked: 25 ft in clinic Assistive device utilized: None Level of assistance: Complete Independence Comments: supinates posteriorly B   TODAY'S TREATMENT:                                                                                                                              DATE:   01/14/2023  Therapeutic Exercise:  Aerobic: Supine: Prone:  Seated: arch lifts left 5 minutes, toe mobility B flex/ext/splaying, walking with reduced ankle eversion 5 minutes  Standing: eversion on towel roll 3 minutes, tandem on foam x4 30" holds B - focus on not supinating  Kneeling:  Neuromuscular Re-education: Manual Therapy:  Therapeutic Activity: Self Care: Trigger Point Dry Needling:  Modalities:    PATIENT EDUCATION:  Education details: on anatomy and HEP, on compression sleeve vs compression socks Person educated: Patient Education method: Explanation, Demonstration, and Handouts Education comprehension: verbalized understanding   HOME EXERCISE PROGRAM: [id number]  ASSESSMENT:  CLINICAL IMPRESSION: 01/14/2023 Continued to focus on foot  mobility and control. Focused and cued patient to press through medial side of foot as she  tends to supinate. Discussed discontinuing compression socks as she is getting pressure spots on her feet. Discussed using compression sleeves as her legs do feel better with the compression. Will continue with current POC as tolerated.   Patient presents to PT with chronic episodic plantar fasciitis that has been worse this year and has not resolved with injections or rest. Patient presents with weakness, swelling, limited mobility and altered mechanics that are likely contributing to current presentation. Educated patient on current findings and patient would greatly benefit from skilled PT to improve overall function and QOL.    OBJECTIVE IMPAIRMENTS: decreased activity tolerance, decreased balance, decreased knowledge of use of DME, decreased mobility, difficulty walking, decreased ROM, increased edema, and pain.   ACTIVITY LIMITATIONS: lifting, standing, transfers, and locomotion level  PARTICIPATION LIMITATIONS: cleaning, community activity, and yard work  PERSONAL FACTORS: Time since onset of injury/illness/exacerbation are also affecting patient's functional outcome.   REHAB POTENTIAL: Good  CLINICAL DECISION MAKING: Stable/uncomplicated  EVALUATION COMPLEXITY: Low   GOALS: Goals reviewed with patient? yes  SHORT TERM GOALS: Target date: 01/30/2023   Patient will be independent in self management strategies to improve quality of life and functional outcomes. Baseline: New Program Goal status: INITIAL  2.  Patient will report at least 50% improvement in overall symptoms and/or function to demonstrate improved functional mobility Baseline: 0% better Goal status: INITIAL  3.  Patient will demonstrate no difference in swelling between left and right lower legs  Baseline: current swelling in left  leg Goal status: INITIAL      LONG TERM GOALS: Target date: 02/27/2023   Patient will  report at least 75% improvement in overall symptoms and/or function to demonstrate improved functional mobility Baseline: 0% better Goal status: INITIAL  2.   Patient will be able to perform all daily activities without pain in feet to improve tolerance to ADLS, Baseline:  painful Goal status: INITIAL  3.  Patient will be able to perform 20 single leg heel raises on either foot to demonstrate improved calf strength Baseline: unable Goal status: INITIAL  4.  Patient will report performing regular mobility routine to reduce stiffness in pain in lower legs Baseline: not currently. Goal status: INITIAL     PLAN:  PT FREQUENCY: 2x/week  PT DURATION: 8 weeks  PLANNED INTERVENTIONS: Therapeutic exercises, Therapeutic activity, Neuromuscular re-education, Balance training, Gait training, Patient/Family education, Self Care, Joint mobilization, Joint manipulation, Stair training, Vestibular training, Canalith repositioning, Orthotic/Fit training, Prosthetic training, DME instructions, Aquatic Therapy, Dry Needling, Electrical stimulation, Spinal manipulation, Spinal mobilization, Cryotherapy, Moist heat, Taping, Traction, Ultrasound, Ionotophoresis 4mg /ml Dexamethasone, Manual therapy, and Re-evaluation.   PLAN FOR NEXT SESSION: check leg length, STM lower leg, hip mobility, calf strengthening, balance   4:02 PM, 01/14/23 Tereasa Coop, DPT Physical Therapy with [facility]

## 2023-01-15 ENCOUNTER — Encounter: Payer: Self-pay | Admitting: Family Medicine

## 2023-01-15 ENCOUNTER — Ambulatory Visit (INDEPENDENT_AMBULATORY_CARE_PROVIDER_SITE_OTHER): Payer: BC Managed Care – PPO | Admitting: Family Medicine

## 2023-01-15 VITALS — BP 108/60 | HR 75 | Temp 98.4°F | Ht 65.0 in | Wt 129.5 lb

## 2023-01-15 DIAGNOSIS — R5383 Other fatigue: Secondary | ICD-10-CM

## 2023-01-15 LAB — COMPREHENSIVE METABOLIC PANEL
ALT: 19 U/L (ref 0–35)
AST: 24 U/L (ref 0–37)
Albumin: 4.3 g/dL (ref 3.5–5.2)
Alkaline Phosphatase: 47 U/L (ref 39–117)
BUN: 10 mg/dL (ref 6–23)
CO2: 30 mEq/L (ref 19–32)
Calcium: 9.6 mg/dL (ref 8.4–10.5)
Chloride: 104 mEq/L (ref 96–112)
Creatinine, Ser: 0.78 mg/dL (ref 0.40–1.20)
GFR: 80.79 mL/min (ref >60.00)
Glucose, Bld: 93 mg/dL (ref 70–99)
Potassium: 4.1 mEq/L (ref 3.5–5.1)
Sodium: 141 mEq/L (ref 135–145)
Total Bilirubin: 0.5 mg/dL (ref 0.2–1.2)
Total Protein: 6.7 g/dL (ref 6.0–8.3)

## 2023-01-15 LAB — CBC WITH DIFFERENTIAL/PLATELET
Basophils Absolute: 0.1 10*3/uL (ref 0.0–0.1)
Basophils Relative: 0.9 % (ref 0.0–3.0)
Eosinophils Absolute: 0.2 10*3/uL (ref 0.0–0.7)
Eosinophils Relative: 2.7 % (ref 0.0–5.0)
HCT: 36.5 % (ref 36.0–46.0)
Hemoglobin: 12 g/dL (ref 12.0–15.0)
Lymphocytes Relative: 27.3 % (ref 12.0–46.0)
Lymphs Abs: 1.5 10*3/uL (ref 0.7–4.0)
MCHC: 32.9 g/dL (ref 30.0–36.0)
MCV: 95.8 fl (ref 78.0–100.0)
Monocytes Absolute: 0.5 10*3/uL (ref 0.1–1.0)
Monocytes Relative: 8.2 % (ref 3.0–12.0)
Neutro Abs: 3.4 10*3/uL (ref 1.4–7.7)
Neutrophils Relative %: 60.9 % (ref 43.0–77.0)
Platelets: 211 10*3/uL (ref 150.0–400.0)
RBC: 3.81 Mil/uL — ABNORMAL LOW (ref 3.87–5.11)
RDW: 13.8 % (ref 11.5–15.5)
WBC: 5.5 10*3/uL (ref 4.0–10.5)

## 2023-01-15 LAB — TSH: TSH: 2.81 u[IU]/mL (ref 0.35–5.50)

## 2023-01-15 LAB — VITAMIN B12: Vitamin B-12: 969 pg/mL — ABNORMAL HIGH (ref 211–911)

## 2023-01-15 NOTE — Progress Notes (Signed)
Established Patient Office Visit  Subjective   Patient ID: Vicki [name], female    DOB: 1961/01/09  Age: 62 y.o. MRN: [id number]  Chief Complaint  Patient presents with   Fatigue    Patient complains of fatigue, x8 weeks    HPI   Vicki [name] is seen with at least 8 weeks history of general fatigue.  No other specific symptoms.  She does recall pulling a tick off in June but had no rash.  July 22 she had onset of a fever which was low-grade for about 48 hours and she recalls having some mild headache and bodyaches.  Home COVID testing negative x 2.  No fever since then.  No arthralgias.  Sleep okay.  Appetite and weight stable.  No abdominal pain.  No change in bowel habits.  No dysuria.  No arthralgias.  Still walks about 12-14,000 steps per day but has been a little more fatigued with walking.  No chest pains.  No dyspnea.  Denies any night sweats.  Has not noted any adenopathy.  Past Medical History:  Diagnosis Date   Allergy    Anxiety    hx ?panic disorder   Asthma    Celiac disease    Chronic gastritis    GERD (gastroesophageal reflux disease)    Hyperplastic colon polyp    Migraines    chronic   Schatzki's ring    Skin cancer    chin area   Thyroid disease    Past Surgical History:  Procedure Laterality Date   ABLATION  2012   CESAREAN SECTION  1984   COLONOSCOPY  2011   DILATION AND CURETTAGE OF UTERUS  2012   POLYPECTOMY     TONSILLECTOMY AND ADENOIDECTOMY  1964   TUBAL LIGATION  1998   UPPER GASTROINTESTINAL ENDOSCOPY  2011,1998    reports that she has never smoked. She has never used smokeless tobacco. She reports that she does not drink alcohol and does not use drugs. family history includes Alcohol abuse in her father and mother; Arthritis in her maternal grandmother; Asthma in her paternal aunt; Breast cancer in her mother; Breast cancer (age of onset: 32) in her sister; Colon cancer in her paternal aunt and paternal grandfather; Irritable bowel syndrome in  her sister; Lupus in her sister; Stroke in her father. Allergies  Allergen Reactions   Lamisil [Terbinafine] Hives   Wheat     Other reaction(s): Respiratory Distress   Ceclor [Cefaclor]     hives   Cephalosporins    Latex    Zoster Vaccine Live Rash    Review of Systems  Constitutional:  Positive for malaise/fatigue. Negative for chills, fever and weight loss.  Respiratory:  Negative for cough and shortness of breath.   Cardiovascular:  Negative for chest pain and leg swelling.  Gastrointestinal:  Negative for abdominal pain, blood in stool and diarrhea.  Genitourinary:  Negative for dysuria.  Musculoskeletal:  Negative for myalgias.  Skin:  Negative for rash.  Neurological:  Negative for dizziness and headaches.  Psychiatric/Behavioral:  Negative for depression. The patient does not have insomnia.       Objective:     BP 108/60 (BP Location: Left Arm, Patient Position: Sitting, Cuff Size: Normal)   Pulse 75   Temp 98.4 F (36.9 C) (Oral)   Ht 5\' 5"  (1.651 m)   Wt 129 lb 8 oz (58.7 kg)   SpO2 99%   BMI 21.55 kg/m  BP Readings from Last 3 Encounters:  01/15/23 108/60  05/22/22 120/60  01/08/22 96/60   Wt Readings from Last 3 Encounters:  01/15/23 129 lb 8 oz (58.7 kg)  05/22/22 127 lb 8 oz (57.8 kg)  01/08/22 126 lb 9.6 oz (57.4 kg)      Physical Exam Vitals reviewed.  Constitutional:      General: She is not in acute distress.    Appearance: Normal appearance. She is not ill-appearing.  HENT:     Head: Normocephalic and atraumatic.     Mouth/Throat:     Mouth: Mucous membranes are moist.     Pharynx: Oropharynx is clear.  Cardiovascular:     Rate and Rhythm: Normal rate and regular rhythm.     Heart sounds:     No gallop.  Pulmonary:     Effort: Pulmonary effort is normal.     Breath sounds: Normal breath sounds. No wheezing.  Abdominal:     Palpations: Abdomen is soft. There is no mass.     Tenderness: There is no abdominal tenderness. There is  no guarding.  Musculoskeletal:     Cervical back: Neck supple.     Right lower leg: No edema.     Left lower leg: No edema.  Lymphadenopathy:     Cervical: No cervical adenopathy.  Skin:    Coloration: Skin is not jaundiced.     Findings: No rash.  Neurological:     Mental Status: She is alert.  Psychiatric:        Mood and Affect: Mood normal.        Thought Content: Thought content normal.      No results found for any visits on 01/15/23.    The 10-year ASCVD risk score (Arnett DK, et al., 2019) is: 2.7%    Assessment & Plan:   Problem List Items Addressed This Visit   None Visit Diagnoses     Fatigue, unspecified type    -  Primary   Relevant Orders   CBC with Differential/Platelet   CMP   TSH   Vitamin B12   B. burgdorfi Antibody     Patient presents with 40-month history of nonspecific fatigue.  Does recall tick bite back in June and 48 hours of low-grade fever back in late July but doubt these are connected.  She did not have any other specific symptoms such as rash, prolonged headache, arthralgias, etc. no history of sleep disturbance or depression.  No new medications.  Etiology unclear.  -Start with labs as above. -Watch for any new signs or symptoms. -Continue regular exercise as tolerated -Consider adding more resistance training into her current regimen of walking  No follow-ups on file.    Vicki Peat, MD

## 2023-01-16 ENCOUNTER — Encounter: Payer: Self-pay | Admitting: Physical Therapy

## 2023-01-16 ENCOUNTER — Ambulatory Visit (INDEPENDENT_AMBULATORY_CARE_PROVIDER_SITE_OTHER): Payer: BC Managed Care – PPO | Admitting: Physical Therapy

## 2023-01-16 DIAGNOSIS — R262 Difficulty in walking, not elsewhere classified: Secondary | ICD-10-CM

## 2023-01-16 DIAGNOSIS — G8929 Other chronic pain: Secondary | ICD-10-CM | POA: Diagnosis not present

## 2023-01-16 DIAGNOSIS — M6281 Muscle weakness (generalized): Secondary | ICD-10-CM | POA: Diagnosis not present

## 2023-01-16 DIAGNOSIS — M79672 Pain in left foot: Secondary | ICD-10-CM

## 2023-01-16 LAB — B. BURGDORFI ANTIBODIES: B burgdorferi Ab IgG+IgM: <0.9 {index}

## 2023-01-16 NOTE — Therapy (Signed)
OUTPATIENT PHYSICAL THERAPY LOWER EXTREMITY TREATMENT   Patient Name: Vicki [name] MRN: [id number] DOB:[birth year], 62 y.o., female Today's Date: 01/16/2023  END OF SESSION:  PT End of Session - 01/16/23 1430     Visit Number 5    Number of Visits 16    Date for PT Re-Evaluation 02/27/23    Authorization Type BCBS    Authorization - Visit Number 5    Authorization - Number of Visits 30    PT Start Time 1432    PT Stop Time 1511    PT Time Calculation (min) 39 min             Past Medical History:  Diagnosis Date   Allergy    Anxiety    hx ?panic disorder   Asthma    Celiac disease    Chronic gastritis    GERD (gastroesophageal reflux disease)    Hyperplastic colon polyp    Migraines    chronic   Schatzki's ring    Skin cancer    chin area   Thyroid disease    Past Surgical History:  Procedure Laterality Date   ABLATION  2012   CESAREAN SECTION  1984   COLONOSCOPY  2011   DILATION AND CURETTAGE OF UTERUS  2012   POLYPECTOMY     TONSILLECTOMY AND ADENOIDECTOMY  1964   TUBAL LIGATION  1998   UPPER GASTROINTESTINAL ENDOSCOPY  2011,1998   Patient Active Problem List   Diagnosis Date Noted   Arthritis of hand 08/12/2021   Acquired trigger finger of left ring finger 08/10/2021   Dupuytren's disease of finger 08/10/2021   Chronic migraine without aura without status migrainosus, not intractable 02/19/2019   Irritable bowel disease 01/14/2018   Gastroesophageal reflux disease without esophagitis 11/09/2015   Belching 11/09/2015   Loose stools 11/09/2015   History of migraine headaches 01/09/2013   Asthma, mild intermittent 01/31/2012   GERD (gastroesophageal reflux disease) 01/31/2012   Celiac disease 01/31/2012   Panic disorder 01/31/2012    PCP: Kristian Covey, MD  REFERRING PROVIDER: Elinor Parkinson, DPM   REFERRING DIAG: M72.2 (ICD-10-CM) - Plantar fasciitis, left   THERAPY DIAG:  Difficulty in walking, not elsewhere classified  Chronic  heel pain, left  Muscle weakness (generalized)  Rationale for Evaluation and Treatment: Rehabilitation  ONSET DATE: Episodic heel pain over the years  SUBJECTIVE:   SUBJECTIVE STATEMENT: 01/16/2023 States her pain is less but still having some band like symptoms around her   Eval: States she used to have heel pain in both feet but it got better. States in March of this year she cleaned the house in crocs and then she got an injection in her left heel. States she didn't do the general exercsies given to her. States she didn't take the pain meds as it upset her gut. States she always wears tennis shoes. States she is very active and she walks her dogs everyday. Gets 15-20K steps a day. Wants to continue to stay active.  Reports her pain returned in May and she got another injection.   States she has pain first thing in the morning and then it feels better in the mid day and then by the end of the day it starts to hurt again. Standing for long periods of time hurts. Reports she wears tennis shoes in the house and has orthotics. States it feels better with shoes on. States she doesn't want to have surgery.   PERTINENT HISTORY: Migraines PAIN:  Are you having pain? Yes: NPRS scale: 3/10 Pain location: left lateral foot  Pain description: ache  Aggravating factors: walking/standing, AM Relieving factors: walking, shoes, inserts, ice, injections, stretching  PRECAUTIONS: None  RED FLAGS: None   WEIGHT BEARING RESTRICTIONS: No  FALLS:  Has patient fallen in last 6 months? No    OCCUPATION: retired, IT sales professional   PLOF: Independent  PATIENT GOALS: to be able to have less pain and to be     OBJECTIVE:   DIAGNOSTIC FINDINGS:  MRI left ankle 12/14/22 IMPRESSION: 1. Mild tendinosis of the posterior tibial tendon. 2. Plantar fasciitis of the medial band of plantar fascia towards the calcaneal insertion with subcortical reactive marrow edema. No tear of the plantar  fascia.   COGNITION: Overall cognitive status: Within functional limits for tasks assessed       EDEMA:  Circumferential: at ankle 18cm Left, 17.5 cm R     PALPATION: Tenderness to palpation along medal sides of lower legs, arches L>R, hypo mobilitiy in mid foot B    LE Measurements Lower Extremity Right EVAL Left EVAL   A/PROM MMT A/PROM MMT  Hip Flexion      Hip Extension      Hip Abduction      Hip Adduction      Hip Internal rotation 20  20   Hip External rotation 40  30   Knee Flexion      Knee Extension      Ankle Dorsiflexion 5 4+ 2 4+  Ankle Plantarflexion 50 Cannot perform SL heel raise 50 Cannot perform SL heel raise  Ankle Inversion 35 4+ 30 4+  Ankle Eversion 0 4-  4-   (Blank rows = not tested) * pain Ankles: Right rest in 10 deg eversion and left rest in 5 degrees    LOWER EXTREMITY SPECIAL TESTS:  Ely's test + B    GAIT: Distance walked: 25 ft in clinic Assistive device utilized: None Level of assistance: Complete Independence Comments: supinates posteriorly B   TODAY'S TREATMENT:                                                                                                                              DATE:   01/16/2023  Therapeutic Exercise:  Aerobic: Supine: Prone:  Seated:  toe mobility B flex/ext/splaying, foot and ankle ROM in all directions 10 minutes  Standing:   Kneeling: tibial  stretch 5 minutes B Neuromuscular Re-education:tandem on foam with head turns 5 minutes, tandem on foam static x3 30"holds B Manual Therapy: STM to intercostal muscles and plantar fascia B Therapeutic Activity: Self Care: Trigger Point Dry Needling:  Modalities:    PATIENT EDUCATION:  Education details: on anatomy and HEP Person educated: Patient Education method: Explanation, Demonstration, and Handouts Education comprehension: verbalized understanding   HOME EXERCISE PROGRAM: [id number]  ASSESSMENT:  CLINICAL  IMPRESSION: 01/16/2023 Manual work reduced bandlike pain around foot. Reviewed exercises and progressed as ale. Tolerated  all new exercises well but fatigue noted end of session. Reduced tension and pain end of session. Will continue with current POC as tolerated.   Patient presents to PT with chronic episodic plantar fasciitis that has been worse this year and has not resolved with injections or rest. Patient presents with weakness, swelling, limited mobility and altered mechanics that are likely contributing to current presentation. Educated patient on current findings and patient would greatly benefit from skilled PT to improve overall function and QOL.    OBJECTIVE IMPAIRMENTS: decreased activity tolerance, decreased balance, decreased knowledge of use of DME, decreased mobility, difficulty walking, decreased ROM, increased edema, and pain.   ACTIVITY LIMITATIONS: lifting, standing, transfers, and locomotion level  PARTICIPATION LIMITATIONS: cleaning, community activity, and yard work  PERSONAL FACTORS: Time since onset of injury/illness/exacerbation are also affecting patient's functional outcome.   REHAB POTENTIAL: Good  CLINICAL DECISION MAKING: Stable/uncomplicated  EVALUATION COMPLEXITY: Low   GOALS: Goals reviewed with patient? yes  SHORT TERM GOALS: Target date: 01/30/2023   Patient will be independent in self management strategies to improve quality of life and functional outcomes. Baseline: New Program Goal status: INITIAL  2.  Patient will report at least 50% improvement in overall symptoms and/or function to demonstrate improved functional mobility Baseline: 0% better Goal status: INITIAL  3.  Patient will demonstrate no difference in swelling between left and right lower legs  Baseline: current swelling in left  leg Goal status: INITIAL      LONG TERM GOALS: Target date: 02/27/2023   Patient will report at least 75% improvement in overall symptoms and/or  function to demonstrate improved functional mobility Baseline: 0% better Goal status: INITIAL  2.   Patient will be able to perform all daily activities without pain in feet to improve tolerance to ADLS, Baseline:  painful Goal status: INITIAL  3.  Patient will be able to perform 20 single leg heel raises on either foot to demonstrate improved calf strength Baseline: unable Goal status: INITIAL  4.  Patient will report performing regular mobility routine to reduce stiffness in pain in lower legs Baseline: not currently. Goal status: INITIAL     PLAN:  PT FREQUENCY: 2x/week  PT DURATION: 8 weeks  PLANNED INTERVENTIONS: Therapeutic exercises, Therapeutic activity, Neuromuscular re-education, Balance training, Gait training, Patient/Family education, Self Care, Joint mobilization, Joint manipulation, Stair training, Vestibular training, Canalith repositioning, Orthotic/Fit training, Prosthetic training, DME instructions, Aquatic Therapy, Dry Needling, Electrical stimulation, Spinal manipulation, Spinal mobilization, Cryotherapy, Moist heat, Taping, Traction, Ultrasound, Ionotophoresis 4mg /ml Dexamethasone, Manual therapy, and Re-evaluation.   PLAN FOR NEXT SESSION: check leg length, STM lower leg, hip mobility, calf strengthening, balance   3:14 PM, 01/16/23 Tereasa Coop, DPT Physical Therapy with [facility]

## 2023-01-21 ENCOUNTER — Encounter: Payer: BC Managed Care – PPO | Admitting: Physical Therapy

## 2023-01-22 ENCOUNTER — Ambulatory Visit: Payer: BC Managed Care – PPO | Admitting: Physical Therapy

## 2023-01-22 ENCOUNTER — Encounter: Payer: Self-pay | Admitting: Physical Therapy

## 2023-01-22 DIAGNOSIS — G8929 Other chronic pain: Secondary | ICD-10-CM

## 2023-01-22 DIAGNOSIS — M79672 Pain in left foot: Secondary | ICD-10-CM | POA: Diagnosis not present

## 2023-01-22 DIAGNOSIS — R262 Difficulty in walking, not elsewhere classified: Secondary | ICD-10-CM | POA: Diagnosis not present

## 2023-01-22 DIAGNOSIS — M6281 Muscle weakness (generalized): Secondary | ICD-10-CM

## 2023-01-22 NOTE — Therapy (Signed)
OUTPATIENT PHYSICAL THERAPY LOWER EXTREMITY TREATMENT   Patient Name: Vicki [name] MRN: [id number] DOB:08/27/1960, 62 y.o., female Today's Date: 01/22/2023  END OF SESSION:  PT End of Session - 01/22/23 1512     Visit Number 6    Number of Visits 16    Date for PT Re-Evaluation 02/27/23    Authorization Type [employer]    Authorization - Visit Number 6    Authorization - Number of Visits 30    PT Start Time 1517    PT Stop Time 1557    PT Time Calculation (min) 40 min             Past Medical History:  Diagnosis Date   Allergy    Anxiety    hx ?panic disorder   Asthma    Celiac disease    Chronic gastritis    GERD (gastroesophageal reflux disease)    Hyperplastic colon polyp    Migraines    chronic   Schatzki's ring    Skin cancer    chin area   Thyroid disease    Past Surgical History:  Procedure Laterality Date   ABLATION  2012   CESAREAN SECTION  1984   COLONOSCOPY  2011   DILATION AND CURETTAGE OF UTERUS  2012   POLYPECTOMY     TONSILLECTOMY AND ADENOIDECTOMY  1964   TUBAL LIGATION  1998   UPPER GASTROINTESTINAL ENDOSCOPY  2011,1998   Patient Active Problem List   Diagnosis Date Noted   Arthritis of hand 08/12/2021   Acquired trigger finger of left ring finger 08/10/2021   Dupuytren's disease of finger 08/10/2021   Chronic migraine without aura without status migrainosus, not intractable 02/19/2019   Irritable bowel disease 01/14/2018   Gastroesophageal reflux disease without esophagitis 11/09/2015   Belching 11/09/2015   Loose stools 11/09/2015   History of migraine headaches 01/09/2013   Asthma, mild intermittent 01/31/2012   GERD (gastroesophageal reflux disease) 01/31/2012   Celiac disease 01/31/2012   Panic disorder 01/31/2012    PCP: Kristian Covey, MD  REFERRING PROVIDER: Elinor Parkinson, DPM   REFERRING DIAG: M72.2 (ICD-10-CM) - Plantar fasciitis, left   THERAPY DIAG:  Difficulty in walking, not elsewhere classified  Chronic  heel pain, left  Muscle weakness (generalized)  Rationale for Evaluation and Treatment: Rehabilitation  ONSET DATE: Episodic heel pain over the years  SUBJECTIVE:   SUBJECTIVE STATEMENT: 01/22/2023 States she got her compression sleeves and is feeling good. States she has no issues and is currently wearing 20-81mmHG. States she feels good otherwise. Reports overall she feels 90% better since start of PT  Eval: States she used to have heel pain in both feet but it got better. States in March of this year she cleaned the house in crocs and then she got an injection in her left heel. States she didn't do the general exercsies given to her. States she didn't take the pain meds as it upset her gut. States she always wears tennis shoes. States she is very active and she walks her dogs everyday. Gets 15-20K steps a day. Wants to continue to stay active.  Reports her pain returned in May and she got another injection.   States she has pain first thing in the morning and then it feels better in the mid day and then by the end of the day it starts to hurt again. Standing for long periods of time hurts. Reports she wears tennis shoes in the house and has orthotics. States  it feels better with shoes on. States she doesn't want to have surgery.   PERTINENT HISTORY: Migraines PAIN:  Are you having pain? Yes: NPRS scale: 0/10 Pain location: left lateral foot  Pain description: ache  Aggravating factors: walking/standing, AM Relieving factors: walking, shoes, inserts, ice, injections, stretching  PRECAUTIONS: None  RED FLAGS: None   WEIGHT BEARING RESTRICTIONS: No  FALLS:  Has patient fallen in last 6 months? No    OCCUPATION: retired, IT sales professional   PLOF: Independent  PATIENT GOALS: to be able to have less pain and to be  able to paddle board again    OBJECTIVE:   DIAGNOSTIC FINDINGS:  MRI left ankle 12/14/22 IMPRESSION: 1. Mild tendinosis of the posterior tibial tendon. 2.  Plantar fasciitis of the medial band of plantar fascia towards the calcaneal insertion with subcortical reactive marrow edema. No tear of the plantar fascia.   COGNITION: Overall cognitive status: Within functional limits for tasks assessed       EDEMA:  Circumferential: at ankle 18cm Left, 17.5 cm R     PALPATION: Tenderness to palpation along medal sides of lower legs, arches L>R, hypo mobilitiy in mid foot B    LE Measurements Lower Extremity Right 01/22/23 Left 01/22/23   A/PROM MMT A/PROM MMT  Hip Flexion      Hip Extension      Hip Abduction      Hip Adduction      Hip Internal rotation 20  30   Hip External rotation 40  40   Knee Flexion      Knee Extension      Ankle Dorsiflexion 10 5 8 5   Ankle Plantarflexion 50 12 SL heel raises 50 9 SL heel raise  Ankle Inversion 35 5 30 5   Ankle Eversion 0 5 0 5   (Blank rows = not tested) * pain Ankles: Right rest in 10 deg eversion and left rest in 5 degrees      TODAY'S TREATMENT:                                                                                                                              DATE:   01/22/2023  Therapeutic Exercise:  Objective measures updated Supine: Prone: hip IR and ER B x20 5" holds  Seated: piriformis stretch IR and ER x3 30" holds B Neuromuscular Re-education:t Manual Therapy:   Therapeutic Activity: Self Care: Trigger Point Dry Needling:  Modalities:    PATIENT EDUCATION:  Education details: on anatomy and HEP, goals, progress, POC Person educated: Patient Education method: Explanation, Demonstration, and Handouts Education comprehension: verbalized understanding   HOME EXERCISE PROGRAM: [id number]  ASSESSMENT:  CLINICAL IMPRESSION: 01/22/2023 Patient doing very well and has met 2/3 short term goals and 2/4 loglongn term goals. Significantly less pain noted and answered all questions. Will continue progressing patient as tolerated and build on HEP to reduce risk of  re-injury.  Patient presents to PT with chronic  episodic plantar fasciitis that has been worse this year and has not resolved with injections or rest. Patient presents with weakness, swelling, limited mobility and altered mechanics that are likely contributing to current presentation. Educated patient on current findings and patient would greatly benefit from skilled PT to improve overall function and QOL.    OBJECTIVE IMPAIRMENTS: decreased activity tolerance, decreased balance, decreased knowledge of use of DME, decreased mobility, difficulty walking, decreased ROM, increased edema, and pain.   ACTIVITY LIMITATIONS: lifting, standing, transfers, and locomotion level  PARTICIPATION LIMITATIONS: cleaning, community activity, and yard work  PERSONAL FACTORS: Time since onset of injury/illness/exacerbation are also affecting patient's functional outcome.   REHAB POTENTIAL: Good  CLINICAL DECISION MAKING: Stable/uncomplicated  EVALUATION COMPLEXITY: Low   GOALS: Goals reviewed with patient? yes  SHORT TERM GOALS: Target date: 01/30/2023   Patient will be independent in self management strategies to improve quality of life and functional outcomes. Baseline: New Program Goal status: MET  2.  Patient will report at least 50% improvement in overall symptoms and/or function to demonstrate improved functional mobility Baseline: 0% better Goal status: MET  3.  Patient will demonstrate no difference in swelling between left and right lower legs  Baseline: current swelling in left  leg Goal status: PROGRESSING      LONG TERM GOALS: Target date: 02/27/2023   Patient will report at least 75% improvement in overall symptoms and/or function to demonstrate improved functional mobility Baseline: 0% better Goal status: MET  2.   Patient will be able to perform all daily activities without pain in feet to improve tolerance to ADLS, Baseline:  painful Goal status: PROGRESSING  3.  Patient  will be able to perform 20 single leg heel raises on either foot to demonstrate improved calf strength Baseline: unable Goal status: PROGRESSING  4.  Patient will report performing regular mobility routine to reduce stiffness in pain in lower legs Baseline: not currently. Goal status: MET     PLAN:  PT FREQUENCY: 2x/week  PT DURATION: 8 weeks  PLANNED INTERVENTIONS: Therapeutic exercises, Therapeutic activity, Neuromuscular re-education, Balance training, Gait training, Patient/Family education, Self Care, Joint mobilization, Joint manipulation, Stair training, Vestibular training, Canalith repositioning, Orthotic/Fit training, Prosthetic training, DME instructions, Aquatic Therapy, Dry Needling, Electrical stimulation, Spinal manipulation, Spinal mobilization, Cryotherapy, Moist heat, Taping, Traction, Ultrasound, Ionotophoresis 4mg /ml Dexamethasone, Manual therapy, and Re-evaluation.   PLAN FOR NEXT SESSION: check leg length, STM lower leg, hip mobility, calf strengthening, balance   4:00 PM, 01/22/23 Tereasa Coop, DPT Physical Therapy with [facility]

## 2023-01-23 ENCOUNTER — Encounter: Payer: BC Managed Care – PPO | Admitting: Physical Therapy

## 2023-01-24 ENCOUNTER — Ambulatory Visit (INDEPENDENT_AMBULATORY_CARE_PROVIDER_SITE_OTHER): Payer: BC Managed Care – PPO | Admitting: Physical Therapy

## 2023-01-24 ENCOUNTER — Encounter: Payer: Self-pay | Admitting: Physical Therapy

## 2023-01-24 ENCOUNTER — Encounter: Payer: Self-pay | Admitting: Family Medicine

## 2023-01-24 DIAGNOSIS — G43709 Chronic migraine without aura, not intractable, without status migrainosus: Secondary | ICD-10-CM

## 2023-01-24 DIAGNOSIS — G8929 Other chronic pain: Secondary | ICD-10-CM | POA: Diagnosis not present

## 2023-01-24 DIAGNOSIS — R262 Difficulty in walking, not elsewhere classified: Secondary | ICD-10-CM | POA: Diagnosis not present

## 2023-01-24 DIAGNOSIS — M6281 Muscle weakness (generalized): Secondary | ICD-10-CM | POA: Diagnosis not present

## 2023-01-24 DIAGNOSIS — M79672 Pain in left foot: Secondary | ICD-10-CM

## 2023-01-24 NOTE — Therapy (Signed)
OUTPATIENT PHYSICAL THERAPY LOWER EXTREMITY TREATMENT   Patient Name: Vicki [name] MRN: [id number] DOB:November 26, 1960, 62 y.o., female Today's Date: 01/24/2023  END OF SESSION:  PT End of Session - 01/24/23 0933     Visit Number 7    Number of Visits 16    Date for PT Re-Evaluation 02/27/23    Authorization Type [employer]    Authorization - Visit Number 7    Authorization - Number of Visits 30    PT Start Time 0934    PT Stop Time 1014    PT Time Calculation (min) 40 min             Past Medical History:  Diagnosis Date   Allergy    Anxiety    hx ?panic disorder   Asthma    Celiac disease    Chronic gastritis    GERD (gastroesophageal reflux disease)    Hyperplastic colon polyp    Migraines    chronic   Schatzki's ring    Skin cancer    chin area   Thyroid disease    Past Surgical History:  Procedure Laterality Date   ABLATION  2012   CESAREAN SECTION  1984   COLONOSCOPY  2011   DILATION AND CURETTAGE OF UTERUS  2012   POLYPECTOMY     TONSILLECTOMY AND ADENOIDECTOMY  1964   TUBAL LIGATION  1998   UPPER GASTROINTESTINAL ENDOSCOPY  2011,1998   Patient Active Problem List   Diagnosis Date Noted   Arthritis of hand 08/12/2021   Acquired trigger finger of left ring finger 08/10/2021   Dupuytren's disease of finger 08/10/2021   Chronic migraine without aura without status migrainosus, not intractable 02/19/2019   Irritable bowel disease 01/14/2018   Gastroesophageal reflux disease without esophagitis 11/09/2015   Belching 11/09/2015   Loose stools 11/09/2015   History of migraine headaches 01/09/2013   Asthma, mild intermittent 01/31/2012   GERD (gastroesophageal reflux disease) 01/31/2012   Celiac disease 01/31/2012   Panic disorder 01/31/2012    PCP: Kristian Covey, MD  REFERRING PROVIDER: Elinor Parkinson, DPM   REFERRING DIAG: M72.2 (ICD-10-CM) - Plantar fasciitis, left   THERAPY DIAG:  Difficulty in walking, not elsewhere classified  Chronic  heel pain, left  Muscle weakness (generalized)  Rationale for Evaluation and Treatment: Rehabilitation  ONSET DATE: Episodic heel pain over the years  SUBJECTIVE:   SUBJECTIVE STATEMENT: 01/24/2023 States she has been having a lot of migraines. States she has rescue medication and it doesn't really help. States it is limiting her ability to do her HEP  Eval: States she used to have heel pain in both feet but it got better. States in March of this year she cleaned the house in crocs and then she got an injection in her left heel. States she didn't do the general exercsies given to her. States she didn't take the pain meds as it upset her gut. States she always wears tennis shoes. States she is very active and she walks her dogs everyday. Gets 15-20K steps a day. Wants to continue to stay active.  Reports her pain returned in May and she got another injection.   States she has pain first thing in the morning and then it feels better in the mid day and then by the end of the day it starts to hurt again. Standing for long periods of time hurts. Reports she wears tennis shoes in the house and has orthotics. States it feels better with shoes on.  States she doesn't want to have surgery.   PERTINENT HISTORY: Migraines PAIN:  Are you having pain? Yes: NPRS scale: 6/10 Pain location: left sided above her eye, left occiput, migraine  Pain description: ache  Aggravating factors: M Relieving factors: unsure  PRECAUTIONS: None  RED FLAGS: None   WEIGHT BEARING RESTRICTIONS: No  FALLS:  Has patient fallen in last 6 months? No    OCCUPATION: retired, IT sales professional   PLOF: Independent  PATIENT GOALS: to be able to have less pain and to be  able to paddle board again    OBJECTIVE:   DIAGNOSTIC FINDINGS:  MRI left ankle 12/14/22 IMPRESSION: 1. Mild tendinosis of the posterior tibial tendon. 2. Plantar fasciitis of the medial band of plantar fascia towards the calcaneal insertion  with subcortical reactive marrow edema. No tear of the plantar fascia.   COGNITION: Overall cognitive status: Within functional limits for tasks assessed       EDEMA:  Circumferential: at ankle 18cm Left, 17.5 cm R     PALPATION: Tenderness to palpation along medal sides of lower legs, arches L>R, hypo mobilitiy in mid foot B    LE Measurements Lower Extremity Right 01/22/23 Left 01/22/23   A/PROM MMT A/PROM MMT  Hip Flexion      Hip Extension      Hip Abduction      Hip Adduction      Hip Internal rotation 20  30   Hip External rotation 40  40   Knee Flexion      Knee Extension      Ankle Dorsiflexion 10 5 8 5   Ankle Plantarflexion 50 12 SL heel raises 50 9 SL heel raise  Ankle Inversion 35 5 30 5   Ankle Eversion 0 5 0 5   (Blank rows = not tested) * pain Ankles: Right rest in 10 deg eversion and left rest in 5 degrees      TODAY'S TREATMENT:                                                                                                                              DATE:   01/24/2023  Therapeutic Exercise: Review of HEP Supine:thoracic mobilization with towel down back 5 minutes isometric, chin tucks 2x15 5" holds Prone:    Seated:   Neuromuscular Re-education:t Manual Therapy:  STM to cervical muscles and facial muscles, tolerated well. Gentle medial and PA glides to cervical spine grade II - tolerated well B. Gentle cervical traction - felt good Therapeutic Activity: Self Care: Trigger Point Dry Needling:  Modalities:    PATIENT EDUCATION:  Education details: on rationale behind interventions, how to safely massage neck, on f/u with MD about current migraine medication/possible neuro consult pending MD assessment Person educated: Patient Education method: Explanation, Demonstration, and Handouts Education comprehension: verbalized understanding   HOME EXERCISE PROGRAM: [id number]  ASSESSMENT:  CLINICAL IMPRESSION: 01/24/2023 Patient with  increased neck pain and migraine on this date limiting  participation in PT for LE. Focused on gently massage trying to reduce pain to improve tolerance to HEP. Discussed importance of MD f/u along with persistent intense headache symptoms cause to go to ED. Educated patient and answered all questions. Patient with reduced pain and headache end of session but mild neck soreness. Will f/u with symptoms and continue with current POC as tolerated.   Patient presents to PT with chronic episodic plantar fasciitis that has been worse this year and has not resolved with injections or rest. Patient presents with weakness, swelling, limited mobility and altered mechanics that are likely contributing to current presentation. Educated patient on current findings and patient would greatly benefit from skilled PT to improve overall function and QOL.    OBJECTIVE IMPAIRMENTS: decreased activity tolerance, decreased balance, decreased knowledge of use of DME, decreased mobility, difficulty walking, decreased ROM, increased edema, and pain.   ACTIVITY LIMITATIONS: lifting, standing, transfers, and locomotion level  PARTICIPATION LIMITATIONS: cleaning, community activity, and yard work  PERSONAL FACTORS: Time since onset of injury/illness/exacerbation are also affecting patient's functional outcome.   REHAB POTENTIAL: Good  CLINICAL DECISION MAKING: Stable/uncomplicated  EVALUATION COMPLEXITY: Low   GOALS: Goals reviewed with patient? yes  SHORT TERM GOALS: Target date: 01/30/2023   Patient will be independent in self management strategies to improve quality of life and functional outcomes. Baseline: New Program Goal status: MET  2.  Patient will report at least 50% improvement in overall symptoms and/or function to demonstrate improved functional mobility Baseline: 0% better Goal status: MET  3.  Patient will demonstrate no difference in swelling between left and right lower legs  Baseline: current  swelling in left  leg Goal status: PROGRESSING      LONG TERM GOALS: Target date: 02/27/2023   Patient will report at least 75% improvement in overall symptoms and/or function to demonstrate improved functional mobility Baseline: 0% better Goal status: MET  2.   Patient will be able to perform all daily activities without pain in feet to improve tolerance to ADLS, Baseline:  painful Goal status: PROGRESSING  3.  Patient will be able to perform 20 single leg heel raises on either foot to demonstrate improved calf strength Baseline: unable Goal status: PROGRESSING  4.  Patient will report performing regular mobility routine to reduce stiffness in pain in lower legs Baseline: not currently. Goal status: MET     PLAN:  PT FREQUENCY: 2x/week  PT DURATION: 8 weeks  PLANNED INTERVENTIONS: Therapeutic exercises, Therapeutic activity, Neuromuscular re-education, Balance training, Gait training, Patient/Family education, Self Care, Joint mobilization, Joint manipulation, Stair training, Vestibular training, Canalith repositioning, Orthotic/Fit training, Prosthetic training, DME instructions, Aquatic Therapy, Dry Needling, Electrical stimulation, Spinal manipulation, Spinal mobilization, Cryotherapy, Moist heat, Taping, Traction, Ultrasound, Ionotophoresis 4mg /ml Dexamethasone, Manual therapy, and Re-evaluation.   PLAN FOR NEXT SESSION: check leg length, STM lower leg, hip mobility, calf strengthening, balance   10:39 AM, 01/24/23 Tereasa Coop, DPT Physical Therapy with [facility]

## 2023-01-25 ENCOUNTER — Telehealth: Payer: Self-pay | Admitting: Family Medicine

## 2023-01-25 MED ORDER — UBRELVY 100 MG PO TABS: ORAL_TABLET | ORAL | 5 refills | Status: DC

## 2023-01-25 NOTE — Telephone Encounter (Signed)
Pt states that Dr. Caryl Never called in a prescription for her, Vicki [name]. However, her insurance company, [employer] is requesting for Dr. Caryl Never to send a note to her pharmacy stating that pt needs this medication. I let her know I would send msg back to care team to make them aware.

## 2023-01-29 NOTE — Telephone Encounter (Signed)
PA has been initiated and sent to plan. Patient has tried Sumatriptan and this has been included in clinical notes.  Key: [id number]

## 2023-01-30 ENCOUNTER — Ambulatory Visit (INDEPENDENT_AMBULATORY_CARE_PROVIDER_SITE_OTHER): Payer: BC Managed Care – PPO | Admitting: Physical Therapy

## 2023-01-30 ENCOUNTER — Encounter: Payer: Self-pay | Admitting: Physical Therapy

## 2023-01-30 DIAGNOSIS — G8929 Other chronic pain: Secondary | ICD-10-CM | POA: Diagnosis not present

## 2023-01-30 DIAGNOSIS — D225 Melanocytic nevi of trunk: Secondary | ICD-10-CM | POA: Diagnosis not present

## 2023-01-30 DIAGNOSIS — M79672 Pain in left foot: Secondary | ICD-10-CM

## 2023-01-30 DIAGNOSIS — M6281 Muscle weakness (generalized): Secondary | ICD-10-CM | POA: Diagnosis not present

## 2023-01-30 DIAGNOSIS — L813 Cafe au lait spots: Secondary | ICD-10-CM | POA: Diagnosis not present

## 2023-01-30 DIAGNOSIS — R262 Difficulty in walking, not elsewhere classified: Secondary | ICD-10-CM | POA: Diagnosis not present

## 2023-01-30 DIAGNOSIS — L814 Other melanin hyperpigmentation: Secondary | ICD-10-CM | POA: Diagnosis not present

## 2023-01-30 DIAGNOSIS — L821 Other seborrheic keratosis: Secondary | ICD-10-CM | POA: Diagnosis not present

## 2023-01-30 NOTE — Therapy (Signed)
OUTPATIENT PHYSICAL THERAPY LOWER EXTREMITY TREATMENT PHYSICAL THERAPY DISCHARGE SUMMARY  Visits from Start of Care: 8  Current functional level related to goals / functional outcomes: See below   Remaining deficits: See below   Education / Equipment: See below   Patient agrees to discharge. Patient goals were partially met. Patient is being discharged due to being pleased with the current functional level.   Patient Name: Vicki [name] MRN: [id number] DOB:1961/02/14, 62 y.o., female Today's Date: 01/30/2023  END OF SESSION:  PT End of Session - 01/30/23 1513     Visit Number 8    Number of Visits 16    Date for PT Re-Evaluation 02/27/23    Authorization Type BCBS    Authorization - Visit Number 8    Authorization - Number of Visits 30    PT Start Time 1515    PT Stop Time 1555    PT Time Calculation (min) 40 min             Past Medical History:  Diagnosis Date   Allergy    Anxiety    hx ?panic disorder   Asthma    Celiac disease    Chronic gastritis    GERD (gastroesophageal reflux disease)    Hyperplastic colon polyp    Migraines    chronic   Schatzki's ring    Skin cancer    chin area   Thyroid disease    Past Surgical History:  Procedure Laterality Date   ABLATION  2012   CESAREAN SECTION  1984   COLONOSCOPY  2011   DILATION AND CURETTAGE OF UTERUS  2012   POLYPECTOMY     TONSILLECTOMY AND ADENOIDECTOMY  1964   TUBAL LIGATION  1998   UPPER GASTROINTESTINAL ENDOSCOPY  2011,1998   Patient Active Problem List   Diagnosis Date Noted   Arthritis of hand 08/12/2021   Acquired trigger finger of left ring finger 08/10/2021   Dupuytren's disease of finger 08/10/2021   Chronic migraine without aura without status migrainosus, not intractable 02/19/2019   Irritable bowel disease 01/14/2018   Gastroesophageal reflux disease without esophagitis 11/09/2015   Belching 11/09/2015   Loose stools 11/09/2015   History of migraine headaches 01/09/2013    Asthma, mild intermittent 01/31/2012   GERD (gastroesophageal reflux disease) 01/31/2012   Celiac disease 01/31/2012   Panic disorder 01/31/2012    PCP: Kristian Covey, MD  REFERRING PROVIDER: Elinor Parkinson, DPM   REFERRING DIAG: M72.2 (ICD-10-CM) - Plantar fasciitis, left   THERAPY DIAG:  Difficulty in walking, not elsewhere classified  Chronic heel pain, left  Muscle weakness (generalized)  Rationale for Evaluation and Treatment: Rehabilitation  ONSET DATE: Episodic heel pain over the years  SUBJECTIVE:   SUBJECTIVE STATEMENT: 01/30/2023 States her migraine finally broke. States she has been having consistent pain now on both sides of her left heel. Minimal change with walking, and taking off compression socks.  Eval: States she used to have heel pain in both feet but it got better. States in March of this year she cleaned the house in crocs and then she got an injection in her left heel. States she didn't do the general exercsies given to her. States she didn't take the pain meds as it upset her gut. States she always wears tennis shoes. States she is very active and she walks her dogs everyday. Gets 15-20K steps a day. Wants to continue to stay active.  Reports her pain returned in May and she got  another injection.   States she has pain first thing in the morning and then it feels better in the mid day and then by the end of the day it starts to hurt again. Standing for long periods of time hurts. Reports she wears tennis shoes in the house and has orthotics. States it feels better with shoes on. States she doesn't want to have surgery.   PERTINENT HISTORY: Migraines PAIN:  Are you having pain? Yes: NPRS scale: 3/10 Pain location: left medial and lateral heel  Pain description: soreness  Aggravating factors: M Relieving factors: unsure  PRECAUTIONS: None  RED FLAGS: None   WEIGHT BEARING RESTRICTIONS: No  FALLS:  Has patient fallen in last 6 months?  No    OCCUPATION: retired, IT sales professional   PLOF: Independent  PATIENT GOALS: to be able to have less pain and to be  able to paddle board again    OBJECTIVE:   DIAGNOSTIC FINDINGS:  MRI left ankle 12/14/22 IMPRESSION: 1. Mild tendinosis of the posterior tibial tendon. 2. Plantar fasciitis of the medial band of plantar fascia towards the calcaneal insertion with subcortical reactive marrow edema. No tear of the plantar fascia.   COGNITION: Overall cognitive status: Within functional limits for tasks assessed         PALPATION: Tenderness to palpation along medal sides of lower legs, arches L>R, hypo mobilitiy in mid foot B    LE Measurements Lower Extremity Right 01/22/23 Left 01/22/23   A/PROM MMT A/PROM MMT  Hip Flexion      Hip Extension      Hip Abduction      Hip Adduction      Hip Internal rotation 20  30   Hip External rotation 40  40   Knee Flexion      Knee Extension      Ankle Dorsiflexion 10 5 8 5   Ankle Plantarflexion 50 12 SL heel raises 50 9 SL heel raise  Ankle Inversion 35 5 30 5   Ankle Eversion 0 5 0 5   (Blank rows = not tested) * pain Ankles: Right rest in 10 deg eversion and left rest in 5 degrees      TODAY'S TREATMENT:                                                                                                                              DATE:   01/30/2023  Therapeutic Exercise: Review of HEP  Prone:    Seated:   Neuromuscular Re-education:t Manual Therapy:  STM to heel and vibration to heel, STM to inner and outer lower left leg - pain resolved Therapeutic Activity: Self Care: Trigger Point Dry Needling:  Modalities:    PATIENT EDUCATION:  Education details:on HEP, POC and plan moving forward, answered all questions about continued interventions and frequency of HEP Person educated: Patient Education method: Explanation, Demonstration, and Handouts Education comprehension: verbalized understanding   HOME EXERCISE  PROGRAM: [id number]  ASSESSMENT:  CLINICAL IMPRESSION: 01/30/2023 Patient doing well. Resolved symptoms after manual. Educated patient in self manual to address symptoms if they return. Overall patient doing well and is to DC from PT to HEP at this time.   Patient presents to PT with chronic episodic plantar fasciitis that has been worse this year and has not resolved with injections or rest. Patient presents with weakness, swelling, limited mobility and altered mechanics that are likely contributing to current presentation. Educated patient on current findings and patient would greatly benefit from skilled PT to improve overall function and QOL.    OBJECTIVE IMPAIRMENTS: decreased activity tolerance, decreased balance, decreased knowledge of use of DME, decreased mobility, difficulty walking, decreased ROM, increased edema, and pain.   ACTIVITY LIMITATIONS: lifting, standing, transfers, and locomotion level  PARTICIPATION LIMITATIONS: cleaning, community activity, and yard work  PERSONAL FACTORS: Time since onset of injury/illness/exacerbation are also affecting patient's functional outcome.   REHAB POTENTIAL: Good  CLINICAL DECISION MAKING: Stable/uncomplicated  EVALUATION COMPLEXITY: Low   GOALS: Goals reviewed with patient? yes  SHORT TERM GOALS: Target date: 01/30/2023   Patient will be independent in self management strategies to improve quality of life and functional outcomes. Baseline: New Program Goal status: MET  2.  Patient will report at least 50% improvement in overall symptoms and/or function to demonstrate improved functional mobility Baseline: 0% better Goal status: MET  3.  Patient will demonstrate no difference in swelling between left and right lower legs  Baseline: current swelling in left  leg Goal status: MET      LONG TERM GOALS: Target date: 02/27/2023   Patient will report at least 75% improvement in overall symptoms and/or function to demonstrate  improved functional mobility Baseline: 0% better Goal status: MET  2.   Patient will be able to perform all daily activities without pain in feet to improve tolerance to ADLS, Baseline:  painful Goal status: MET  3.  Patient will be able to perform 20 single leg heel raises on either foot to demonstrate improved calf strength Baseline: unable Goal status: PROGRESSING  4.  Patient will report performing regular mobility routine to reduce stiffness in pain in lower legs Baseline: not currently. Goal status: MET     PLAN:  PT FREQUENCY: 2x/week  PT DURATION: 8 weeks  PLANNED INTERVENTIONS: Therapeutic exercises, Therapeutic activity, Neuromuscular re-education, Balance training, Gait training, Patient/Family education, Self Care, Joint mobilization, Joint manipulation, Stair training, Vestibular training, Canalith repositioning, Orthotic/Fit training, Prosthetic training, DME instructions, Aquatic Therapy, Dry Needling, Electrical stimulation, Spinal manipulation, Spinal mobilization, Cryotherapy, Moist heat, Taping, Traction, Ultrasound, Ionotophoresis 4mg /ml Dexamethasone, Manual therapy, and Re-evaluation.   PLAN FOR NEXT SESSION: DC to HEP  4:02 PM, 01/30/23 Tereasa Coop, DPT Physical Therapy with [facility]

## 2023-01-31 NOTE — Addendum Note (Signed)
Addended by: Johnella Moloney on: 01/31/2023 01:27 PM   Modules accepted: Orders

## 2023-02-01 ENCOUNTER — Telehealth: Payer: Self-pay

## 2023-02-01 NOTE — Telephone Encounter (Signed)
Pharmacy Patient Advocate Encounter   Received notification from CoverMyMeds that prior authorization for Ubrelvy 100MG  tablets is required/requested.   Insurance verification completed.   The patient is insured through [facility] .   Per test claim: PA required; PA submitted to [employer] via CoverMyMeds Key/confirmation #/EOC [id number] Status is pending

## 2023-02-03 NOTE — Therapy (Unsigned)
OUTPATIENT PHYSICAL THERAPY CERVICAL EVALUATION   Patient Name: Vicki [name] MRN: [id number] DOB:02-04-1961, 62 y.o., female Today's Date: 02/03/2023  END OF SESSION:   Past Medical History:  Diagnosis Date   Allergy    Anxiety    hx ?panic disorder   Asthma    Celiac disease    Chronic gastritis    GERD (gastroesophageal reflux disease)    Hyperplastic colon polyp    Migraines    chronic   Schatzki's ring    Skin cancer    chin area   Thyroid disease    Past Surgical History:  Procedure Laterality Date   ABLATION  2012   CESAREAN SECTION  1984   COLONOSCOPY  2011   DILATION AND CURETTAGE OF UTERUS  2012   POLYPECTOMY     TONSILLECTOMY AND ADENOIDECTOMY  1964   TUBAL LIGATION  1998   UPPER GASTROINTESTINAL ENDOSCOPY  2011,1998   Patient Active Problem List   Diagnosis Date Noted   Arthritis of hand 08/12/2021   Acquired trigger finger of left ring finger 08/10/2021   Dupuytren's disease of finger 08/10/2021   Chronic migraine without aura without status migrainosus, not intractable 02/19/2019   Irritable bowel disease 01/14/2018   Gastroesophageal reflux disease without esophagitis 11/09/2015   Belching 11/09/2015   Loose stools 11/09/2015   History of migraine headaches 01/09/2013   Asthma, mild intermittent 01/31/2012   GERD (gastroesophageal reflux disease) 01/31/2012   Celiac disease 01/31/2012   Panic disorder 01/31/2012    PCP: Kristian Covey, MD  REFERRING PROVIDER: Kristian Covey, MD  REFERRING DIAG: G43.709 (ICD-10-CM) - Chronic migraine without aura without status migrainosus, not intractable  THERAPY DIAG:  No diagnosis found.  Rationale for Evaluation and Treatment: Rehabilitation  ONSET DATE: Chronic recent onset migraine within last month  SUBJECTIVE:                                                                                                                                                                                                          SUBJECTIVE STATEMENT: *** Hand dominance: {MISC; OT HAND [phone]}  PERTINENT HISTORY:  Migraines  PAIN:  Are you having pain? Yes: NPRS scale: ***/10 Pain location: *** Pain description: *** Aggravating factors: *** Relieving factors: ***  PRECAUTIONS: {Therapy precautions:24002}  RED FLAGS: None     WEIGHT BEARING RESTRICTIONS: No  FALLS:  Has patient fallen in last 6 months? No   OCCUPATION: retired, walks 2 labs regularly  PLOF: Independent  PATIENT GOALS: to manage migraines better    OBJECTIVE:  DIAGNOSTIC FINDINGS:  No imaging at this time  PATIENT SURVEYS:  FOTO ***  COGNITION: Overall cognitive status: {cognition:24006}  SENSATION: {sensation:27233}  POSTURE: {posture:25561}  PALPATION: ***   CERVICAL ROM:   Active ROM A/PROM (deg) eval  Flexion   Extension   Right lateral flexion   Left lateral flexion   Right rotation   Left rotation    (Blank rows = not tested)    UE Measurements Upper Extremity Right EVAL Left EVAL   A/PROM MMT A/PROM MMT  Shoulder Flexion      Shoulder Extension      Shoulder Abduction      Shoulder Adduction      Shoulder Internal Rotation      Shoulder External Rotation      Elbow Flexion      Elbow Extension      Wrist Flexion      Wrist Extension      Wrist Supination      Wrist Pronation      Wrist Ulnar Deviation      Wrist Radial Deviation      Grip Strength NA  NA     (Blank rows = not tested)   * pain   CERVICAL SPECIAL TESTS:  {Cervical special tests:25246}  FUNCTIONAL TESTS:  {Functional tests:24029}  TODAY'S TREATMENT:                                                                                                                              DATE: ***  02/03/2023  Therapeutic Exercise:  Aerobic: Supine: Prone:  Seated:  Standing: Neuromuscular Re-education: Manual Therapy: Therapeutic Activity: Self Care: Trigger Point Dry  Needling:  Modalities:    PATIENT EDUCATION:  Education details: on current presentation, on HEP, on clinical outcomes score and POC Person educated: Patient Education method: Explanation, Demonstration, and Handouts Education comprehension: verbalized understanding   HOME EXERCISE PROGRAM: ***[id number]  - previous [facility]  ASSESSMENT:  CLINICAL IMPRESSION: Patient is a *** y.o. *** who was seen today for physical therapy evaluation and treatment for ***.   OBJECTIVE IMPAIRMENTS: {opptimpairments:25111}.   ACTIVITY LIMITATIONS: {activitylimitations:27494}  PARTICIPATION LIMITATIONS: {participationrestrictions:25113}  PERSONAL FACTORS: {Personal factors:25162} are also affecting patient's functional outcome.   REHAB POTENTIAL: {rehabpotential:25112}  CLINICAL DECISION MAKING: {clinical decision making:25114}  EVALUATION COMPLEXITY: {Evaluation complexity:25115}   GOALS: Goals reviewed with patient? yes  SHORT TERM GOALS: Target date: {follow up:25551} *** MAKE TEXT EDITABLE Patient will be independent in self management strategies to improve quality of life and functional outcomes. Baseline: New Program Goal status: INITIAL  2.  Patient will report at least 50% improvement in overall symptoms and/or function to demonstrate improved functional mobility Baseline: 0% better Goal status: INITIAL  3.  *** Baseline:  Goal status: INITIAL  4.  *** Baseline:  Goal status: INITIAL    LONG TERM GOALS: Target date: {follow up:25551} *** MAKE TEXT EDITABLE  Patient will report at least 75% improvement  in overall symptoms and/or function to demonstrate improved functional mobility Baseline: 0% better Goal status: INITIAL  2.  Patient will improve score on FOTO outcomes measure to projected score to demonstrate overall improved function and QOL Baseline: see above Goal status: INITIAL  3.  *** Baseline:  Goal status: INITIAL  4.  *** Baseline:   Goal status: INITIAL    PLAN:  PT FREQUENCY: {rehab frequency:25116}  PT DURATION: {rehab duration:25117}  PLANNED INTERVENTIONS: Therapeutic exercises, Therapeutic activity, Neuromuscular re-education, Balance training, Gait training, Patient/Family education, Self Care, Joint mobilization, Joint manipulation, Stair training, Vestibular training, Canalith repositioning, Orthotic/Fit training, Prosthetic training, DME instructions, Aquatic Therapy, Dry Needling, Electrical stimulation, Spinal manipulation, Spinal mobilization, Cryotherapy, Moist heat, Taping, Traction, Ultrasound, Ionotophoresis 4mg /ml Dexamethasone, Manual therapy, and Re-evaluation.   PLAN FOR NEXT SESSION: ***   5:18 PM, 02/03/23 Tereasa Coop, DPT Physical Therapy with

## 2023-02-04 NOTE — Telephone Encounter (Signed)
Pharmacy Patient Advocate Encounter  Received notification from [facility] that Prior Authorization for Ubrelvy 100MG  tablets has been CANCELLED due to Authorization On File.Authorization On File.Please note that authorization is already on file for the requested medication and is effective through (04/23/2023).paid claim 01/29/2023    PA #/Case ID/Reference #:  [id number]

## 2023-02-05 ENCOUNTER — Ambulatory Visit (INDEPENDENT_AMBULATORY_CARE_PROVIDER_SITE_OTHER): Payer: BC Managed Care – PPO | Admitting: Physical Therapy

## 2023-02-05 ENCOUNTER — Encounter: Payer: Self-pay | Admitting: Physical Therapy

## 2023-02-05 DIAGNOSIS — G44221 Chronic tension-type headache, intractable: Secondary | ICD-10-CM

## 2023-02-05 DIAGNOSIS — M542 Cervicalgia: Secondary | ICD-10-CM

## 2023-02-05 DIAGNOSIS — M6281 Muscle weakness (generalized): Secondary | ICD-10-CM

## 2023-02-19 ENCOUNTER — Encounter: Payer: Self-pay | Admitting: Physical Therapy

## 2023-02-19 ENCOUNTER — Ambulatory Visit (INDEPENDENT_AMBULATORY_CARE_PROVIDER_SITE_OTHER): Payer: BC Managed Care – PPO | Admitting: Physical Therapy

## 2023-02-19 DIAGNOSIS — M6281 Muscle weakness (generalized): Secondary | ICD-10-CM | POA: Diagnosis not present

## 2023-02-19 DIAGNOSIS — M542 Cervicalgia: Secondary | ICD-10-CM

## 2023-02-19 DIAGNOSIS — G44221 Chronic tension-type headache, intractable: Secondary | ICD-10-CM

## 2023-02-19 NOTE — Therapy (Signed)
OUTPATIENT PHYSICAL THERAPY CERVICAL TREATMENT   Patient Name: Vicki [name] MRN: [id number] DOB:1961/04/16, 62 y.o., female Today's Date: 02/19/2023  END OF SESSION:  PT End of Session - 02/19/23 0846     Visit Number 2    Number of Visits 12    Date for PT Re-Evaluation 04/30/23    Authorization Type [employer], VL 30 - 8 used- 22 left    Authorization - Visit Number 2    Authorization - Number of Visits 22    PT Start Time 0849    PT Stop Time 0927    PT Time Calculation (min) 38 min    Activity Tolerance Patient tolerated treatment well    Behavior During Therapy WFL for tasks assessed/performed             Past Medical History:  Diagnosis Date   Allergy    Anxiety    hx ?panic disorder   Asthma    Celiac disease    Chronic gastritis    GERD (gastroesophageal reflux disease)    Hyperplastic colon polyp    Migraines    chronic   Schatzki's ring    Skin cancer    chin area   Thyroid disease    Past Surgical History:  Procedure Laterality Date   ABLATION  2012   CESAREAN SECTION  1984   COLONOSCOPY  2011   DILATION AND CURETTAGE OF UTERUS  2012   POLYPECTOMY     TONSILLECTOMY AND ADENOIDECTOMY  1964   TUBAL LIGATION  1998   UPPER GASTROINTESTINAL ENDOSCOPY  2011,1998   Patient Active Problem List   Diagnosis Date Noted   Arthritis of hand 08/12/2021   Acquired trigger finger of left ring finger 08/10/2021   Dupuytren's disease of finger 08/10/2021   Chronic migraine without aura without status migrainosus, not intractable 02/19/2019   Irritable bowel disease 01/14/2018   Gastroesophageal reflux disease without esophagitis 11/09/2015   Belching 11/09/2015   Loose stools 11/09/2015   History of migraine headaches 01/09/2013   Asthma, mild intermittent 01/31/2012   GERD (gastroesophageal reflux disease) 01/31/2012   Celiac disease 01/31/2012   Panic disorder 01/31/2012    PCP: Kristian Covey, MD  REFERRING PROVIDER: Kristian Covey,  MD  REFERRING DIAG: G43.709 (ICD-10-CM) - Chronic migraine without aura without status migrainosus, not intractable  THERAPY DIAG:  Cervicalgia  Muscle weakness (generalized)  Chronic tension-type headache, intractable  Rationale for Evaluation and Treatment: Rehabilitation  ONSET DATE: Chronic recent onset migraine within last month  SUBJECTIVE:  SUBJECTIVE STATEMENT: 02/19/2023 States she has been doing her exercises but has not had a migraine.   EVAL: States she had a recent headache that lasted for 5 days. States she started [place]  and it worked so much better and had no migraine hangover. States usually she gets migraines 1-2x/month and notes it can come on with fatigue or stress. Reports she doesn't always get an aura. States that skipping meals or traveling can bring on headaches/migraines. States she has not noticed any food correlations at this time.    States she carries stress and tension in shoulders and neck.  Patient is right handed  PERTINENT HISTORY:  Migraines, B frozen shoulder, hairline fracture history in left humerus  PAIN:  Are you having pain? Yes: NPRS scale: 0/10 Pain location: B neck and shoulder Pain description: tension, tightness Aggravating factors: fatigue, stress, skipping meals Relieving factors: dark room, quiet, going bed, medication  PRECAUTIONS: None  RED FLAGS: None     WEIGHT BEARING RESTRICTIONS: No  FALLS:  Has patient fallen in last 6 months? No   OCCUPATION: retired, walks 2 labs regularly  PLOF: Independent  PATIENT GOALS: to manage migraines better    OBJECTIVE:   DIAGNOSTIC FINDINGS:  No imaging at this time   COGNITION: Overall cognitive status: Within functional limits for tasks assessed  SENSATION: Not  tested  POSTURE: rounded shoulders, forward head, and flexed trunk   PALPATION: Tenderness and increased resting in B infraspinatus, thoracic paraspinals and rhomboids, B suboccipitals and cervical paraspinals, SCM R>L  CERVICAL ROM:   Active ROM A/PROM (deg) eval  Flexion WFL  Extension WFL  Right lateral flexion 55  Left lateral flexion 45  Right rotation 20  Left rotation 10*   (Blank rows = not tested) *tightness    UE Measurements Upper Extremity Right EVAL Left EVAL   A/PROM MMT A/PROM MMT  Shoulder Flexion WFL 4- WFL 4  Shoulder Extension      Shoulder Abduction WFL 4- WFL 4  Shoulder Adduction      Shoulder Internal Rotation Reaches to T12 SP* 3+ Reaches to T8 SP* 4-  Shoulder External Rotation Reaches to T1 SP* 3+ Reaches to T1 SP 4-  Elbow Flexion      Elbow Extension      Wrist Flexion      Wrist Extension      Wrist Supination      Wrist Pronation      Wrist Ulnar Deviation      Wrist Radial Deviation      Grip Strength NA  NA     (Blank rows = not tested)   * tension Jefm Petty     TODAY'S TREATMENT:                                                                                                                              DATE:   02/19/2023  Therapeutic Exercise:  Aerobic: Supine: serratus punch - 2x10  5" holds, CHIN TUCKS 2X 10 5" HOLDS, TOWEL ROLL down back 2 minutes, then with rocking back and forth 2 minutes Prone:  Seated:  Standing: Neuromuscular Re-education: Supine long exhale breathing then progressed to sitting long exhale breathing, seated scapular protraction with small ball inhale between shoulder blades tolerated well transition to medium size ball in quadruped still not tolerated well transition to large ball tolerated best 20 minutes total Manual Therapy: Therapeutic Activity: Self Care: Trigger Point Dry Needling:  Modalities:    PATIENT EDUCATION:  Education details: on HEP, on anatomy and rationale behind each  exercise Person educated: Patient Education method: Explanation, Demonstration, and Handouts Education comprehension: verbalized understanding   HOME EXERCISE PROGRAM: [id number]  - previous [facility] from foot re[facility] [id number]  ASSESSMENT:  CLINICAL IMPRESSION: 02/19/2023 Session focused on review of home exercise program as well as progression of exercises.  Focused on breathing mechanics and improving posterior rib movement as well as lower rib movement.  Tolerated this well in appropriate positions.  Tactile and verbal cues throughout session.  Focused on education of anatomy and rationale behind each intervention and exercise.  Added all new interventions to home exercise program.  Will continue with current plan of care as tolerated.  Eval:Patient presents to physical therapy with complaints of chronic neck pain and migraines.  Patient demonstrates painful motions, reduced strength in upper extremities as well as postural deficits that are likely contributing to current presentation.  Session focused on education as well as developing home exercise program.  Tolerated all interventions well.  Patient would greatly benefit from skilled PT to reduce overall tension and develop program to help manage migraines and headaches without having to rely on rescue medication.  OBJECTIVE IMPAIRMENTS: decreased activity tolerance, decreased ROM, decreased strength, postural dysfunction, and pain.   ACTIVITY LIMITATIONS: carrying, lifting, and locomotion level  PARTICIPATION LIMITATIONS: cleaning, driving, and community activity  PERSONAL FACTORS: Fitness and 1 comorbidity: B frozen shoulder  are also affecting patient's functional outcome.   REHAB POTENTIAL: Good  CLINICAL DECISION MAKING: Stable/uncomplicated  EVALUATION COMPLEXITY: Low   GOALS: Goals reviewed with patient? yes  SHORT TERM GOALS: Target date: 03/19/2023   Patient will be independent in self management strategies to improve  quality of life and functional outcomes. Baseline: New Program Goal status: INITIAL  2.  Patient will report at least 50% improvement in overall symptoms and/or function to demonstrate improved functional mobility Baseline: 0% better Goal status: INITIAL  3.  Patient will demonstrate at least MMT in B UE to improve UE strength.  Baseline: see above Goal status: INITIAL       LONG TERM GOALS: Target date: 04/30/2023    Patient will report at least 75% improvement in overall symptoms and/or function to demonstrate improved functional mobility Baseline: 0% better Goal status: INITIAL  2.  Patient will demonstrate pain-free cervical range of motion in all directions. Baseline: Painful Goal status: INITIAL  3.  Patient will report for regularly performing interventions to help manage migraines and headaches to reduce migraine incidence Baseline: Not currently performing prevention interventions at this time Goal status: INITIAL      PLAN:  PT FREQUENCY: 1-2x/week for total of 12 visits over 12 week certification period  PT DURATION: 12 weeks  PLANNED INTERVENTIONS: Therapeutic exercises, Therapeutic activity, Neuromuscular re-education, Balance training, Gait training, Patient/Family education, Self Care, Joint mobilization, Joint manipulation, Stair training, Vestibular training, Canalith repositioning, Orthotic/Fit training, Prosthetic training, DME instructions, Aquatic Therapy, Dry Needling, Electrical stimulation, Spinal manipulation, Spinal mobilization,  Cryotherapy, Moist heat, Taping, Traction, Ultrasound, Ionotophoresis 4mg /ml Dexamethasone, Manual therapy, and Re-evaluation.   PLAN FOR NEXT SESSION: posture, breath work, chin tucks, thoracic mobility, scapular protraction. Manual and self STM   9:31 AM, 02/19/23 Tereasa Coop, DPT Physical Therapy with [facility]

## 2023-02-26 ENCOUNTER — Encounter: Payer: BC Managed Care – PPO | Admitting: Physical Therapy

## 2023-03-01 ENCOUNTER — Encounter: Payer: Self-pay | Admitting: Family Medicine

## 2023-03-01 ENCOUNTER — Ambulatory Visit (INDEPENDENT_AMBULATORY_CARE_PROVIDER_SITE_OTHER): Payer: BC Managed Care – PPO | Admitting: Family Medicine

## 2023-03-01 VITALS — BP 116/62 | HR 73 | Temp 98.4°F | Ht 65.0 in | Wt 130.8 lb

## 2023-03-01 DIAGNOSIS — J0100 Acute maxillary sinusitis, unspecified: Secondary | ICD-10-CM

## 2023-03-01 MED ORDER — PREDNISONE 20 MG PO TABS
40.0000 mg | ORAL_TABLET | Freq: Every day | ORAL | 0 refills | Status: AC
Start: 2023-03-01 — End: 2023-03-05

## 2023-03-01 MED ORDER — AMOXICILLIN-POT CLAVULANATE 875-125 MG PO TABS
1.0000 | ORAL_TABLET | Freq: Two times a day (BID) | ORAL | 0 refills | Status: AC
Start: 2023-03-01 — End: 2023-03-15

## 2023-03-01 NOTE — Progress Notes (Signed)
Acute Office Visit  Subjective:     Patient ID: Vicki [name], female    DOB: 07-06-1960, 62 y.o.   MRN: [id number]  Chief Complaint  Patient presents with   Cough    Productive with green-yellow sputum x9 days, tried Sudafed and Robitussin with no relief   Sore Throat    X9 days   Headache    X2 days, tried Tylenol and Aleve with some relief    HPI Patient is in today for 9 day history of sinus congestion, coughing, sore throat and left ear fullness. States that she did have some sick contacts at home. Reports that she has been taking OTC medications for her symptoms but they have persisted. States she took 2 COVID tests and they were both negative.   Review of Systems  All other systems reviewed and are negative.       Objective:    BP 116/62 (BP Location: Left Arm, Patient Position: Sitting, Cuff Size: Normal)   Pulse 73   Temp 98.4 F (36.9 C) (Oral)   Ht 5\' 5"  (1.651 m)   Wt 130 lb 12.8 oz (59.3 kg)   SpO2 96%   BMI 21.77 kg/m    Physical Exam Vitals reviewed.  Constitutional:      Appearance: She is well-developed and normal weight.  HENT:     Right Ear: Tympanic membrane normal.     Left Ear: A middle ear effusion is present.     Nose: Congestion present.     Mouth/Throat:     Mouth: Mucous membranes are moist.     Tonsils: No tonsillar exudate or tonsillar abscesses.  Cardiovascular:     Rate and Rhythm: Normal rate and regular rhythm.     Heart sounds: Normal heart sounds. No murmur heard. Pulmonary:     Effort: Pulmonary effort is normal.     Breath sounds: Normal breath sounds. No wheezing, rhonchi or rales.  Neurological:     Mental Status: She is alert.     No results found for any visits on 03/01/23.      Assessment & Plan:   Problem List Items Addressed This Visit   None Visit Diagnoses     Acute non-recurrent maxillary sinusitis    -  Primary   Relevant Medications   amoxicillin-clavulanate (AUGMENTIN) 875-125 MG tablet    predniSONE (DELTASONE) 20 MG tablet     Most likely acute sinusitis, will treat with augmentin and a short steroid burst due to the prolonged nature of her symptoms.   Meds ordered this encounter  Medications   amoxicillin-clavulanate (AUGMENTIN) 875-125 MG tablet    Sig: Take 1 tablet by mouth 2 (two) times daily for 14 days.    Dispense:  14 tablet    Refill:  0   predniSONE (DELTASONE) 20 MG tablet    Sig: Take 2 tablets (40 mg total) by mouth daily with breakfast for 4 days.    Dispense:  8 tablet    Refill:  0    No follow-ups on file.  Karie Georges, MD

## 2023-03-12 ENCOUNTER — Encounter: Payer: BC Managed Care – PPO | Admitting: Physical Therapy

## 2023-03-13 ENCOUNTER — Telehealth: Payer: Self-pay | Admitting: Physical Therapy

## 2023-03-13 NOTE — Telephone Encounter (Signed)
Called patient to talk about current PT POC. Patient getting over aggressive virus and also tending to some family matters. Patient would like to return to PT but can't for a couple of weeks. Placing pt on hold for 4 weeks and will f/u with patient if she does not return in that time frame.  9:27 AM, 03/13/23 Tereasa Coop, DPT Physical Therapy with [facility]

## 2023-03-25 ENCOUNTER — Encounter: Payer: BC Managed Care – PPO | Admitting: Physical Therapy

## 2023-04-09 ENCOUNTER — Encounter: Payer: BC Managed Care – PPO | Admitting: Physical Therapy

## 2023-04-09 DIAGNOSIS — Z1212 Encounter for screening for malignant neoplasm of rectum: Secondary | ICD-10-CM | POA: Diagnosis not present

## 2023-04-09 DIAGNOSIS — Z01419 Encounter for gynecological examination (general) (routine) without abnormal findings: Secondary | ICD-10-CM | POA: Diagnosis not present

## 2023-04-09 DIAGNOSIS — Z780 Asymptomatic menopausal state: Secondary | ICD-10-CM | POA: Diagnosis not present

## 2023-04-09 DIAGNOSIS — N958 Other specified menopausal and perimenopausal disorders: Secondary | ICD-10-CM | POA: Diagnosis not present

## 2023-05-06 ENCOUNTER — Encounter: Payer: Self-pay | Admitting: Physical Therapy

## 2023-05-06 ENCOUNTER — Ambulatory Visit (INDEPENDENT_AMBULATORY_CARE_PROVIDER_SITE_OTHER): Payer: BC Managed Care – PPO | Admitting: Physical Therapy

## 2023-05-06 DIAGNOSIS — G44221 Chronic tension-type headache, intractable: Secondary | ICD-10-CM

## 2023-05-06 DIAGNOSIS — M542 Cervicalgia: Secondary | ICD-10-CM | POA: Diagnosis not present

## 2023-05-06 DIAGNOSIS — M6281 Muscle weakness (generalized): Secondary | ICD-10-CM | POA: Diagnosis not present

## 2023-05-06 NOTE — Therapy (Addendum)
 OUTPATIENT PHYSICAL THERAPY CERVICAL TREATMENT AND RECERT  PHYSICAL THERAPY DISCHARGE SUMMARY  Visits from Start of Care: 3  Current functional level related to goals / functional outcomes: Could not reassess due to unplanned discharged   Remaining deficits: Could not reassess due to unplanned discharged    Education / Equipment: Could not reassess due to unplanned discharged   Patient agrees to discharge. Patient goals were partially met. Patient is being discharged due to not returning since the last visit.  2:39 PM, 08/20/23 Tereasa Coop, DPT Physical Therapy with [place]   Patient Name: Vicki [name] MRN: [id number] DOB:1960/12/28, 62 y.o., female Today's Date: 05/06/2023  END OF SESSION:  PT End of Session - 05/06/23 0929     Visit Number 3    Number of Visits 9    Date for PT Re-Evaluation 07/29/23    Authorization Type BCBS, VL 30 - 8 used- 22 left    Authorization - Visit Number 3    Authorization - Number of Visits 22    PT Start Time 0933    PT Stop Time 1014    PT Time Calculation (min) 41 min    Activity Tolerance Patient tolerated treatment well    Behavior During Therapy WFL for tasks assessed/performed             Past Medical History:  Diagnosis Date   Allergy    Anxiety    hx ?panic disorder   Asthma    Celiac disease    Chronic gastritis    GERD (gastroesophageal reflux disease)    Hyperplastic colon polyp    Migraines    chronic   Schatzki's ring    Skin cancer    chin area   Thyroid disease    Past Surgical History:  Procedure Laterality Date   ABLATION  2012   CESAREAN SECTION  1984   COLONOSCOPY  2011   DILATION AND CURETTAGE OF UTERUS  2012   POLYPECTOMY     TONSILLECTOMY AND ADENOIDECTOMY  1964   TUBAL LIGATION  1998   UPPER GASTROINTESTINAL ENDOSCOPY  2011,1998   Patient Active Problem List   Diagnosis Date Noted   Arthritis of hand 08/12/2021   Acquired trigger finger of left ring finger 08/10/2021    Dupuytren's disease of finger 08/10/2021   Chronic migraine without aura without status migrainosus, not intractable 02/19/2019   Irritable bowel disease 01/14/2018   Gastroesophageal reflux disease without esophagitis 11/09/2015   Belching 11/09/2015   Loose stools 11/09/2015   History of migraine headaches 01/09/2013   Asthma, mild intermittent 01/31/2012   GERD (gastroesophageal reflux disease) 01/31/2012   Celiac disease 01/31/2012   Panic disorder 01/31/2012    PCP: Kristian Covey, MD  REFERRING PROVIDER: Kristian Covey, MD  REFERRING DIAG: G43.709 (ICD-10-CM) - Chronic migraine without aura without status migrainosus, not intractable  THERAPY DIAG:  Cervicalgia  Muscle weakness (generalized)  Chronic tension-type headache, intractable  Rationale for Evaluation and Treatment: Rehabilitation  ONSET DATE: Chronic recent onset migraine within last month  SUBJECTIVE:  SUBJECTIVE STATEMENT: 05/06/2023 States she wore flats over the weekend and her left heel is sore but it was better. States that driving makes her anxiety and therefore her migraines worse. Overall her breathing and exercises have been helping. 50% better since the start of PT.  EVAL: States she had a recent headache that lasted for 5 days. States she started Chatom  and it worked so much better and had no migraine hangover. States usually she gets migraines 1-2x/month and notes it can come on with fatigue or stress. Reports she doesn't always get an aura. States that skipping meals or traveling can bring on headaches/migraines. States she has not noticed any food correlations at this time.    States she carries stress and tension in shoulders and neck.  Patient is right handed  PERTINENT HISTORY:   Migraines, B frozen shoulder, hairline fracture history in left humerus  PAIN:  Are you having pain? Yes: NPRS scale: 2/10 Pain location: left head/ shoulder Pain description: tension, tightness Aggravating factors: fatigue, stress, skipping meals Relieving factors: dark room, quiet, going bed, medication  PRECAUTIONS: None  RED FLAGS: None     WEIGHT BEARING RESTRICTIONS: No  FALLS:  Has patient fallen in last 6 months? No   OCCUPATION: retired, walks 2 labs regularly  PLOF: Independent  PATIENT GOALS: to manage migraines better    OBJECTIVE:   DIAGNOSTIC FINDINGS:  No imaging at this time   COGNITION: Overall cognitive status: Within functional limits for tasks assessed  SENSATION: Not tested  POSTURE: rounded shoulders, forward head, and flexed trunk    CERVICAL ROM:   Active ROM A/PROM (deg) 12/9  Flexion [facility]  Extension WFL  Right lateral flexion   Left lateral flexion   Right rotation 20  Left rotation 12*   (Blank rows = not tested) *tightness    UE Measurements Upper Extremity Right 05/06/23 Left 05/06/23   A/PROM MMT A/PROM MMT  Shoulder Flexion WFL 3+ WFL 3+  Shoulder Extension      Shoulder Abduction WFL 3+ WFL 3+  Shoulder Adduction      Shoulder Internal Rotation Reaches to T12 SP* 3+ Reaches to T9 SP* 3+  Shoulder External Rotation Reaches to T1 SP* 3+ Reaches to T1 SP 3+  Elbow Flexion      Elbow Extension      Wrist Flexion      Wrist Extension      Wrist Supination      Wrist Pronation      Wrist Ulnar Deviation      Wrist Radial Deviation      Grip Strength NA  NA     (Blank rows = not tested)   * tension Jefm Petty     TODAY'S TREATMENT:                                                                                                                              DATE:   05/06/2023  Therapeutic Exercise:  Review of HEP, POC and progress made, objective measurements updated Supine:   Prone: over ball breathing  with scap protraction 6 minutes, kneeling over large ball with scap protraction 6 minutes  Quad: over ball with good posture chin tuck holds 3x4 5" holds  Seated: hugging  ball scap protraction breathing tall - 6 minutes  Standing: Neuromuscular Re-education:  Manual Therapy: Therapeutic Activity: Self Care: Trigger Point Dry Needling:  Modalities:    PATIENT EDUCATION:  Education details: on HEP, on anatomy and rationale behind each exercise, on POC and objective measurements  Person educated: Patient Education method: Explanation, Demonstration, and Handouts Education comprehension: verbalized understanding   HOME EXERCISE PROGRAM: [id number]  - previous [facility] from foot rehab [id number]  ASSESSMENT:  CLINICAL IMPRESSION: 05/06/2023 Session focused on review of HEP, answering all questions and updating goals Patient with a lot of family responsibilities at this time and could not regularly attend PT these last few months. Session focused on updating HEP and extending POC to work on continuing rehab of cervical spine. Answered all questions and patient will greatly benefit from skilled PT to improve overall function and QOL.   Eval:Patient presents to physical therapy with complaints of chronic neck pain and migraines.  Patient demonstrates painful motions, reduced strength in upper extremities as well as postural deficits that are likely contributing to current presentation.  Session focused on education as well as developing home exercise program.  Tolerated all interventions well.  Patient would greatly benefit from skilled PT to reduce overall tension and develop program to help manage migraines and headaches without having to rely on rescue medication.  OBJECTIVE IMPAIRMENTS: decreased activity tolerance, decreased ROM, decreased strength, postural dysfunction, and pain.   ACTIVITY LIMITATIONS: carrying, lifting, and locomotion level  PARTICIPATION LIMITATIONS: cleaning,  driving, and community activity  PERSONAL FACTORS: Fitness and 1 comorbidity: B frozen shoulder  are also affecting patient's functional outcome.   REHAB POTENTIAL: Good  CLINICAL DECISION MAKING: Stable/uncomplicated  EVALUATION COMPLEXITY: Low   GOALS: Goals reviewed with patient? yes  SHORT TERM GOALS: Target date: 06/19/2023  Patient will be independent in self management strategies to improve quality of life and functional outcomes. Baseline: New Program Goal status: PROGRESSING  2.  Patient will report at least 50% improvement in overall symptoms and/or function to demonstrate improved functional mobility Baseline: 0% better Goal status: MET  3.  Patient will demonstrate at least 4/5 MMT in B UE to improve UE strength.  Baseline: see above Goal status: PROGRESSING       LONG TERM GOALS: Target date: 07/29/2023   Patient will report at least 75% improvement in overall symptoms and/or function to demonstrate improved functional mobility Baseline: 0% better Goal status: PROGRESSING  2.  Patient will demonstrate pain-free cervical range of motion in all directions. Baseline: Painful Goal status: PROGRESSING  3.  Patient will report for regularly performing interventions to help manage migraines and headaches to reduce migraine incidence Baseline: Not currently performing prevention interventions at this time Goal status: PROGRESSING      PLAN:  PT FREQUENCY: 1x every other week for total of 6 visits  over 12 week certification period  PT DURATION: 12 weeks  PLANNED INTERVENTIONS: Therapeutic exercises, Therapeutic activity, Neuromuscular re-education, Balance training, Gait training, Patient/Family education, Self Care, Joint mobilization, Joint manipulation, Stair training, Vestibular training, Canalith repositioning, Orthotic/Fit training, Prosthetic training, DME instructions, Aquatic Therapy, Dry Needling, Electrical stimulation, Spinal manipulation, Spinal  mobilization, Cryotherapy, Moist heat, Taping, Traction, Ultrasound, Ionotophoresis 4mg /ml  Dexamethasone, Manual therapy, and Re-evaluation.   PLAN FOR NEXT SESSION: stretch/strengthening   10:43 AM, 05/06/23 Tereasa Coop, DPT Physical Therapy with [facility]

## 2023-05-08 ENCOUNTER — Telehealth: Payer: Self-pay

## 2023-05-08 NOTE — Telephone Encounter (Signed)
*  Primary  Pharmacy Patient Advocate Encounter   Received notification from CoverMyMeds that prior authorization for Ubrelvy 100MG  tablets  is required/requested.   Insurance verification completed.   The patient is insured through [facility] .   Per test claim: PA required; PA submitted to above mentioned insurance via CoverMyMeds Key/confirmation #/EOC [id number] Status is pending

## 2023-05-09 NOTE — Telephone Encounter (Signed)
Pharmacy Patient Advocate Encounter  Received notification from [facility] that Prior Authorization for  Ubrelvy 100MG  tablets  has been APPROVED from 05/08/23 to 12/11/5   PA #/Case ID/Reference #: [id number]

## 2023-05-09 NOTE — Telephone Encounter (Signed)
Spoke with Marcelino Duster at [facility] and informed her of the approval as below.

## 2023-05-13 ENCOUNTER — Ambulatory Visit (INDEPENDENT_AMBULATORY_CARE_PROVIDER_SITE_OTHER): Payer: BC Managed Care – PPO | Admitting: Family Medicine

## 2023-05-13 ENCOUNTER — Encounter: Payer: Self-pay | Admitting: Family Medicine

## 2023-05-13 VITALS — BP 130/64 | HR 75 | Temp 97.7°F | Ht 65.0 in | Wt 129.8 lb

## 2023-05-13 DIAGNOSIS — R0989 Other specified symptoms and signs involving the circulatory and respiratory systems: Secondary | ICD-10-CM | POA: Diagnosis not present

## 2023-05-13 DIAGNOSIS — R42 Dizziness and giddiness: Secondary | ICD-10-CM | POA: Diagnosis not present

## 2023-05-13 MED ORDER — MECLIZINE HCL 25 MG PO TABS: 25.0000 mg | ORAL_TABLET | Freq: Three times a day (TID) | ORAL | 0 refills | Status: AC | PRN

## 2023-05-13 NOTE — Progress Notes (Signed)
Established Patient Office Visit  Subjective   Patient ID: Vicki [name], female    DOB: Oct 26, 1960  Age: 62 y.o. MRN: [id number]  Chief Complaint  Patient presents with   Ear Problem    Patient complains of left ear problem, x1 week    Dizziness    Patient complains of dizziness, x6 days    Nausea    Patient complains of nausea, x1 day   Nasal Congestion    Patient complains of nasal congestion, Tried Sudafed    HPI   Vicki [name] is seen today with left ear symptoms.  About a week ago she noticed a "popping "sensation since that time she has had some dizziness.  She has had some associated nausea.  Took some Sudafed without relief.  Denies any cold symptoms such as sore throat, cough, fever.  Has had some nasal congestion.  She had a little bit of pain down below her left ear region.  Past couple days particularly has noticed some intermittent vertigo type symptoms especially when bending over and sitting back up.  Denies any hearing changes, ataxia, speech changes, blurred vision, or any focal weakness.  No confusion.  No prior history of vertigo.  Non-smoker.  No history of diabetes.  Previous lipids have been fairly favorable with exception of minimally elevated LDL cholesterol.  No history of hypertension.  Past Medical History:  Diagnosis Date   Allergy    Anxiety    hx ?panic disorder   Asthma    Celiac disease    Chronic gastritis    GERD (gastroesophageal reflux disease)    Hyperplastic colon polyp    Migraines    chronic   Schatzki's ring    Skin cancer    chin area   Thyroid disease    Past Surgical History:  Procedure Laterality Date   ABLATION  2012   CESAREAN SECTION  1984   COLONOSCOPY  2011   DILATION AND CURETTAGE OF UTERUS  2012   POLYPECTOMY     TONSILLECTOMY AND ADENOIDECTOMY  1964   TUBAL LIGATION  1998   UPPER GASTROINTESTINAL ENDOSCOPY  2011,1998    reports that she has never smoked. She has never used smokeless tobacco. She reports that she  does not drink alcohol and does not use drugs. family history includes Alcohol abuse in her father and mother; Arthritis in her maternal grandmother; Asthma in her paternal aunt; Breast cancer in her mother; Breast cancer (age of onset: 60) in her sister; Colon cancer in her paternal aunt and paternal grandfather; Irritable bowel syndrome in her sister; Lupus in her sister; Stroke in her father. Allergies  Allergen Reactions   Lamisil [Terbinafine] Hives   Wheat     Other reaction(s): Respiratory Distress   Ceclor [Cefaclor]     hives   Cephalosporins    Latex    Zoster Vaccine Live Rash    Review of Systems  Constitutional:  Negative for chills and fever.  HENT:  Positive for congestion. Negative for sore throat.   Eyes:  Negative for blurred vision and double vision.  Respiratory:  Negative for cough.   Cardiovascular:  Negative for chest pain.  Gastrointestinal:  Positive for nausea. Negative for abdominal pain and vomiting.  Genitourinary:  Negative for dysuria.  Neurological:  Positive for dizziness. Negative for focal weakness and headaches.      Objective:     BP 130/64 (BP Location: Left Arm, Patient Position: Sitting, Cuff Size: Normal)   Pulse 75  Temp 97.7 F (36.5 C) (Oral)   Ht 5\' 5"  (1.651 m)   Wt 129 lb 12.8 oz (58.9 kg)   SpO2 97%   BMI 21.60 kg/m  BP Readings from Last 3 Encounters:  05/13/23 130/64  03/01/23 116/62  01/15/23 108/60   Wt Readings from Last 3 Encounters:  05/13/23 129 lb 12.8 oz (58.9 kg)  03/01/23 130 lb 12.8 oz (59.3 kg)  01/15/23 129 lb 8 oz (58.7 kg)      Physical Exam Vitals reviewed.  Constitutional:      General: She is not in acute distress.    Appearance: She is not ill-appearing.  HENT:     Right Ear: Tympanic membrane normal.     Left Ear: Tympanic membrane normal.  Eyes:     Extraocular Movements: Extraocular movements intact.     Pupils: Pupils are equal, round, and reactive to light.  Neck:     Comments:  Does have a fairly high pitched bruit noted right upper carotid region.  None noted on the left. Cardiovascular:     Rate and Rhythm: Normal rate and regular rhythm.     Heart sounds: No murmur heard. Pulmonary:     Effort: Pulmonary effort is normal.     Breath sounds: Normal breath sounds. No wheezing or rales.  Musculoskeletal:     Cervical back: Neck supple.  Lymphadenopathy:     Cervical: No cervical adenopathy.  Neurological:     General: No focal deficit present.     Mental Status: She is alert and oriented to person, place, and time.     Cranial Nerves: No cranial nerve deficit.     Motor: No weakness.     Coordination: Coordination normal.     Comments: Does have vertigo symptoms noted when going from sitting to supine with head turned 45 degrees to the right.    no horizontal or vertical nystagmus noted at this time      No results found for any visits on 05/13/23.  Last CBC Lab Results  Component Value Date   WBC 5.5 01/15/2023   HGB 12.0 01/15/2023   HCT 36.5 01/15/2023   MCV 95.8 01/15/2023   RDW 13.8 01/15/2023   PLT 211.0 01/15/2023   Last metabolic panel Lab Results  Component Value Date   GLUCOSE 93 01/15/2023   NA 141 01/15/2023   K 4.1 01/15/2023   CL 104 01/15/2023   CO2 30 01/15/2023   BUN 10 01/15/2023   CREATININE 0.78 01/15/2023   GFR 80.79 01/15/2023   CALCIUM 9.6 01/15/2023   PROT 6.7 01/15/2023   ALBUMIN 4.3 01/15/2023   BILITOT 0.5 01/15/2023   ALKPHOS 47 01/15/2023   AST 24 01/15/2023   ALT 19 01/15/2023   Last lipids Lab Results  Component Value Date   CHOL 187 10/03/2020   HDL 72.50 10/03/2020   LDLCALC 102 (H) 10/03/2020   TRIG 65.0 10/03/2020   CHOLHDL 3 10/03/2020      The 19-JYNW ASCVD risk score (Arnett DK, et al., 2019) is: 3.9%    Assessment & Plan:   #1 few day history of intermittent vertigo.  This appears to be vertigo triggered with head turn to the right.  Does not have any red flags such as speech change,  focal weakness, ataxia, visual change, or swallowing difficulties.  Symptoms are triggered with head movement. -Meclizine 25 mg every 8 hours as needed for nausea.  She is aware this could cause some sedation.  Avoid driving. -  Handout on Epley maneuvers given. -Follow-up immediately for any persistent or worsening symptoms -Be in touch if vertigo symptoms not resolving in the next week with Epley maneuvers.  #2 right carotid bruit of uncertain significance.  Does not have any classic recent TIA symptoms.  Obtain carotid Dopplers to further assess   No follow-ups on file.    Evelena Peat, MD

## 2023-05-13 NOTE — Patient Instructions (Signed)
We have ordered carotid doppler to assess right carotid bruit.

## 2023-05-18 ENCOUNTER — Ambulatory Visit (HOSPITAL_BASED_OUTPATIENT_CLINIC_OR_DEPARTMENT_OTHER)
Admission: RE | Admit: 2023-05-18 | Discharge: 2023-05-18 | Disposition: A | Discharge status: Home / Self Care | Payer: BC Managed Care – PPO | Source: Ambulatory Visit | Attending: Family Medicine | Admitting: Family Medicine

## 2023-05-18 DIAGNOSIS — R0989 Other specified symptoms and signs involving the circulatory and respiratory systems: Secondary | ICD-10-CM | POA: Diagnosis not present

## 2023-05-24 ENCOUNTER — Encounter: Payer: Self-pay | Admitting: Family Medicine

## 2023-05-24 ENCOUNTER — Ambulatory Visit: Payer: Self-pay | Admitting: Family Medicine

## 2023-05-24 NOTE — Telephone Encounter (Signed)
Spoke to pt. Inform pt she would need a visit in order for Rx to be prescribed. Pt states " you must be kidding me". Pt ask if there anyone she can do virtual visit. Inform pt, we don't have any open slot. But she can do evisit with another provider. Pt states she had tried that and it was not available. Inform pt. Other option is to head to urgent care. Pt states " I guess I will go to urgent care and infects every body over there." Pt hang up the call.

## 2023-05-24 NOTE — Telephone Encounter (Signed)
Copied from CRM [id number]. Topic: Clinical - Red Word Triage >> May 24, 2023  3:22 PM Kathryne Eriksson wrote: Red Word that prompted transfer to Nurse Triage: Flu Strain A... Headache, FEVER, Runny Nose..   Chief Complaint: Flu A positive Symptoms: "splitting headache," fever, chills, cough, congestion, and body aches Frequency: continual Pertinent Negatives: Patient denies SOB, chest pain Disposition: [] ED /[x] Urgent Care (no appt availability in office) / [] Appointment(In office/virtual)/ []  [facility]/ [] Home Care/ [] Refused Recommended Disposition /[] [place] [facility]/ []  Follow-up with PCP Additional Notes: Pt reporting that she took an at-home "3-strain" test that includes covid and flu, tested positive for flu A, pt requesting script for tamiflu since still within 48-hour window of symptom onset. Pt reporting that she received a call back from Dr. Lucie Leather office while waiting for transfer to this nurse from [facility] agent so pt took that call. Pt reporting that CMA stated there were no appts available today and that Dr. Caryl Never was not in today, CMA told pt that she would need to come in for appt or go to UC for tamiflu script. Pt reporting that she is "too sick to come in" for appt anywhere. Pt reporting she has a "splitting headache," fever, chills, cough, congestion, and body aches. Pt confirms no SOB or chest pain at this time. Offered to schedule her a virtual UC appt to see if they would be willing to prescribe, soonest virtual appt available is tomorrow around noon. Pt declines at this time, pt stating she will try to schedule virtual appt with [facility]. Pt verbalized understanding to call if worsening symptoms or other needs.  Reason for Disposition  Caller has already spoken with another triager or PCP AND has further questions AND triager able to answer questions.  Answer Assessment - Initial Assessment Questions 1. REASON FOR CALL or QUESTION: "What is your reason  for calling today?" or "How can I best help you?" or "What question do you have that I can help answer?"    Pt reporting that she took an at-home "3-strain" test that includes covid and flu, tested positive for flu A, pt requesting script for tamiflu since still within 48-hour window of symptom onset. Pt reporting that she received a call back from Dr. Lucie Leather office while waiting for transfer to this nurse from [facility] agent so pt took that call. Pt reporting that office stated there were no appts available today and that Dr. Caryl Never was not in today, office told pt that she would need to come in for appt or go to UC for tamiflu script. Pt reporting that she is "too sick to come in" for appt anywhere. Pt reporting she has a "splitting headache," fever, chills, cough, congestion, and body aches. Pt confirms no SOB or chest pain at this time. Offered to schedule her a virtual UC appt to see if they would be willing to prescribe, soonest virtual appt available is tomorrow around noon. Pt declines at this time, pt stating she will try to schedule [facility] appt with [facility]. Pt verbalized understanding to call if worsening symptoms or other needs.  Protocols used: Information Only Call - No Triage-A-AH, No Contact or Duplicate Contact Call-A-AH

## 2023-05-27 ENCOUNTER — Telehealth: Payer: Self-pay

## 2023-05-27 NOTE — Telephone Encounter (Signed)
Spoke with patient.  Recent positive influenza A test.  She had hoped to get Tamiflu last week.  She knows it is too late to use Tamiflu at this point.  She does feel stable overall.  No dyspnea.  Be in touch if fever not resolving next couple days  Vicki Covey MD [facility]

## 2023-05-27 NOTE — Telephone Encounter (Signed)
Please see encounter from 05/24/23 patient was advised to proceed to UC.

## 2023-06-04 ENCOUNTER — Ambulatory Visit: Payer: Self-pay | Admitting: Family Medicine

## 2023-06-04 ENCOUNTER — Ambulatory Visit (INDEPENDENT_AMBULATORY_CARE_PROVIDER_SITE_OTHER): Payer: BC Managed Care – PPO | Admitting: Family Medicine

## 2023-06-04 VITALS — BP 136/70 | HR 70 | Temp 98.7°F | Wt 127.0 lb

## 2023-06-04 DIAGNOSIS — R051 Acute cough: Secondary | ICD-10-CM | POA: Diagnosis not present

## 2023-06-04 DIAGNOSIS — R062 Wheezing: Secondary | ICD-10-CM | POA: Diagnosis not present

## 2023-06-04 MED ORDER — METHYLPREDNISOLONE ACETATE 80 MG/ML IJ SUSP
80.0000 mg | Freq: Once | INTRAMUSCULAR | Status: AC
Start: 2023-06-04 — End: 2023-06-04
  Administered 2023-06-04: 80 mg via INTRAMUSCULAR

## 2023-06-04 MED ORDER — ALBUTEROL SULFATE HFA 108 (90 BASE) MCG/ACT IN AERS: 2.0000 | INHALATION_SPRAY | RESPIRATORY_TRACT | 1 refills | Status: AC | PRN

## 2023-06-04 NOTE — Addendum Note (Signed)
 Addended by: Christy Sartorius on: 06/04/2023 04:16 PM   Modules accepted: Orders

## 2023-06-04 NOTE — Telephone Encounter (Signed)
 Chief Complaint: cough Symptoms: productive at times, tightness, mild SOB when walking,  Frequency: comes and goes  Disposition: [] ED /[] Urgent Care (no appt availability in office) / [x] Appointment(In office/virtual)/ []  [facility]/ [] Home Care/ [] Refused Recommended Disposition /[] [facility]/ []  Follow-up with PCP Additional Notes: Pt had a positive  flu test done at home on 12/25. No Tamiflu given due to time. Pt let Dr Micheal know and he stated if her symptoms worsen to call back. Pt stated her cough has gotten worse. It feels deep in my chest like pneumonia/bronchitis. It's like a snoring sound. It's productive with yellowish sputum. Pt had fever 12/25-12/30, but nothing since. Pt complains of mild SOB when she walks her dogs. Per protocol, pt to be seen within 4 hours. Pt has earliest appt for today at 1430 with Dr Micheal. Pt verbalized all care advice and plans to attend appt.              Copied from CRM [id number]. Topic: Clinical - Pink Word Triage >> Jun 04, 2023  7:56 AM Eleanor C wrote: Reason for Triage: patient had an appointment with MD 2 weeks ago and tested positive for flu. At that time her MD told her to come back in if symptoms got worse- her cough has gotten much worse and she cannot get into the practice until tomorrow. She made the appointment but would like to speak with a nurse in the meantime Reason for Disposition  [1] MILD difficulty breathing (e.g., minimal/no SOB at rest, SOB with walking, pulse <100) AND [2] still present when not coughing  Answer Assessment - Initial Assessment Questions 1. ONSET: When did the cough begin?      05/22/23 2. SEVERITY: How bad is the cough today?      Deep chest cough/productive 3. SPUTUM: Describe the color of your sputum (none, dry cough; clear, white, yellow, green)     yellowish 4. HEMOPTYSIS: Are you coughing up any blood? If so ask: How much? (flecks, streaks, tablespoons,  etc.)     no 5. DIFFICULTY BREATHING: Are you having difficulty breathing? If Yes, ask: How bad is it? (e.g., mild, moderate, severe)    - MILD: No SOB at rest, mild SOB with walking, speaks normally in sentences, can lie down, no retractions, pulse < 100.    - MODERATE: SOB at rest, SOB with minimal exertion and prefers to sit, cannot lie down flat, speaks in phrases, mild retractions, audible wheezing, pulse 100-120.    - SEVERE: Very SOB at rest, speaks in single words, struggling to breathe, sitting hunched forward, retractions, pulse > 120      Mild when walking dogs 6. FEVER: Do you have a fever? If Yes, ask: What is your temperature, how was it measured, and when did it start?    Nothing recently  7. CARDIAC HISTORY: Do you have any history of heart disease? (e.g., heart attack, congestive heart failure)      no 8. LUNG HISTORY: Do you have any history of lung disease?  (e.g., pulmonary embolus, asthma, emphysema)     asthma 9. PE RISK FACTORS: Do you have a history of blood clots? (or: recent major surgery, recent prolonged travel, bedridden)     no 10. OTHER SYMPTOMS: Do you have any other symptoms? (e.g., runny nose, wheezing, chest pain)       Snoring in chest ,sinus drainage, chest tightness, sometimes rattle  12. TRAVEL: Have you traveled out of the country in the  last month? (e.g., travel history, exposures)       no  Protocols used: Cough - Acute Productive-A-AH

## 2023-06-04 NOTE — Patient Instructions (Signed)
 Follow up for any fever or increased shortness of breath.

## 2023-06-04 NOTE — Progress Notes (Signed)
 Established Patient Office Visit  Subjective   Patient ID: Vicki [name], female    DOB: Nov 28, 1960  Age: 63 y.o. MRN: [id number]  Chief Complaint  Patient presents with   Cough    Patient complains of cough, x2 weeks, Non productive, Tried Robitussin   Shortness of Breath         HPI   Vicki [name] had influenza a couple weeks ago.  This was determined from positive home test.  Unfortunately, we did not get Tamiflu called and within 48-hour window.  She did have fever for about 5 days but has had no fever since then.  She does have some persistent cough and shortness of breath with exertion and wheezing.  Cough was productive yesterday and more nonproductive today.  Taken some over-the-counter Robitussin.  No history of chronic lung issues.  Non-smoker.  Denies any nausea, vomiting, or diarrhea.  No chest pain.  She has albuterol  inhaler at home but thinks this is probably out of date  Past Medical History:  Diagnosis Date   Allergy    Anxiety    hx ?panic disorder   Asthma    Celiac disease    Chronic gastritis    GERD (gastroesophageal reflux disease)    Hyperplastic colon polyp    Migraines    chronic   Schatzki's ring    Skin cancer    chin area   Thyroid  disease    Past Surgical History:  Procedure Laterality Date   ABLATION  2012   CESAREAN SECTION  1984   COLONOSCOPY  2011   DILATION AND CURETTAGE OF UTERUS  2012   POLYPECTOMY     TONSILLECTOMY AND ADENOIDECTOMY  1964   TUBAL LIGATION  1998   UPPER GASTROINTESTINAL ENDOSCOPY  2011,1998    reports that she has never smoked. She has never used smokeless tobacco. She reports that she does not drink alcohol and does not use drugs. family history includes Alcohol abuse in her father and mother; Arthritis in her maternal grandmother; Asthma in her paternal aunt; Breast cancer in her mother; Breast cancer (age of onset: 77) in her sister; Colon cancer in her paternal aunt and paternal grandfather; Irritable bowel  syndrome in her sister; Lupus in her sister; Stroke in her father. Allergies  Allergen Reactions   Lamisil [Terbinafine] Hives   Wheat     Other reaction(s): Respiratory Distress   Ceclor [Cefaclor]     hives   Cephalosporins    Latex    Zoster Vaccine Live Rash    Review of Systems  Constitutional:  Negative for chills and fever.  Respiratory:  Positive for cough, shortness of breath and wheezing. Negative for hemoptysis.   Cardiovascular:  Negative for chest pain.      Objective:     BP 136/70 (BP Location: Left Arm, Patient Position: Sitting, Cuff Size: Normal)   Pulse 70   Temp 98.7 F (37.1 C) (Oral)   Wt 127 lb (57.6 kg)   SpO2 100%   BMI 21.13 kg/m  BP Readings from Last 3 Encounters:  06/04/23 136/70  05/13/23 130/64  03/01/23 116/62   Wt Readings from Last 3 Encounters:  06/04/23 127 lb (57.6 kg)  05/13/23 129 lb 12.8 oz (58.9 kg)  03/01/23 130 lb 12.8 oz (59.3 kg)      Physical Exam Vitals reviewed.  Constitutional:      General: She is not in acute distress.    Appearance: She is not ill-appearing.  Pulmonary:  Comments: Diffuse expiratory wheezing posteriorly.  None noted anterior chest.  No rales.  No retractions.  Pulse oximetry 100% O2 Neurological:     Mental Status: She is alert.      No results found for any visits on 06/04/23.    The 10-year ASCVD risk score (Arnett DK, et al., 2019) is: 4.2%    Assessment & Plan:   Recent influenza infection.  Patient now presents with persistent cough with reactive airway changes on exam but no respiratory distress.  Does not have any red flags such as fever or rales on exam.  -We discussed steroids-- oral versus IM.  She prefers IM.  Depo-Medrol  80 mg given. -Refill albuterol  inhaler to use 2 puffs every 4-6 hours as needed -Follow-up promptly for any fever or increased shortness of breath.  Wolm Scarlet, MD

## 2023-06-05 ENCOUNTER — Ambulatory Visit: Payer: BC Managed Care – PPO | Admitting: Family Medicine

## 2023-07-03 DIAGNOSIS — D225 Melanocytic nevi of trunk: Secondary | ICD-10-CM | POA: Diagnosis not present

## 2023-07-03 DIAGNOSIS — Z08 Encounter for follow-up examination after completed treatment for malignant neoplasm: Secondary | ICD-10-CM | POA: Diagnosis not present

## 2023-07-03 DIAGNOSIS — L853 Xerosis cutis: Secondary | ICD-10-CM | POA: Diagnosis not present

## 2023-07-03 DIAGNOSIS — L84 Corns and callosities: Secondary | ICD-10-CM | POA: Diagnosis not present

## 2023-07-13 ENCOUNTER — Other Ambulatory Visit: Payer: Self-pay | Admitting: Family Medicine

## 2023-07-15 ENCOUNTER — Encounter: Payer: Self-pay | Admitting: Family Medicine

## 2023-07-15 ENCOUNTER — Ambulatory Visit (INDEPENDENT_AMBULATORY_CARE_PROVIDER_SITE_OTHER): Payer: BC Managed Care – PPO | Admitting: Family Medicine

## 2023-07-15 ENCOUNTER — Ambulatory Visit: Payer: BC Managed Care – PPO

## 2023-07-15 VITALS — BP 128/66 | HR 77 | Temp 98.6°F | Wt 126.2 lb

## 2023-07-15 DIAGNOSIS — R062 Wheezing: Secondary | ICD-10-CM | POA: Diagnosis not present

## 2023-07-15 DIAGNOSIS — R053 Chronic cough: Secondary | ICD-10-CM | POA: Diagnosis not present

## 2023-07-15 DIAGNOSIS — R059 Cough, unspecified: Secondary | ICD-10-CM | POA: Diagnosis not present

## 2023-07-15 MED ORDER — BUDESONIDE-FORMOTEROL FUMARATE 80-4.5 MCG/ACT IN AERO: 2.0000 | INHALATION_SPRAY | Freq: Two times a day (BID) | RESPIRATORY_TRACT | 3 refills | Status: DC

## 2023-07-15 NOTE — Patient Instructions (Signed)
 Start the Symbicort inhaler two puffs every 12 hours and rinse mouth after use.  Let me know in two weeks if cough and wheezing not setting down.

## 2023-07-15 NOTE — Progress Notes (Signed)
 Established Patient Office Visit  Subjective   Patient ID: Vicki [name], female    DOB: January 26, 1961  Age: 63 y.o. MRN: [id number]  Chief Complaint  Patient presents with   Shortness of Breath   Cough    HPI   Vicki [name] is seen with some intermittent shortness of breath and cough since she had influenza back in late December.  She was diagnosed with influenza around December 26.  She came in in early January and had some noted wheezing on exam and was given Depo-Medrol which did help for about 2 weeks.  She does have history of asthma.  She has had some intermittent wheezing and mostly dry cough since visit in January.  Never smoked.  Denies any hemoptysis.  No fevers or chills.  Her wheezing and cough have limited her activities somewhat but she still walking some.  She feels like cold air tends to trigger coughing at times.  No other environmental changes.  She does have a rescue inhaler with albuterol and has been using this much more frequently in recent weeks She has has very modest weight loss of a few pounds  Past Medical History:  Diagnosis Date   Allergy    Anxiety    hx ?panic disorder   Asthma    Celiac disease    Chronic gastritis    GERD (gastroesophageal reflux disease)    Hyperplastic colon polyp    Migraines    chronic   Schatzki's ring    Skin cancer    chin area   Thyroid disease    Past Surgical History:  Procedure Laterality Date   ABLATION  2012   CESAREAN SECTION  1984   COLONOSCOPY  2011   DILATION AND CURETTAGE OF UTERUS  2012   POLYPECTOMY     TONSILLECTOMY AND ADENOIDECTOMY  1964   TUBAL LIGATION  1998   UPPER GASTROINTESTINAL ENDOSCOPY  2011,1998    reports that she has never smoked. She has never used smokeless tobacco. She reports that she does not drink alcohol and does not use drugs. family history includes Alcohol abuse in her father and mother; Arthritis in her maternal grandmother; Asthma in her paternal aunt; Breast cancer in her  mother; Breast cancer (age of onset: 51) in her sister; Colon cancer in her paternal aunt and paternal grandfather; Irritable bowel syndrome in her sister; Lupus in her sister; Stroke in her father. Allergies  Allergen Reactions   Lamisil [Terbinafine] Hives   Wheat     Other reaction(s): Respiratory Distress   Ceclor [Cefaclor]     hives   Cephalosporins    Latex    Zoster Vaccine Live Rash    Review of Systems  Constitutional:  Negative for chills and fever.  HENT:  Negative for sinus pain.   Respiratory:  Positive for cough, shortness of breath and wheezing.   Cardiovascular:  Negative for chest pain.      Objective:     BP 128/66 (BP Location: Left Arm, Patient Position: Sitting, Cuff Size: Normal)   Pulse 77   Temp 98.6 F (37 C) (Oral)   Wt 126 lb 3.2 oz (57.2 kg)   SpO2 98%   BMI 21.00 kg/m  BP Readings from Last 3 Encounters:  07/15/23 128/66  06/04/23 136/70  05/13/23 130/64   Wt Readings from Last 3 Encounters:  07/15/23 126 lb 3.2 oz (57.2 kg)  06/04/23 127 lb (57.6 kg)  05/13/23 129 lb 12.8 oz (58.9 kg)  Physical Exam Vitals reviewed.  Constitutional:      General: She is not in acute distress.    Appearance: She is well-developed.  Cardiovascular:     Rate and Rhythm: Normal rate and regular rhythm.  Pulmonary:     Effort: Pulmonary effort is normal.     Breath sounds: Normal breath sounds. No decreased breath sounds, wheezing or rales.  Musculoskeletal:     Cervical back: Neck supple.  Lymphadenopathy:     Cervical: No cervical adenopathy.  Neurological:     Mental Status: She is alert.      No results found for any visits on 07/15/23.    The 10-year ASCVD risk score (Arnett DK, et al., 2019) is: 3.7%    Assessment & Plan:   Problem List Items Addressed This Visit   None Visit Diagnoses       Chronic cough    -  Primary   Relevant Orders   DG Chest 2 View     Please relates chronic cough really for over 2 months since  she had influenza back in late December.  She is aware of some wheezing and sensation of chest tightness which are temporary relieved with albuterol.  She does have past history of asthma which has been more mild intermittent but is currently more persistent Chest x-ray obtained today which shows no acute consolidations.  This will be over read by radiology.  -Continue albuterol as needed -Given frequency of wheezing and cough with her history of asthma start Symbicort 80 mg 2 puffs twice daily and reminded to rinse mouth after use. -Be in touch if she is not seeing improvement in her cough and wheezing over the next couple weeks  No follow-ups on file.    Evelena Peat, MD

## 2023-07-16 DIAGNOSIS — H2513 Age-related nuclear cataract, bilateral: Secondary | ICD-10-CM | POA: Diagnosis not present

## 2023-07-29 ENCOUNTER — Telehealth: Payer: Self-pay

## 2023-07-29 NOTE — Telephone Encounter (Signed)
 Copied from CRM [id number]. Topic: General - Call Back - No Documentation >> Jul 29, 2023  3:50 PM Isabell A wrote: Reason for CRM: Patient is returning a missed phone call from Dr.Burchette's nurse, states she doesn't know what its in regard to - advised if its not urgent to call back tomorrow.

## 2023-07-29 NOTE — Telephone Encounter (Signed)
 Please see result note

## 2023-09-10 ENCOUNTER — Encounter: Payer: Self-pay | Admitting: Podiatry

## 2023-09-10 ENCOUNTER — Ambulatory Visit (INDEPENDENT_AMBULATORY_CARE_PROVIDER_SITE_OTHER): Admitting: Podiatry

## 2023-09-10 ENCOUNTER — Ambulatory Visit (INDEPENDENT_AMBULATORY_CARE_PROVIDER_SITE_OTHER)

## 2023-09-10 DIAGNOSIS — M25571 Pain in right ankle and joints of right foot: Secondary | ICD-10-CM | POA: Diagnosis not present

## 2023-09-10 DIAGNOSIS — S82831A Other fracture of upper and lower end of right fibula, initial encounter for closed fracture: Secondary | ICD-10-CM | POA: Diagnosis not present

## 2023-09-14 NOTE — Progress Notes (Signed)
  Subjective:  Patient ID: Vicki [name], female    DOB: 1960-12-11,  MRN: [id number]  Chief Complaint  Patient presents with   Ankle Pain    RM#13 Right ankle pain was walking on uneven ground and twisted ankle has had injury to ankle in the past several years ago. Pain accurse when walking up stairs or when moves ankle to fast have been icing area.     Discussed the use of AI scribe software for clinical note transcription with the patient, who gave verbal consent to proceed.  History of Present Illness The patient, with a history of a previous right ankle fracture, presents with right ankle pain after a fall in [place], [place]. She reports stepping into a divot and falling, with her right ankle catching. The pain is described as sore, but not as severe as when she previously fractured the ankle. She has been icing the ankle for the first 24 hours post-injury and has been taking Tylenol for pain management. The patient is very active, walking daily and caring for two large dogs. She expresses concern about aggravating the injury if it is more than a sprain.      Objective:    Physical Exam General: AAO x3, NAD  Dermatological: Skin is warm, dry and supple bilateral.  There are no open sores, no preulcerative lesions, no rash or signs of infection present.  Vascular: Dorsalis Pedis artery and Posterior Tibial artery pedal pulses are 2/4 bilateral with immedate capillary fill time.  There is no pain with calf compression, swelling, warmth, erythema.   Neruologic: Grossly intact via light touch bilateral.    Musculoskeletal: On the right ankle there is tenderness palpation of the distal portion of the fibula directly on the bone.  There is tenderness on the anterior lateral ankle along the ATFL.  No pain on the peroneal tendon.  No pain to the Achilles tendon.  Clinically the tendons appear to be intact.  Gait: Unassisted, Nonantalgic.     No images are attached to the  encounter.    Results RADIOLOGY Right ankle X-ray: Swelling and a small avulsion fracture at the site of previous injury (09/10/2023).   Assessment:   1. Closed avulsion fracture of distal fibula, right, initial encounter      Plan:  Patient was evaluated and treated and all questions answered.  Assessment and Plan Assessment & Plan Right ankle avulsion fracture X-rays suggest a small avulsion fracture, consistent with a severe sprain. Conservative treatment recommended to prevent long-term instability. - Immobilize ankle with boot when walking. She has a boot already and advised to wear this.  - Apply ice to reduce inflammation. - Administer Tylenol for pain, avoid NSAIDs. - Follow-up in three weeks to reassess.   Return in about 3 weeks (around 10/01/2023) for right ankle injury, x-ray.   Charity Conch DPM

## 2023-10-01 ENCOUNTER — Ambulatory Visit (INDEPENDENT_AMBULATORY_CARE_PROVIDER_SITE_OTHER): Admitting: Podiatry

## 2023-10-01 ENCOUNTER — Ambulatory Visit (INDEPENDENT_AMBULATORY_CARE_PROVIDER_SITE_OTHER)

## 2023-10-01 DIAGNOSIS — E559 Vitamin D deficiency, unspecified: Secondary | ICD-10-CM

## 2023-10-01 DIAGNOSIS — S82831D Other fracture of upper and lower end of right fibula, subsequent encounter for closed fracture with routine healing: Secondary | ICD-10-CM | POA: Diagnosis not present

## 2023-10-01 DIAGNOSIS — S82831A Other fracture of upper and lower end of right fibula, initial encounter for closed fracture: Secondary | ICD-10-CM

## 2023-10-01 NOTE — Patient Instructions (Addendum)
 Ankle Sprain- start exercises in about 2 weeks  An ankle sprain is a stretch or tear in a ligament in your ankle. Ligaments are tissues that connect bones to each other.  An ankle sprain can happen when: The ankle rolls outward. This is called an inversion sprain. The ankle rolls inward. This is called an eversion sprain. What are the causes? An ankle sprain is caused by rolling or twisting your ankle. What increases the risk? You are more likely to get an ankle sprain if you play sports. What are the signs or symptoms?  Pain in your ankle. Swelling. Bruising. Bruises may form right after you sprain your ankle or 1-2 days later. Trouble standing or walking. How is this treated? An ankle sprain may be treated with: A brace or splint. This is used to keep the ankle from moving until it heals. An elastic bandage (dressing). This is used to support the ankle. Crutches. Pain medicine. Surgery. This may be needed if the sprain is very bad. Physical therapy. This can help you move your ankle better. Follow these instructions at home: If you have a brace or a splint that can be taken off: Wear the brace or splint as told by your doctor. Take it off only as told by your doctor. Check the skin around the brace or splint every day. Tell your doctor if you see problems. Loosen the brace or splint if your toes: Tingle. Become numb. Turn cold and blue. Keep the brace or splint clean and dry. If the brace or splint is not waterproof: Do not let it get wet. Cover it with a watertight covering when you take a bath or a shower. If you have an elastic dressing: Take it off to shower or bathe. Adjust it if it feels too tight. Loosen the dressing if your foot: Tingles. Becomes numb. Turns cold and blue. Managing pain, stiffness, and swelling If told, put ice on the affected area. If you have a removable brace or splint, take it off as told by your doctor. Put ice in a plastic bag. Place a  towel between your skin and the bag. Leave the ice on for 20 minutes, 2-3 times a day. If your skin turns bright red, take off the ice right away to prevent skin damage. The risk of damage is higher if you cannot feel pain, heat, or cold. Move your toes often. Raise your ankle above the level of your heart while you are sitting or lying down. General instructions Take over-the-counter and prescription medicines only as told by your doctor. Do not smoke or use any products that contain nicotine or tobacco. If you need help quitting, ask your doctor. Rest your ankle. Use crutches to support your body weight. Do not use your injured leg to support your body weight until your doctor says that you can. Ask your doctor when it is safe to drive if you have a brace or splint on your ankle. Contact a doctor if: Your bruises or swelling get worse all of a sudden. Your pain does not get better after you take medicine. Get help right away if: Your foot or toes are numb or blue. You have very bad pain that gets worse. This information is not intended to replace advice given to you by your health care provider. Make sure you discuss any questions you have with your health care provider. Document Revised: 02/14/2022 Document Reviewed: 02/14/2022 Elsevier Patient Education  2024 [employer].

## 2023-10-02 ENCOUNTER — Encounter: Payer: Self-pay | Admitting: Podiatry

## 2023-10-02 LAB — VITAMIN D 25 HYDROXY (VIT D DEFICIENCY, FRACTURES): Vit D, 25-Hydroxy: 36.9 ng/mL (ref 30.0–100.0)

## 2023-10-02 NOTE — Progress Notes (Signed)
  Subjective:  Patient ID: Vicki [name], female    DOB: Sep 02, 1960,  MRN: [id number]  Chief Complaint  Patient presents with   Ankle Injury    RM#11 Follow up on ankle injury patient states feeling better no pain has been wearing boot.Patient has no concerns at this time.     History of Present Illness 63 year old female presents the office with above concerns.  States that she is feeling much better but she still has some discomfort she notes that she is not 100% yet.  She has been using the cam boot the majority of time.  She goes without that for very short time at home but otherwise she has been doing well.  No recent injury or changes otherwise since I saw her last.    Objective:    Physical Exam General: AAO x3, NAD  Dermatological: Skin is warm, dry and supple bilateral.  There are no open sores, no preulcerative lesions, no rash or signs of infection present.  Vascular: Dorsalis Pedis artery and Posterior Tibial artery pedal pulses are 2/4 bilateral with immedate capillary fill time.  There is no pain with calf compression, swelling, warmth, erythema.   Neruologic: Grossly intact via light touch bilateral.    Musculoskeletal: On the right ankle there is no significant tenderness palpation of the distal portion of the fibula directly on the bone.  No significant tenderness noted on the anterior lateral ankle along the ATFL.  No pain on the peroneal tendon.  No pain to the Achilles tendon.  Clinically the tendons appear to be intact.  Gait: Unassisted, Nonantalgic.     No images are attached to the encounter.    Results RADIOLOGY Right ankle X-ray: Swelling and a small avulsion fracture noted along the distal fibula.  There is no evidence of acute fracture otherwise.   Assessment:   Closed fracture of distal fibula, right, subsequent encounter  Plan:  Patient was evaluated and treated and all questions answered.  Assessment and Plan Assessment & Plan Right  ankle avulsion fracture -I would still recommend continue with immobilization for another 2 weeks.  That time if her symptoms have improved she can gradually start to transition to regular shoe as tolerated.  That time also discussed starting some rehab, stretching exercises and a worksheet was provided today.  Continue to ice, elevate.  Should symptoms still persist at that point recommend continue immobilization in the cam boot.   - Will check vitamin D  level.   Return in about 3 weeks (around 10/22/2023) for fracture, x-ray.  Charity Conch DPM

## 2023-10-07 DIAGNOSIS — G43809 Other migraine, not intractable, without status migrainosus: Secondary | ICD-10-CM | POA: Diagnosis not present

## 2023-10-07 DIAGNOSIS — Z1330 Encounter for screening examination for mental health and behavioral disorders, unspecified: Secondary | ICD-10-CM | POA: Diagnosis not present

## 2023-10-25 ENCOUNTER — Encounter: Payer: Self-pay | Admitting: Podiatry

## 2023-10-25 ENCOUNTER — Ambulatory Visit (INDEPENDENT_AMBULATORY_CARE_PROVIDER_SITE_OTHER)

## 2023-10-25 ENCOUNTER — Ambulatory Visit (INDEPENDENT_AMBULATORY_CARE_PROVIDER_SITE_OTHER): Admitting: Podiatry

## 2023-10-25 DIAGNOSIS — S82831D Other fracture of upper and lower end of right fibula, subsequent encounter for closed fracture with routine healing: Secondary | ICD-10-CM | POA: Diagnosis not present

## 2023-10-25 NOTE — Patient Instructions (Addendum)
For instructions on how to put on your Tri-Lock Ankle Brace, please visit [email or web address]    Ankle Sprain, Phase I Rehab An ankle sprain is an injury to the tissues that connect bone to bone (ligaments) in your ankle. Ankle sprains can cause stiffness, loss of motion, and loss of strength. Ask your health care provider which exercises are safe for you. Do exercises exactly as told by your provider and adjust them as directed. It is normal to feel mild stretching, pulling, tightness, or discomfort as you do these exercises. Stop right away if you feel sudden pain or your pain gets worse. Do not begin these exercises until told by your provider. Stretching and range-of-motion exercises These exercises warm up your muscles and joints. They can improve the movement and flexibility of your lower leg and ankle. They also help to relieve pain and stiffness. Gastroc and soleus stretch This exercise is also called a calf stretch. It stretches the muscles in the back of the lower leg. These muscles are the gastrocnemius, or gastroc, and the soleus. Sit on the floor with your left / right leg extended. Loop a belt or towel around the ball of your left / right foot. The ball of your foot is on the walking surface, right under your toes. Keep your left / right ankle and foot relaxed and keep your knee straight. Use the belt or towel to pull your foot toward you. You should feel a gentle stretch behind your calf or knee in your gastroc muscle. Hold this position for __________ seconds, then release to the starting position. Repeat the exercise with your knee bent. You can put a pillow or a rolled bath towel under your knee to support it. You should feel a stretch deep in your calf in the soleus muscle or at your Achilles tendon. Repeat __________ times. Complete this exercise __________ times a day. Ankle alphabet  Sit with your left / right leg supported at the lower leg. Do not rest your foot on  anything. Make sure your foot has room to move freely. Think of your left / right foot as a paintbrush. Move your foot to trace each letter of the alphabet in the air. Keep your hip and knee still while you trace. Make the letters as large as you can without feeling discomfort. Trace every letter from A to Z. Repeat __________ times. Complete this exercise __________ times a day. Strengthening exercises These exercises build strength and endurance in your ankle and lower leg. Endurance is the ability to use your muscles for a long time, even after they get tired. Ankle dorsiflexion  Secure a rubber exercise band or tube to an object, such as a table leg, that will stay still when the band is pulled. Secure the other end around your left / right foot. Sit on the floor facing the object, with your left / right leg extended. The band or tube should be slightly tense when your foot is relaxed. Slowly bring your foot toward you, bringing the top of your foot toward your shin (dorsiflexion), and pulling the band tighter. Hold this position for __________ seconds. Slowly return your foot to the starting position. Repeat __________ times. Complete this exercise __________ times a day. Ankle plantar flexion  Sit on the floor with your left / right leg extended. Loop a rubber exercise tube or band around the ball of your left / right foot. The ball of your foot is on the walking surface, right under your toes.  Hold the ends of the band or tube in your hands. The band or tube should be slightly tense when your foot is relaxed. Slowly point your foot and toes downward to tilt the top of your foot away from your shin (plantar flexion). Hold this position for __________ seconds. Slowly return your foot to the starting position. Repeat __________ times. Complete this exercise __________ times a day. Ankle eversion  Sit on the floor with your legs straight out in front of you. Loop a rubber exercise  band or tube around the ball of your left / right foot. The ball of your foot is on the walking surface, right under your toes. Hold the ends of the band in your hands or secure the band to a stable object. The band or tube should be slightly tense when your foot is relaxed. Slowly push your foot outward, away from your other leg (eversion). Hold this position for __________ seconds. Slowly return your foot to the starting position. Repeat __________ times. Complete this exercise __________ times a day. This information is not intended to replace advice given to you by your health care provider. Make sure you discuss any questions you have with your health care provider. Document Revised: 03/07/2022 Document Reviewed: 03/07/2022 Elsevier Patient Education  2024 [employer].

## 2023-10-26 NOTE — Progress Notes (Signed)
  Subjective:  Patient ID: Vicki [name], female    DOB: 1961-01-10,  MRN: [id number]  Chief Complaint  Patient presents with   Foot Injury    RM#14 Follow up on right foot fracture patient doing well with no concerns other then further instructions a wearing boot.     History of Present Illness 63 year old female presents the office with above concerns.  States that she went back into a sneaker today but otherwise has been in the boot.  She states that she is feeling better.  She has occasional discomfort but no pain today.     Objective:    Physical Exam General: AAO x3, NAD  Dermatological: Skin is warm, dry and supple bilateral.  There are no open sores, no preulcerative lesions, no rash or signs of infection present.  Vascular: Dorsalis Pedis artery and Posterior Tibial artery pedal pulses are 2/4 bilateral with immedate capillary fill time.  There is no pain with calf compression, swelling, warmth, erythema.   Neruologic: Grossly intact via light touch bilateral.    Musculoskeletal: On the right ankle there is no tenderness palpation along the distal fibula in the area of the avulsion fracture.  No other areas of pinpoint tenderness noted.  Some slight edema but there is no erythema or warmth.  Ankle range of motion intact.  Gait: Unassisted, Nonantalgic.     Results RADIOLOGY Right ankle X-ray: Creased consolidation along the area of the avulsion fracture of the distal fibula.   Assessment:   Closed fracture of distal fibula, right, subsequent encounter  Plan:  Patient was evaluated and treated and all questions answered.  Assessment and Plan Assessment & Plan Right ankle avulsion fracture -Discussed that she can gradually start to transition to regular shoe as tolerated.  Tri-Lock ankle brace was dispensed to facilitate healing.  Continue ice, elevation to help with any residual swelling.  When she is in her regular shoe full-time without any pain she can start  to gradually increase exercise, activity. -Discussed rehab exercises. -I will see her back on as-needed basis but there is no improvement with any worsening to let us  know.  No follow-ups on file.  Charity Conch DPM

## 2023-11-08 ENCOUNTER — Other Ambulatory Visit: Payer: Self-pay | Admitting: Family Medicine

## 2024-01-03 ENCOUNTER — Encounter: Payer: Self-pay | Admitting: Family Medicine

## 2024-01-03 ENCOUNTER — Ambulatory Visit (INDEPENDENT_AMBULATORY_CARE_PROVIDER_SITE_OTHER): Admitting: Family Medicine

## 2024-01-03 VITALS — BP 120/60 | HR 73 | Temp 98.4°F | Wt 130.8 lb

## 2024-01-03 DIAGNOSIS — M159 Polyosteoarthritis, unspecified: Secondary | ICD-10-CM

## 2024-01-03 MED ORDER — CELECOXIB 200 MG PO CAPS: 200.0000 mg | ORAL_CAPSULE | Freq: Two times a day (BID) | ORAL | 2 refills | Status: AC

## 2024-01-03 NOTE — Progress Notes (Signed)
 Established Patient Office Visit  Subjective   Patient ID: Vicki [name], female    DOB: [birth year]  Age: 63 y.o. MRN: [id number]  Chief Complaint  Patient presents with   Arthritis         HPI   Vicki [name] is seen with arthritis complaints.  She suspects this is osteoarthritis.  She has definite involvement her hands including DIP PIP joints but also ankles, knees, hips to some extent.  Particularly after periods of inactivity she has increased stiffness and soreness when she gets up to move.  Her mom had significant osteoarthritis as well.  She is tried Aleve and Motrin but has had some stomach upset occasionally.  She takes Tylenol but gets very little relief with that.  Previously took meloxicam  but did not see much benefit from that.  She has not seen any signs of active inflammation such as visible joint swelling, redness, warmth.  Past Medical History:  Diagnosis Date   Allergy    Anxiety    hx ?panic disorder   Asthma    Celiac disease    Chronic gastritis    GERD (gastroesophageal reflux disease)    Hyperplastic colon polyp    Migraines    chronic   Schatzki's ring    Skin cancer    chin area   Thyroid  disease    Past Surgical History:  Procedure Laterality Date   ABLATION  2012   CESAREAN SECTION  1984   COLONOSCOPY  2011   DILATION AND CURETTAGE OF UTERUS  2012   POLYPECTOMY     TONSILLECTOMY AND ADENOIDECTOMY  1964   TUBAL LIGATION  1998   UPPER GASTROINTESTINAL ENDOSCOPY  2011,1998    reports that she has never smoked. She has never used smokeless tobacco. She reports that she does not drink alcohol and does not use drugs. family history includes Alcohol abuse in her father and mother; Arthritis in her maternal grandmother; Asthma in her paternal aunt; Breast cancer in her mother; Breast cancer (age of onset: 37) in her sister; Colon cancer in her paternal aunt and paternal grandfather; Irritable bowel syndrome in her sister; Lupus in her sister; Stroke in  her father. Allergies  Allergen Reactions   Lamisil [Terbinafine] Hives   Wheat     Other reaction(s): Respiratory Distress   Ceclor [Cefaclor]     hives   Cephalosporins    Latex    Zoster Vaccine Live Rash    Review of Systems  Constitutional:  Negative for fever.  Respiratory:  Negative for shortness of breath.   Cardiovascular:  Negative for chest pain.  Musculoskeletal:  Positive for joint pain.      Objective:     BP 120/60   Pulse 73   Temp 98.4 F (36.9 C) (Oral)   Wt 130 lb 12.8 oz (59.3 kg)   SpO2 99%   BMI 21.77 kg/m    Physical Exam Vitals reviewed.  Constitutional:      General: She is not in acute distress.    Appearance: She is not ill-appearing.  Cardiovascular:     Rate and Rhythm: Normal rate and regular rhythm.  Pulmonary:     Effort: Pulmonary effort is normal.     Breath sounds: Normal breath sounds.  Musculoskeletal:     Comments: She has some nodules on the hands DIP and PIP joints consistent with osteoarthritis.  Otherwise, no evidence for any warmth, erythema, or visible joint swelling      No results found  for any visits on 01/03/24.    The ASCVD Risk score (Arnett DK, et al., 2019) failed to calculate for the following reasons:   Cannot find a previous HDL lab   Cannot find a previous total cholesterol lab    Assessment & Plan:   Osteoarthritis involving multiple joints.  She notices increased stiffness and pain after prolonged periods of inactivity.  We suggested the following  -Consider trial of Celebrex  200 mg once or twice daily - May supplement with Tylenol as needed - Discussed importance of ongoing exercise --particularly consider some increased resistance training  Vicki Scarlet, MD

## 2024-01-09 DIAGNOSIS — D1801 Hemangioma of skin and subcutaneous tissue: Secondary | ICD-10-CM | POA: Diagnosis not present

## 2024-01-09 DIAGNOSIS — L814 Other melanin hyperpigmentation: Secondary | ICD-10-CM | POA: Diagnosis not present

## 2024-01-09 DIAGNOSIS — L821 Other seborrheic keratosis: Secondary | ICD-10-CM | POA: Diagnosis not present

## 2024-01-09 DIAGNOSIS — L578 Other skin changes due to chronic exposure to nonionizing radiation: Secondary | ICD-10-CM | POA: Diagnosis not present

## 2024-03-29 ENCOUNTER — Other Ambulatory Visit: Payer: Self-pay | Admitting: Family Medicine

## 2024-03-30 ENCOUNTER — Ambulatory Visit: Payer: Self-pay

## 2024-03-30 ENCOUNTER — Telehealth (INDEPENDENT_AMBULATORY_CARE_PROVIDER_SITE_OTHER): Admitting: Family Medicine

## 2024-03-30 VITALS — Temp 102.0°F

## 2024-03-30 DIAGNOSIS — U071 COVID-19: Secondary | ICD-10-CM | POA: Diagnosis not present

## 2024-03-30 MED ORDER — NIRMATRELVIR/RITONAVIR (PAXLOVID)TABLET: 3.0000 | ORAL_TABLET | Freq: Two times a day (BID) | ORAL | 0 refills | Status: AC

## 2024-03-30 NOTE — Progress Notes (Signed)
 Patient ID: Vicki [name], female   DOB: [birth year], 63 y.o.   MRN: [id number]   Virtual Visit via Video Note  I connected with Vicki [name] on 03/30/24 at 10:00 AM EST by a video enabled telemedicine application and verified that I am speaking with the correct person using two identifiers.  Location patient: home Location provider:work or home office Persons participating in the virtual visit: patient, provider  I discussed the limitations of evaluation and management by telemedicine and the availability of in person appointments. The patient expressed understanding and agreed to proceed.   HPI: Vicki and her husband just got back last night from 2-week trip to [place].  She states Saturday she had some mild congestion and by yesterday had fever 102 with headaches, cough, congestion.  She has had increased malaise.  Has also had significant body aches.  Home COVID test positive.  No nausea, vomiting, or diarrhea.  Her husband had been vaccinated and has not had COVID symptoms thus far.  Vicki states her O2 sats this morning were 95% but pulse 120 but again had fever 102.  Does have history of mild intermittent asthma.  Otherwise fairly healthy.   ROS: See pertinent positives and negatives per HPI.  Past Medical History:  Diagnosis Date   Allergy    Anxiety    hx ?panic disorder   Asthma    Celiac disease    Chronic gastritis    GERD (gastroesophageal reflux disease)    Hyperplastic colon polyp    Migraines    chronic   Schatzki's ring    Skin cancer    chin area   Thyroid  disease     Past Surgical History:  Procedure Laterality Date   ABLATION  2012   CESAREAN SECTION  1984   COLONOSCOPY  2011   DILATION AND CURETTAGE OF UTERUS  2012   POLYPECTOMY     TONSILLECTOMY AND ADENOIDECTOMY  1964   TUBAL LIGATION  1998   UPPER GASTROINTESTINAL ENDOSCOPY  2011,1998    Family History  Problem Relation Age of Onset   Alcohol abuse Mother    Breast cancer Mother    Alcohol  abuse Father    Stroke Father    Breast cancer Sister 91   Lupus Sister    Irritable bowel syndrome Sister    Arthritis Maternal Grandmother    Colon cancer Paternal Grandfather    Asthma Paternal Aunt    Colon cancer Paternal Aunt    Heart disease Neg Hx    Esophageal cancer Neg Hx    Stomach cancer Neg Hx    Rectal cancer Neg Hx     SOCIAL HX: Non-smoker   Current Outpatient Medications:    albuterol  (VENTOLIN  HFA) 108 (90 Base) MCG/ACT inhaler, Inhale 2 puffs into the lungs every 4 (four) hours as needed for wheezing or shortness of breath., Disp: 8.5 each, Rfl: 1   ALPRAZolam  (XANAX ) 0.25 MG tablet, TAKE 1-2 TABLETS BY MOUTH ONE HOUR PRIOR TO FLYING., Disp: 20 tablet, Rfl: 0   celecoxib  (CELEBREX ) 200 MG capsule, Take 1 capsule (200 mg total) by mouth 2 (two) times daily., Disp: 60 capsule, Rfl: 2   diphenoxylate -atropine  (LOMOTIL ) 2.5-0.025 MG tablet, TAKE 1 TABLET BY MOUTH 4 TIMES A DAY AS NEEDED FOR DIARRHEA IR LOOE STOOL, Disp: 120 tablet, Rfl: 1   estradiol (ESTRACE) 0.1 MG/GM vaginal cream, Place vaginally., Disp: , Rfl:    meclizine  (ANTIVERT ) 25 MG tablet, Take 1 tablet (25 mg total) by mouth 3 (  three) times daily as needed for dizziness., Disp: 30 tablet, Rfl: 0   mometasone  (NASONEX ) 50 MCG/ACT nasal spray, USE 2 SPRAYS IN EACH NOSTRIL EVERY DAY, Disp: 51 each, Rfl: 0   Multiple Vitamins-Minerals (WOMENS MULTIVITAMIN PLUS PO), Take by mouth daily., Disp: , Rfl:    nirmatrelvir /ritonavir  (PAXLOVID ) 20 x 150 MG & 10 x 100MG  TABS, Take 3 tablets by mouth 2 (two) times daily for 5 days. (Take nirmatrelvir  150 mg two tablets twice daily for 5 days and ritonavir  100 mg one tablet twice daily for 5 days) Patient GFR is 80, Disp: 30 tablet, Rfl: 0   PARoxetine  (PAXIL ) 10 MG tablet, TAKE 1 TABLET BY MOUTH EVERY DAY, Disp: 90 tablet, Rfl: 1   pseudoephedrine (SUDAFED) 30 MG tablet, Take by mouth., Disp: , Rfl:    SUMAtriptan  (IMITREX ) 100 MG tablet, TAKE 1 TABLET BY MOUTH EVERY 2  HOURS AS NEEDED FOR MIGRAINE, Disp: 9 tablet, Rfl: 1   SYMBICORT  80-4.5 MCG/ACT inhaler, INHALE 2 PUFFS INTO THE LUNGS TWICE A DAY, Disp: 30.6 each, Rfl: 1  EXAM:  VITALS per patient if applicable:  GENERAL: alert, oriented, appears well and in no acute distress  HEENT: atraumatic, conjunttiva clear, no obvious abnormalities on inspection of external nose and ears  NECK: normal movements of the head and neck  LUNGS: on inspection no signs of respiratory distress, breathing rate appears normal, no obvious gross SOB, gasping or wheezing  CV: no obvious cyanosis  MS: moves all visible extremities without noticeable abnormality  PSYCH/NEURO: pleasant and cooperative, no obvious depression or anxiety, speech and thought processing grossly intact  ASSESSMENT AND PLAN:  Discussed the following assessment and plan:  COVID.  No respiratory distress.  Offered antivirals with Paxlovid  which she would like to proceed with.  She has history of normal renal function with last GFR of 80.  Continue plenty of fluids and rest and over-the-counter analgesics as needed.  Follow-up for any persistent or worsening symptoms.     I discussed the assessment and treatment plan with the patient. The patient was provided an opportunity to ask questions and all were answered. The patient agreed with the plan and demonstrated an understanding of the instructions.   The patient was advised to call back or seek an in-person evaluation if the symptoms worsen or if the condition fails to improve as anticipated.     Wolm Scarlet, MD

## 2024-03-30 NOTE — Telephone Encounter (Signed)
 FYI Only or Action Required?: Action required by provider: request for appointment.  Patient was last seen in primary care on 01/03/2024 by Micheal Wolm ORN, MD.  Called Nurse Triage reporting Covid Positive.  Symptoms began several days ago.  Interventions attempted: Rest, hydration, or home remedies.  Symptoms are: gradually worsening. Covid positive today, asking about Paxlovid . Has fever, chills, body aches, cough.  Triage Disposition: See HCP Within 4 Hours (Or PCP Triage)  Patient/caregiver understands and will follow disposition?: Yes   Copied from CRM [id number]. Topic: Clinical - Red Word Triage >> Mar 30, 2024  7:34 AM Harlene ORN wrote: Red Word that prompted transfer to Nurse Triage: patient was in [place] for two weeks. Tested positive this morning for Covid and has a 102 degree fever. Answer Assessment - Initial Assessment Questions 1. SYMPTOMS: What is your main symptom or concern? (e.g., cough, fever, shortness of breath, muscle aches)     Fever, body aches 2. ONSET: When did the symptoms start?      Friday 3. COUGH: Do you have a cough? If Yes, ask: How bad is the cough?       Mild,productive, yellow drainage 4. FEVER: Do you have a fever? If Yes, ask: What is your temperature, how was it measured, and when did it start?     102 5. BREATHING DIFFICULTY: Are you having any difficulty breathing? (e.g., normal; shortness of breath, wheezing, unable to speak)      no 6. BETTER-SAME-WORSE: Are you getting better, staying the same or getting worse compared to yesterday?  If getting worse, ask, In what way?     worse 7. OTHER SYMPTOMS: Do you have any other symptoms?  (e.g., chills, fatigue, headache, loss of smell or taste, muscle pain, sore throat)     Body aches 8. COVID-19 DIAGNOSIS: How do you know that you have COVID? (e.g., positive lab test or self-test, diagnosed by doctor or NP/PA, symptoms after exposure).     Hoe test 9. COVID-19 EXPOSURE:  Was there any known exposure to COVID before the symptoms began?      N/a 10. COVID-19 VACCINE: Have you had the COVID-19 vaccine? If Yes, ask: When did you last get it?       N/a 11. HIGH RISK DISEASE: Do you have any chronic medical problems? (e.g., asthma, heart or lung disease, weak immune system, obesity, etc.)       no 12. PREGNANCY: Is there any chance you are pregnant? When was your last menstrual period?       no 13. O2 SATURATION MONITOR:  Do you use an oxygen saturation monitor (pulse oximeter) at home? If Yes, ask What is your reading (oxygen level) today? What is your usual oxygen saturation reading? (e.g., 95%)       no  Protocols used: COVID-19 - Diagnosed or Suspected-A-AH  Reason for Disposition  MILD difficulty breathing (e.g., minimal/no SOB at rest, SOB with walking, pulse < 100)  Answer Assessment - Initial Assessment Questions 1. SYMPTOMS: What is your main symptom or concern? (e.g., cough, fever, shortness of breath, muscle aches)     Fever, body aches 2. ONSET: When did the symptoms start?      Friday 3. COUGH: Do you have a cough? If Yes, ask: How bad is the cough?       Mild,productive, yellow drainage 4. FEVER: Do you have a fever? If Yes, ask: What is your temperature, how was it measured, and when did it start?  102 5. BREATHING DIFFICULTY: Are you having any difficulty breathing? (e.g., normal; shortness of breath, wheezing, unable to speak)      no 6. BETTER-SAME-WORSE: Are you getting better, staying the same or getting worse compared to yesterday?  If getting worse, ask, In what way?     worse 7. OTHER SYMPTOMS: Do you have any other symptoms?  (e.g., chills, fatigue, headache, loss of smell or taste, muscle pain, sore throat)     Body aches 8. COVID-19 DIAGNOSIS: How do you know that you have COVID? (e.g., positive lab test or self-test, diagnosed by doctor or NP/PA, symptoms after exposure).     Hoe test 9.  COVID-19 EXPOSURE: Was there any known exposure to COVID before the symptoms began?      N/a 10. COVID-19 VACCINE: Have you had the COVID-19 vaccine? If Yes, ask: When did you last get it?       N/a 11. HIGH RISK DISEASE: Do you have any chronic medical problems? (e.g., asthma, heart or lung disease, weak immune system, obesity, etc.)       no 12. PREGNANCY: Is there any chance you are pregnant? When was your last menstrual period?       no 13. O2 SATURATION MONITOR:  Do you use an oxygen saturation monitor (pulse oximeter) at home? If Yes, ask What is your reading (oxygen level) today? What is your usual oxygen saturation reading? (e.g., 95%)       no  Protocols used: COVID-19 - Diagnosed or Suspected-A-AH

## 2024-04-16 DIAGNOSIS — L821 Other seborrheic keratosis: Secondary | ICD-10-CM | POA: Diagnosis not present

## 2024-04-21 DIAGNOSIS — G43909 Migraine, unspecified, not intractable, without status migrainosus: Secondary | ICD-10-CM | POA: Diagnosis not present

## 2024-04-21 DIAGNOSIS — Z1212 Encounter for screening for malignant neoplasm of rectum: Secondary | ICD-10-CM | POA: Diagnosis not present

## 2024-04-21 DIAGNOSIS — Z780 Asymptomatic menopausal state: Secondary | ICD-10-CM | POA: Diagnosis not present

## 2024-04-21 DIAGNOSIS — K580 Irritable bowel syndrome with diarrhea: Secondary | ICD-10-CM | POA: Diagnosis not present

## 2024-04-21 DIAGNOSIS — Z01419 Encounter for gynecological examination (general) (routine) without abnormal findings: Secondary | ICD-10-CM | POA: Diagnosis not present

## 2024-04-21 DIAGNOSIS — Z1231 Encounter for screening mammogram for malignant neoplasm of breast: Secondary | ICD-10-CM | POA: Diagnosis not present

## 2024-05-12 ENCOUNTER — Ambulatory Visit: Admitting: Podiatry

## 2024-06-01 ENCOUNTER — Ambulatory Visit: Payer: Self-pay

## 2024-06-01 ENCOUNTER — Telehealth: Payer: Self-pay

## 2024-06-01 ENCOUNTER — Other Ambulatory Visit (HOSPITAL_COMMUNITY): Payer: Self-pay

## 2024-06-01 NOTE — Telephone Encounter (Signed)
 FYI Only or Action Required?: FYI only for provider: appointment scheduled on 06/02/2024 at 10am with Dr Wolm Scarlet.  Patient was last seen in primary care on 03/30/2024 by Scarlet Wolm ORN, MD.  Called Nurse Triage reporting Facial Pain.  Symptoms began 8 days.  Interventions attempted: OTC medications: Tylenol, Sudafed, Nasonex  and Rest, hydration, or home remedies.  Symptoms are: gradually worsening.  Triage Disposition: See Physician Within 24 Hours  Patient/caregiver understands and will follow disposition?: Yes      Copied from CRM #[id number]. Topic: Appointments - Appointment Scheduling >> Jun 01, 2024 10:03 AM Kevelyn M wrote: Day 8 of sinus infection-nasal drainage-green/cough/scratchy throat/sinus pressure/hoarsness Reason for Disposition  Earache  Answer Assessment - Initial Assessment Questions Sinus pressure/pain, green mucous, sore throat, hoarseness, slight cough off and on, left ear feeling full/congested at times x 8 days  Tested negative for flu and covid with home test Scratchy throat, hoarse voice, green mucous Patient states her lungs are clear Left ear feels full/congested off and on Patient denies any known fevers or any shortness of breath Patient has taken Tylenol, Sudafed, Nasonex   Patient is advised to call us  back if anything changes or with any further questions/concerns. Patient is advised that if anything worsens to go to the Emergency Room. Patient verbalized understanding.   2. ONSET: When did the sinus pain start?  (e.g., hours, days)      8 days ago 3. SEVERITY: How bad is the pain?   (Scale 0-10; or none, mild, moderate or severe)     --- 4. RECURRENT SYMPTOM: Have you ever had sinus problems before? If Yes, ask: When was the last time? and What happened that time?      ---- 5. NASAL CONGESTION: Is the nose blocked? If Yes, ask: Can you open it or must you breathe through your mouth?     --- 6. NASAL DISCHARGE: Do  you have discharge from your nose? If so ask, What color?     --- 7. FEVER: Do you have a fever? If Yes, ask: What is it, how was it measured, and when did it start?      ----- 8. OTHER SYMPTOMS: Do you have any other symptoms? (e.g., sore throat, cough, earache, difficulty breathing)     ------  Protocols used: Sinus Pain or Congestion-A-AH

## 2024-06-01 NOTE — Telephone Encounter (Signed)
 Patient is on need of PA for Ubrevly 100 MG  Key: [id number] Last name: Vicki [name] DOB: 13-Oct-1960

## 2024-06-02 ENCOUNTER — Ambulatory Visit (INDEPENDENT_AMBULATORY_CARE_PROVIDER_SITE_OTHER): Admitting: Family Medicine

## 2024-06-02 ENCOUNTER — Other Ambulatory Visit (HOSPITAL_COMMUNITY): Payer: Self-pay

## 2024-06-02 ENCOUNTER — Telehealth: Payer: Self-pay

## 2024-06-02 ENCOUNTER — Encounter: Payer: Self-pay | Admitting: Family Medicine

## 2024-06-02 VITALS — BP 108/60 | HR 79 | Temp 97.7°F | Wt 128.8 lb

## 2024-06-02 DIAGNOSIS — J0100 Acute maxillary sinusitis, unspecified: Secondary | ICD-10-CM

## 2024-06-02 MED ORDER — PREDNISONE 20 MG PO TABS: ORAL_TABLET | ORAL | 0 refills | Status: AC

## 2024-06-02 MED ORDER — DOXYCYCLINE HYCLATE 100 MG PO CAPS: 100.0000 mg | ORAL_CAPSULE | Freq: Two times a day (BID) | ORAL | 0 refills | Status: AC

## 2024-06-02 NOTE — Telephone Encounter (Signed)
 Pharmacy Patient Advocate Encounter   Received notification from Pt Calls Messages that prior authorization for Ubrelvy  100 is required/requested.   Insurance verification completed.   The patient is insured through [facility].   Per test claim: PA required; PA submitted to above mentioned insurance via Latent Key/confirmation #/EOC [id number] Status is pending

## 2024-06-02 NOTE — Telephone Encounter (Signed)
 Noted

## 2024-06-02 NOTE — Progress Notes (Signed)
 "  Established Patient Office Visit  Subjective   Patient ID: Vicki [name], female    DOB: [birth year]  Age: 64 y.o. MRN: [id number]  Chief Complaint  Patient presents with   Cough   Nasal Congestion   Headache    HPI   Vicki [name] is seen with progressive sinusitis symptoms.  Started with cold-like symptoms over 10 days ago.  Now has cough productive of green sputum.  She has had some progressive left frontal and left maxillary sinus pressure and pain.  Has developed some laryngitis.  Initial sinus drainage was clear and now purulent.  She has tried Sudafed and Tylenol with minimal relief.  Is having daily headaches.  Increased malaise.  Has been prone to sinusitis in the past.  Has tolerated Augmentin  and doxycycline  in the past.  Has done well with combination with short-term prednisone  concomitantly.  Past Medical History:  Diagnosis Date   Allergy    Anxiety    hx ?panic disorder   Asthma    Celiac disease    Chronic gastritis    GERD (gastroesophageal reflux disease)    Hyperplastic colon polyp    Migraines    chronic   Schatzki's ring    Skin cancer    chin area   Thyroid  disease    Past Surgical History:  Procedure Laterality Date   ABLATION  2012   CESAREAN SECTION  1984   COLONOSCOPY  2011   DILATION AND CURETTAGE OF UTERUS  2012   POLYPECTOMY     TONSILLECTOMY AND ADENOIDECTOMY  1964   TUBAL LIGATION  1998   UPPER GASTROINTESTINAL ENDOSCOPY  2011,1998    reports that she has never smoked. She has never used smokeless tobacco. She reports that she does not drink alcohol and does not use drugs. family history includes Alcohol abuse in her father and mother; Arthritis in her maternal grandmother; Asthma in her paternal aunt; Breast cancer in her mother; Breast cancer (age of onset: 70) in her sister; Colon cancer in her paternal aunt and paternal grandfather; Irritable bowel syndrome in her sister; Lupus in her sister; Stroke in her father. Allergies[1]  Review of  Systems  Constitutional:  Negative for chills and fever.  HENT:  Positive for congestion and sinus pain.   Respiratory:  Positive for cough and sputum production.       Objective:     BP 108/60   Pulse 79   Temp 97.7 F (36.5 C) (Oral)   Wt 128 lb 12.8 oz (58.4 kg)   SpO2 97%   BMI 21.43 kg/m  BP Readings from Last 3 Encounters:  06/02/24 108/60  01/03/24 120/60  07/15/23 128/66   Wt Readings from Last 3 Encounters:  06/02/24 128 lb 12.8 oz (58.4 kg)  01/03/24 130 lb 12.8 oz (59.3 kg)  07/15/23 126 lb 3.2 oz (57.2 kg)      Physical Exam Vitals reviewed.  Constitutional:      General: She is not in acute distress.    Appearance: She is not ill-appearing.  Cardiovascular:     Rate and Rhythm: Normal rate and regular rhythm.  Pulmonary:     Effort: Pulmonary effort is normal.     Breath sounds: Normal breath sounds. No wheezing or rales.  Musculoskeletal:     Cervical back: Neck supple.  Neurological:     Mental Status: She is alert.      No results found for any visits on 06/02/24.    The ASCVD Risk score (  Arnett DK, et al., 2019) failed to calculate for the following reasons:   Cannot find a previous HDL lab   Cannot find a previous total cholesterol lab   * - Cholesterol units were assumed    Assessment & Plan:   Vicki [name] is seen with progressive left frontal and left maxillary sinusitis symptoms.  Symptoms are getting worse over 10 days into illness.  She is having some purulent discharge along with more frequent headaches.  Start doxycycline  100 mg twice daily for 10 days.  Stay well-hydrated.  Also wrote for limited prednisone  20 mg 2 tablets daily for 5 days.  Follow-up for any persistent or worsening symptoms.   Vicki Scarlet, MD     [1]  Allergies Allergen Reactions   Lamisil [Terbinafine] Hives   Wheat     Other reaction(s): Respiratory Distress   Ceclor [Cefaclor]     hives   Cephalosporins    Latex    Zoster Vaccine Live Rash   "

## 2024-06-22 ENCOUNTER — Other Ambulatory Visit (HOSPITAL_COMMUNITY): Payer: Self-pay
